# Patient Record
Sex: Female | Born: 1955 | Race: White | Hispanic: No | Smoking: Former smoker
Health system: Southern US, Community
[De-identification: ages and names within clinical notes are randomized; demographics above are authoritative.]

## PROBLEM LIST (undated history)

## (undated) DIAGNOSIS — K589 Irritable bowel syndrome without diarrhea: Secondary | ICD-10-CM

## (undated) DIAGNOSIS — Z8719 Personal history of other diseases of the digestive system: Secondary | ICD-10-CM

## (undated) DIAGNOSIS — F341 Dysthymic disorder: Secondary | ICD-10-CM

## (undated) DIAGNOSIS — K219 Gastro-esophageal reflux disease without esophagitis: Secondary | ICD-10-CM

## (undated) DIAGNOSIS — Z86711 Personal history of pulmonary embolism: Secondary | ICD-10-CM

## (undated) DIAGNOSIS — M1990 Unspecified osteoarthritis, unspecified site: Secondary | ICD-10-CM

## (undated) DIAGNOSIS — G8929 Other chronic pain: Secondary | ICD-10-CM

## (undated) DIAGNOSIS — F17200 Nicotine dependence, unspecified, uncomplicated: Secondary | ICD-10-CM

## (undated) DIAGNOSIS — J449 Chronic obstructive pulmonary disease, unspecified: Secondary | ICD-10-CM

## (undated) HISTORY — DX: Dysthymic disorder: F34.1

## (undated) HISTORY — DX: Other chronic pain: G89.29

## (undated) HISTORY — DX: Irritable bowel syndrome, unspecified: K58.9

## (undated) HISTORY — DX: Unspecified osteoarthritis, unspecified site: M19.90

## (undated) HISTORY — DX: Personal history of pulmonary embolism: Z86.711

## (undated) HISTORY — DX: Personal history of other diseases of the digestive system: Z87.19

## (undated) HISTORY — PX: CERVICAL SPINE SURGERY: SHX589

## (undated) HISTORY — DX: Chronic obstructive pulmonary disease, unspecified: J44.9

## (undated) HISTORY — DX: Gastro-esophageal reflux disease without esophagitis: K21.9

---

## 2000-01-07 ENCOUNTER — Encounter: Payer: Self-pay | Admitting: Family Medicine

## 2000-01-26 ENCOUNTER — Encounter: Admission: RE | Admit: 2000-01-26 | Discharge: 2000-01-26 | Payer: Self-pay | Admitting: Family Medicine

## 2000-01-26 ENCOUNTER — Encounter: Payer: Self-pay | Admitting: Family Medicine

## 2000-02-14 ENCOUNTER — Emergency Department (HOSPITAL_COMMUNITY): Admission: EM | Admit: 2000-02-14 | Discharge: 2000-02-14 | Payer: Self-pay | Admitting: Emergency Medicine

## 2000-02-15 ENCOUNTER — Encounter: Payer: Self-pay | Admitting: Emergency Medicine

## 2000-02-25 ENCOUNTER — Encounter: Payer: Self-pay | Admitting: Urology

## 2000-02-25 ENCOUNTER — Encounter: Admission: RE | Admit: 2000-02-25 | Discharge: 2000-02-25 | Payer: Self-pay | Admitting: Urology

## 2000-02-26 ENCOUNTER — Encounter: Payer: Self-pay | Admitting: Urology

## 2000-02-26 ENCOUNTER — Ambulatory Visit (HOSPITAL_COMMUNITY): Admission: RE | Admit: 2000-02-26 | Discharge: 2000-02-26 | Payer: Self-pay | Admitting: Urology

## 2000-10-01 ENCOUNTER — Encounter (INDEPENDENT_AMBULATORY_CARE_PROVIDER_SITE_OTHER): Payer: Self-pay | Admitting: Specialist

## 2000-10-01 ENCOUNTER — Other Ambulatory Visit: Admission: RE | Admit: 2000-10-01 | Discharge: 2000-10-01 | Payer: Self-pay | Admitting: *Deleted

## 2000-10-21 ENCOUNTER — Other Ambulatory Visit: Admission: RE | Admit: 2000-10-21 | Discharge: 2000-10-21 | Payer: Self-pay | Admitting: *Deleted

## 2001-01-17 ENCOUNTER — Emergency Department (HOSPITAL_COMMUNITY): Admission: EM | Admit: 2001-01-17 | Discharge: 2001-01-17 | Payer: Self-pay | Admitting: Emergency Medicine

## 2001-01-19 ENCOUNTER — Encounter (INDEPENDENT_AMBULATORY_CARE_PROVIDER_SITE_OTHER): Payer: Self-pay | Admitting: Specialist

## 2001-01-19 ENCOUNTER — Ambulatory Visit (HOSPITAL_COMMUNITY): Admission: RE | Admit: 2001-01-19 | Discharge: 2001-01-19 | Payer: Self-pay | Admitting: Gastroenterology

## 2002-01-16 ENCOUNTER — Encounter: Admission: RE | Admit: 2002-01-16 | Discharge: 2002-01-16 | Payer: Self-pay | Admitting: Family Medicine

## 2002-01-16 ENCOUNTER — Encounter: Payer: Self-pay | Admitting: Family Medicine

## 2002-01-22 ENCOUNTER — Encounter: Payer: Self-pay | Admitting: Emergency Medicine

## 2002-01-22 ENCOUNTER — Emergency Department (HOSPITAL_COMMUNITY): Admission: EM | Admit: 2002-01-22 | Discharge: 2002-01-22 | Payer: Self-pay | Admitting: Emergency Medicine

## 2002-01-24 ENCOUNTER — Encounter: Admission: RE | Admit: 2002-01-24 | Discharge: 2002-01-24 | Payer: Self-pay | Admitting: Family Medicine

## 2002-01-24 ENCOUNTER — Encounter: Payer: Self-pay | Admitting: Family Medicine

## 2002-06-29 ENCOUNTER — Ambulatory Visit (HOSPITAL_COMMUNITY): Admission: RE | Admit: 2002-06-29 | Discharge: 2002-06-30 | Payer: Self-pay | Admitting: Neurological Surgery

## 2002-06-29 ENCOUNTER — Encounter: Payer: Self-pay | Admitting: Neurological Surgery

## 2002-08-23 ENCOUNTER — Encounter: Admission: RE | Admit: 2002-08-23 | Discharge: 2002-09-13 | Payer: Self-pay | Admitting: Neurological Surgery

## 2003-02-02 ENCOUNTER — Encounter: Payer: Self-pay | Admitting: Family Medicine

## 2003-02-02 ENCOUNTER — Encounter: Admission: RE | Admit: 2003-02-02 | Discharge: 2003-02-02 | Payer: Self-pay | Admitting: Family Medicine

## 2003-03-07 ENCOUNTER — Ambulatory Visit (HOSPITAL_COMMUNITY): Admission: RE | Admit: 2003-03-07 | Discharge: 2003-03-07 | Payer: Self-pay | Admitting: Gastroenterology

## 2003-03-07 ENCOUNTER — Encounter: Payer: Self-pay | Admitting: Gastroenterology

## 2003-03-23 ENCOUNTER — Other Ambulatory Visit: Admission: RE | Admit: 2003-03-23 | Discharge: 2003-03-23 | Payer: Self-pay | Admitting: Family Medicine

## 2003-07-19 ENCOUNTER — Encounter: Admission: RE | Admit: 2003-07-19 | Discharge: 2003-07-19 | Payer: Self-pay | Admitting: Neurology

## 2003-07-19 ENCOUNTER — Encounter: Payer: Self-pay | Admitting: Neurology

## 2003-11-16 ENCOUNTER — Emergency Department (HOSPITAL_COMMUNITY): Admission: EM | Admit: 2003-11-16 | Discharge: 2003-11-16 | Payer: Self-pay | Admitting: Emergency Medicine

## 2003-11-19 ENCOUNTER — Encounter: Admission: RE | Admit: 2003-11-19 | Discharge: 2003-11-19 | Payer: Self-pay | Admitting: Family Medicine

## 2003-11-23 ENCOUNTER — Encounter: Admission: RE | Admit: 2003-11-23 | Discharge: 2004-02-21 | Payer: Self-pay | Admitting: Family Medicine

## 2004-04-29 ENCOUNTER — Encounter
Admission: RE | Admit: 2004-04-29 | Discharge: 2004-07-04 | Payer: Self-pay | Admitting: Physical Medicine and Rehabilitation

## 2004-07-04 ENCOUNTER — Encounter
Admission: RE | Admit: 2004-07-04 | Discharge: 2004-09-01 | Payer: Self-pay | Admitting: Physical Medicine and Rehabilitation

## 2004-07-09 ENCOUNTER — Ambulatory Visit: Payer: Self-pay | Admitting: Physical Medicine and Rehabilitation

## 2004-07-30 ENCOUNTER — Encounter: Admission: RE | Admit: 2004-07-30 | Discharge: 2004-07-30 | Payer: Self-pay | Admitting: Obstetrics and Gynecology

## 2004-09-01 ENCOUNTER — Encounter
Admission: RE | Admit: 2004-09-01 | Discharge: 2004-11-10 | Payer: Self-pay | Admitting: Physical Medicine and Rehabilitation

## 2004-09-12 IMAGING — US US SOFT TISSUE HEAD/NECK
1 series · 14 of 25 positions shown · non-contrast
Comparison: none

CLINICAL DATA: The patient has an enlarged thyroid.
 ULTRASOUND OF THE THYROID
 The right lobe measures 5.7 x 2.2 x 1.9 cm and the left lobe measures 5.6 x 1.8 x 1.8 cm.  There are small hypoechoic areas in both the right and left lobe.  The largest on the right measures 4 mm and the largest on the left measures up to 8-9 mm.  These are mostly cystic but with some solid component particularly on the left.
 IMPRESSION
 Multiple small nodules throughout both lobes of the thyroid compatible with a multinodular goiter.  Follow-up ultrasound would be suggested in six months.  If any of these nodules increases in size to 1 to 1.5 cm, biopsy would be suggested at that time.
 Correlation with the TSH level would also be suggested.

[Series 1: unknown · 0.08mm/px · 14 of 57 slices shown]
[im 1/57]
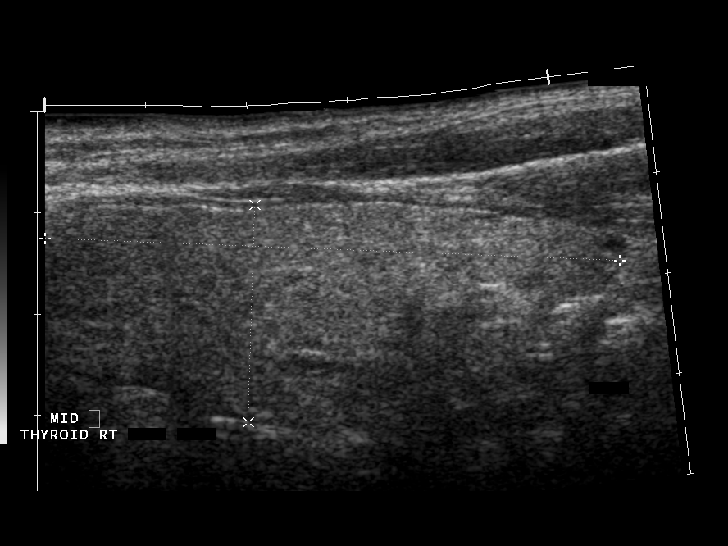
[im 5/57]
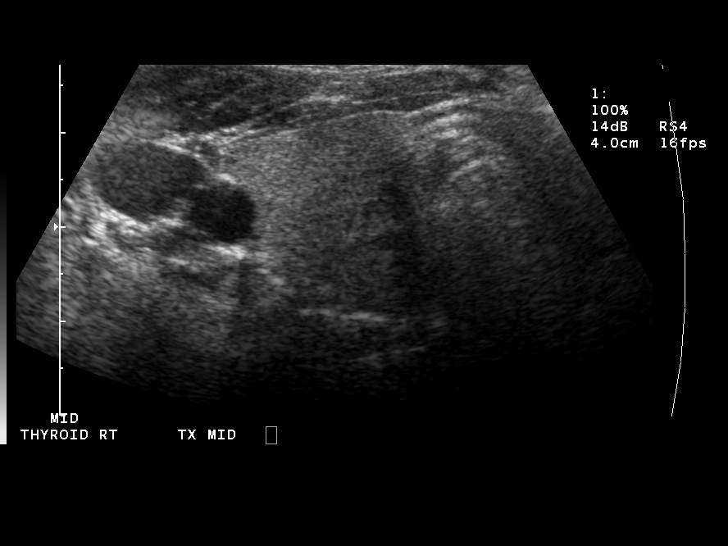
[im 10/57]
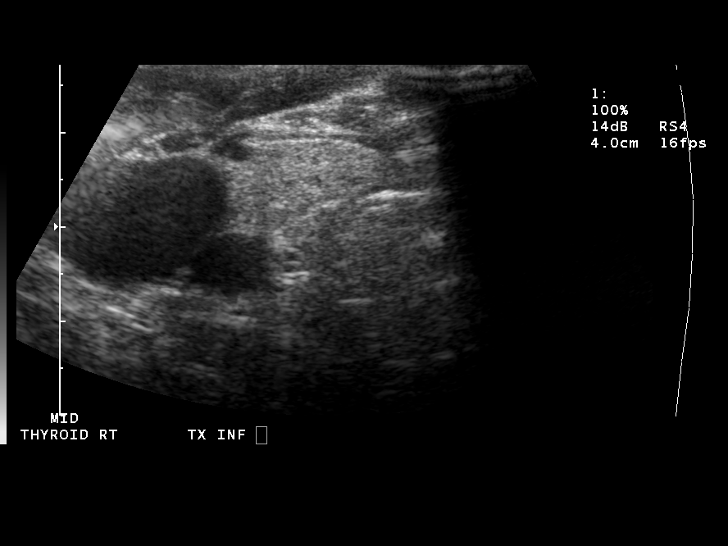
[im 15/57]
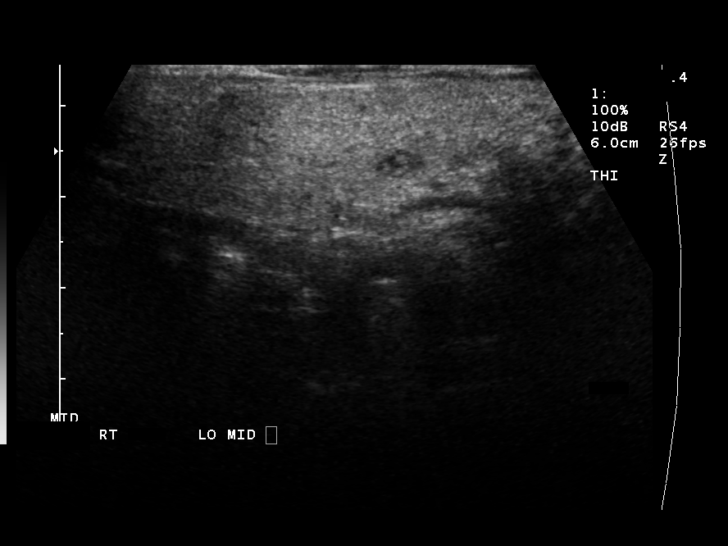
[im 19/57]
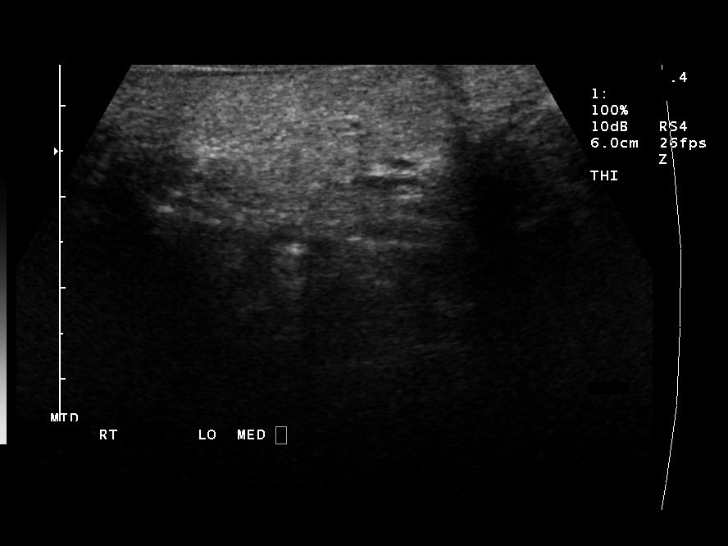
[im 22/57]
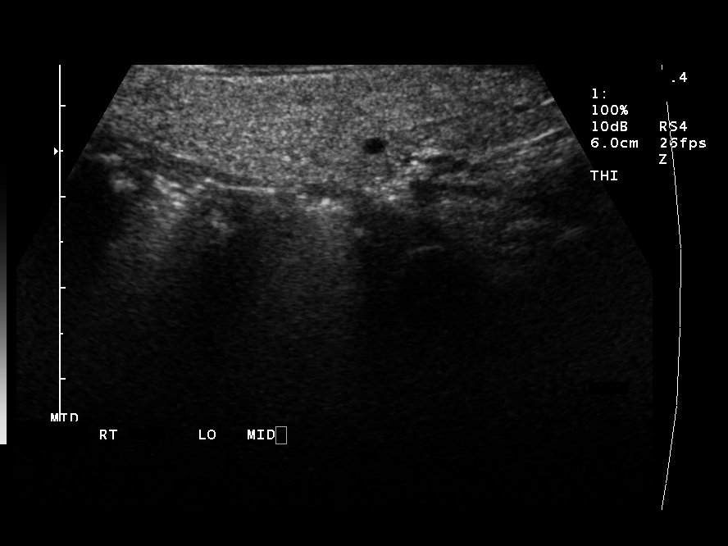
[im 26/57]
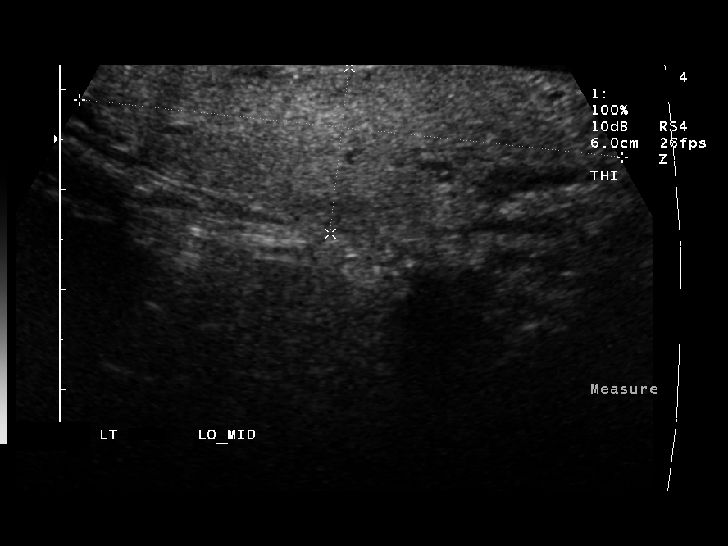
[im 31/57]
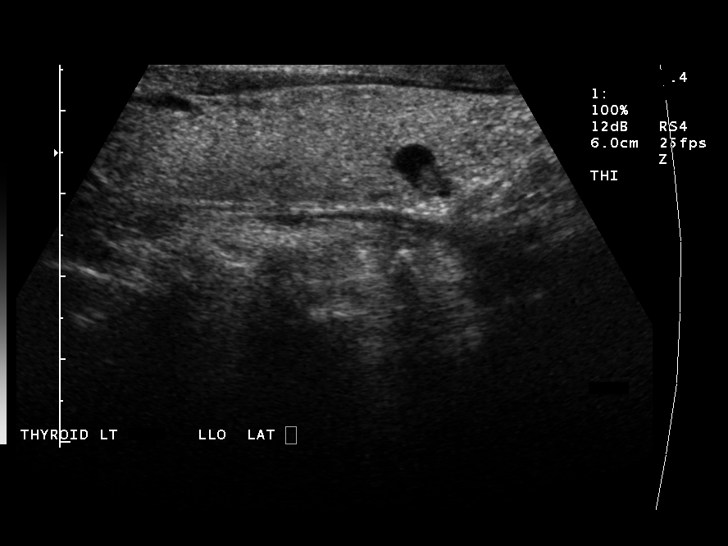
[im 36/57]
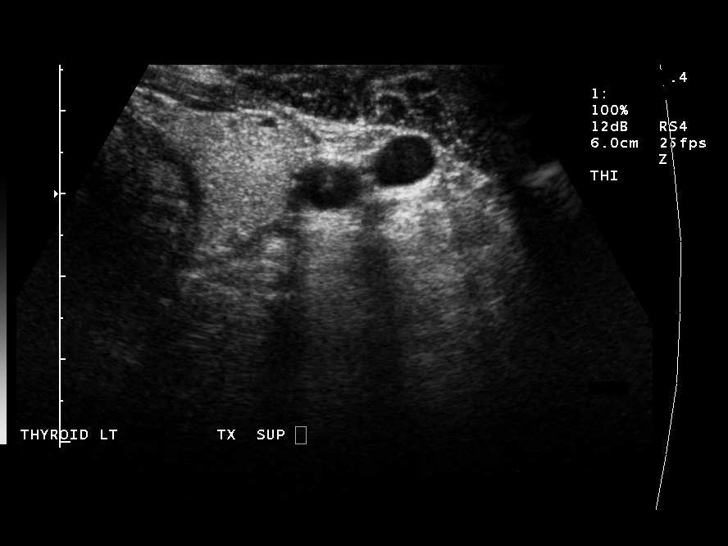
[im 38/57]
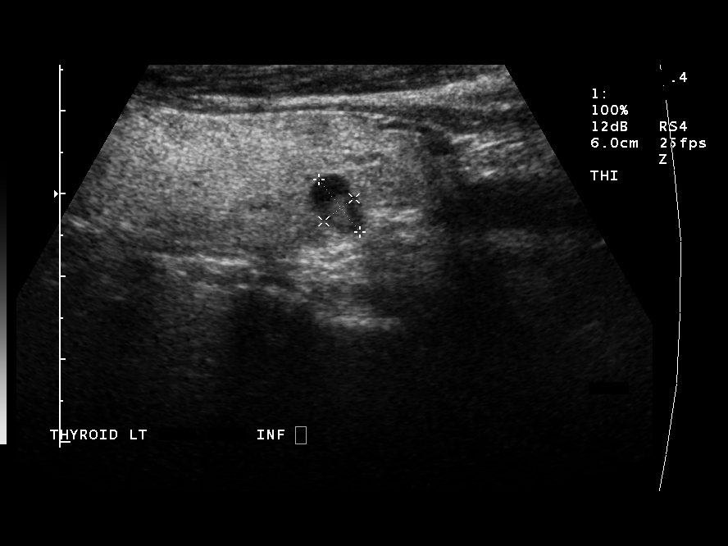
[im 43/57]
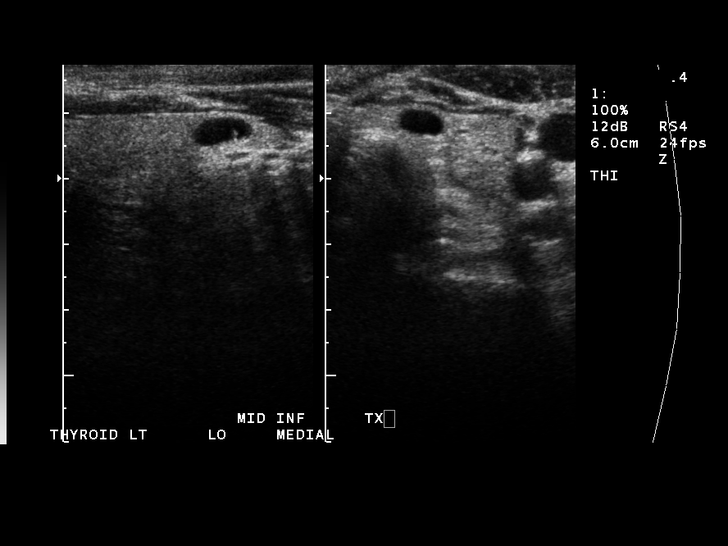
[im 47/57]
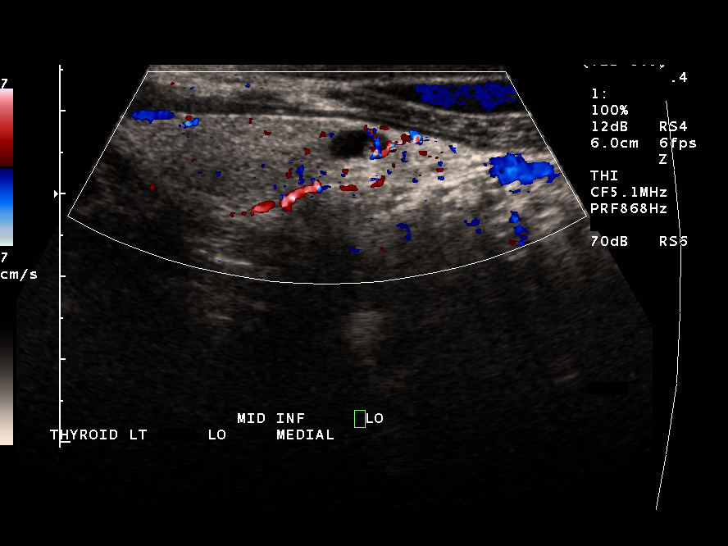
[im 52/57]
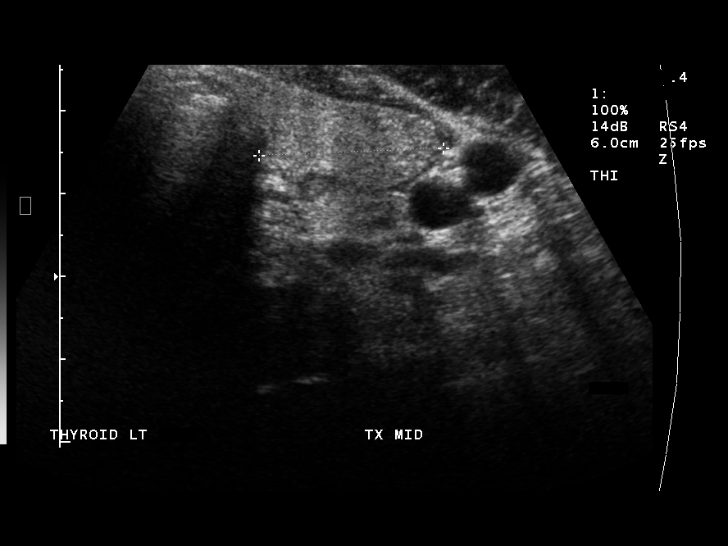
[im 57/57]
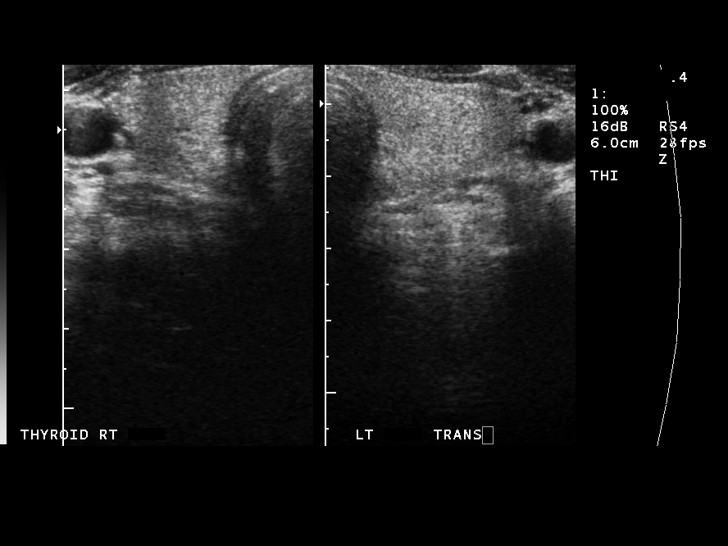

[14 of 25 positions shown; findings below may reference images not displayed]

## 2004-10-03 ENCOUNTER — Ambulatory Visit: Payer: Self-pay | Admitting: Physical Medicine and Rehabilitation

## 2004-10-31 ENCOUNTER — Ambulatory Visit (HOSPITAL_COMMUNITY): Admission: RE | Admit: 2004-10-31 | Discharge: 2004-10-31 | Payer: Self-pay | Admitting: *Deleted

## 2004-11-10 ENCOUNTER — Encounter
Admission: RE | Admit: 2004-11-10 | Discharge: 2005-02-08 | Payer: Self-pay | Admitting: Physical Medicine and Rehabilitation

## 2004-11-19 ENCOUNTER — Ambulatory Visit: Payer: Self-pay | Admitting: Physical Medicine and Rehabilitation

## 2004-12-31 ENCOUNTER — Ambulatory Visit: Payer: Self-pay | Admitting: Physical Medicine and Rehabilitation

## 2005-02-07 ENCOUNTER — Emergency Department (HOSPITAL_COMMUNITY): Admission: EM | Admit: 2005-02-07 | Discharge: 2005-02-07 | Payer: Self-pay | Admitting: Family Medicine

## 2005-02-23 ENCOUNTER — Inpatient Hospital Stay (HOSPITAL_COMMUNITY): Admission: EM | Admit: 2005-02-23 | Discharge: 2005-02-25 | Payer: Self-pay | Admitting: Emergency Medicine

## 2005-02-23 ENCOUNTER — Encounter
Admission: RE | Admit: 2005-02-23 | Discharge: 2005-03-10 | Payer: Self-pay | Admitting: Physical Medicine and Rehabilitation

## 2005-02-24 ENCOUNTER — Encounter (INDEPENDENT_AMBULATORY_CARE_PROVIDER_SITE_OTHER): Payer: Self-pay | Admitting: Cardiology

## 2005-02-27 ENCOUNTER — Inpatient Hospital Stay (HOSPITAL_COMMUNITY): Admission: EM | Admit: 2005-02-27 | Discharge: 2005-03-02 | Payer: Self-pay | Admitting: Psychiatry

## 2005-02-27 ENCOUNTER — Ambulatory Visit: Payer: Self-pay | Admitting: Psychiatry

## 2005-02-27 ENCOUNTER — Ambulatory Visit: Payer: Self-pay | Admitting: Physical Medicine and Rehabilitation

## 2005-04-09 ENCOUNTER — Ambulatory Visit: Payer: Self-pay | Admitting: Physical Medicine and Rehabilitation

## 2005-04-09 ENCOUNTER — Encounter
Admission: RE | Admit: 2005-04-09 | Discharge: 2005-07-08 | Payer: Self-pay | Admitting: Physical Medicine and Rehabilitation

## 2005-05-12 ENCOUNTER — Ambulatory Visit: Payer: Self-pay | Admitting: Physical Medicine and Rehabilitation

## 2005-07-09 ENCOUNTER — Ambulatory Visit: Payer: Self-pay | Admitting: Physical Medicine and Rehabilitation

## 2005-07-09 ENCOUNTER — Encounter
Admission: RE | Admit: 2005-07-09 | Discharge: 2005-10-07 | Payer: Self-pay | Admitting: Physical Medicine and Rehabilitation

## 2005-08-19 ENCOUNTER — Ambulatory Visit: Payer: Self-pay | Admitting: Physical Medicine and Rehabilitation

## 2005-08-21 ENCOUNTER — Encounter: Admission: RE | Admit: 2005-08-21 | Discharge: 2005-08-21 | Payer: Self-pay | Admitting: Internal Medicine

## 2005-09-03 ENCOUNTER — Ambulatory Visit (HOSPITAL_COMMUNITY)
Admission: RE | Admit: 2005-09-03 | Discharge: 2005-09-03 | Payer: Self-pay | Admitting: Physical Medicine and Rehabilitation

## 2005-10-01 ENCOUNTER — Ambulatory Visit: Payer: Self-pay | Admitting: Physical Medicine and Rehabilitation

## 2005-11-05 ENCOUNTER — Encounter
Admission: RE | Admit: 2005-11-05 | Discharge: 2006-02-03 | Payer: Self-pay | Admitting: Physical Medicine and Rehabilitation

## 2005-11-05 ENCOUNTER — Ambulatory Visit: Payer: Self-pay | Admitting: Physical Medicine and Rehabilitation

## 2005-11-17 ENCOUNTER — Encounter (INDEPENDENT_AMBULATORY_CARE_PROVIDER_SITE_OTHER): Payer: Self-pay | Admitting: Specialist

## 2005-11-17 ENCOUNTER — Ambulatory Visit (HOSPITAL_BASED_OUTPATIENT_CLINIC_OR_DEPARTMENT_OTHER): Admission: RE | Admit: 2005-11-17 | Discharge: 2005-11-17 | Payer: Self-pay | Admitting: Obstetrics and Gynecology

## 2005-12-29 ENCOUNTER — Ambulatory Visit: Payer: Self-pay | Admitting: Physical Medicine and Rehabilitation

## 2006-02-19 ENCOUNTER — Ambulatory Visit: Payer: Self-pay | Admitting: Physical Medicine and Rehabilitation

## 2006-02-19 ENCOUNTER — Encounter
Admission: RE | Admit: 2006-02-19 | Discharge: 2006-05-20 | Payer: Self-pay | Admitting: Physical Medicine and Rehabilitation

## 2006-03-25 ENCOUNTER — Ambulatory Visit: Payer: Self-pay | Admitting: Physical Medicine and Rehabilitation

## 2006-04-22 ENCOUNTER — Ambulatory Visit: Payer: Self-pay | Admitting: Physical Medicine and Rehabilitation

## 2006-05-18 ENCOUNTER — Encounter
Admission: RE | Admit: 2006-05-18 | Discharge: 2006-08-16 | Payer: Self-pay | Admitting: Physical Medicine and Rehabilitation

## 2006-06-15 ENCOUNTER — Ambulatory Visit: Payer: Self-pay | Admitting: Physical Medicine and Rehabilitation

## 2006-08-12 ENCOUNTER — Encounter
Admission: RE | Admit: 2006-08-12 | Discharge: 2006-11-10 | Payer: Self-pay | Admitting: Physical Medicine and Rehabilitation

## 2006-08-12 ENCOUNTER — Ambulatory Visit: Payer: Self-pay | Admitting: Physical Medicine and Rehabilitation

## 2006-08-23 ENCOUNTER — Encounter: Admission: RE | Admit: 2006-08-23 | Discharge: 2006-08-23 | Payer: Self-pay | Admitting: Obstetrics and Gynecology

## 2006-10-05 ENCOUNTER — Ambulatory Visit: Payer: Self-pay | Admitting: Physical Medicine and Rehabilitation

## 2006-10-14 ENCOUNTER — Ambulatory Visit: Payer: Self-pay | Admitting: Internal Medicine

## 2006-11-30 ENCOUNTER — Ambulatory Visit: Payer: Self-pay | Admitting: Physical Medicine and Rehabilitation

## 2006-11-30 ENCOUNTER — Encounter
Admission: RE | Admit: 2006-11-30 | Discharge: 2007-02-28 | Payer: Self-pay | Admitting: Physical Medicine and Rehabilitation

## 2006-12-31 ENCOUNTER — Ambulatory Visit (HOSPITAL_COMMUNITY)
Admission: RE | Admit: 2006-12-31 | Discharge: 2006-12-31 | Payer: Self-pay | Admitting: Physical Medicine and Rehabilitation

## 2007-01-25 ENCOUNTER — Ambulatory Visit: Payer: Self-pay | Admitting: Physical Medicine and Rehabilitation

## 2007-03-29 ENCOUNTER — Encounter
Admission: RE | Admit: 2007-03-29 | Discharge: 2007-06-27 | Payer: Self-pay | Admitting: Physical Medicine and Rehabilitation

## 2007-03-29 ENCOUNTER — Ambulatory Visit: Payer: Self-pay | Admitting: Physical Medicine and Rehabilitation

## 2007-04-01 ENCOUNTER — Ambulatory Visit (HOSPITAL_COMMUNITY)
Admission: RE | Admit: 2007-04-01 | Discharge: 2007-04-01 | Payer: Self-pay | Admitting: Physical Medicine and Rehabilitation

## 2007-04-10 ENCOUNTER — Emergency Department (HOSPITAL_COMMUNITY): Admission: EM | Admit: 2007-04-10 | Discharge: 2007-04-10 | Payer: Self-pay | Admitting: Emergency Medicine

## 2007-05-24 ENCOUNTER — Ambulatory Visit: Payer: Self-pay | Admitting: Physical Medicine and Rehabilitation

## 2007-07-19 ENCOUNTER — Ambulatory Visit: Payer: Self-pay | Admitting: Physical Medicine and Rehabilitation

## 2007-07-19 ENCOUNTER — Encounter
Admission: RE | Admit: 2007-07-19 | Discharge: 2007-10-17 | Payer: Self-pay | Admitting: Physical Medicine and Rehabilitation

## 2007-08-25 ENCOUNTER — Encounter: Admission: RE | Admit: 2007-08-25 | Discharge: 2007-08-25 | Payer: Self-pay | Admitting: Family Medicine

## 2007-09-07 ENCOUNTER — Ambulatory Visit: Payer: Self-pay | Admitting: Physical Medicine and Rehabilitation

## 2007-10-04 ENCOUNTER — Encounter
Admission: RE | Admit: 2007-10-04 | Discharge: 2008-01-02 | Payer: Self-pay | Admitting: Physical Medicine and Rehabilitation

## 2007-10-25 ENCOUNTER — Ambulatory Visit: Payer: Self-pay | Admitting: Physical Medicine and Rehabilitation

## 2007-11-03 HISTORY — PX: FOOT SURGERY: SHX648

## 2007-12-02 ENCOUNTER — Ambulatory Visit: Payer: Self-pay | Admitting: Physical Medicine and Rehabilitation

## 2007-12-02 ENCOUNTER — Encounter
Admission: RE | Admit: 2007-12-02 | Discharge: 2008-01-04 | Payer: Self-pay | Admitting: Physical Medicine and Rehabilitation

## 2007-12-14 ENCOUNTER — Encounter
Admission: RE | Admit: 2007-12-14 | Discharge: 2007-12-14 | Payer: Self-pay | Admitting: Physical Medicine and Rehabilitation

## 2008-01-03 ENCOUNTER — Ambulatory Visit: Payer: Self-pay | Admitting: Physical Medicine and Rehabilitation

## 2008-01-31 ENCOUNTER — Encounter
Admission: RE | Admit: 2008-01-31 | Discharge: 2008-02-02 | Payer: Self-pay | Admitting: Physical Medicine and Rehabilitation

## 2008-02-02 ENCOUNTER — Ambulatory Visit: Payer: Self-pay | Admitting: Physical Medicine & Rehabilitation

## 2008-03-01 ENCOUNTER — Encounter
Admission: RE | Admit: 2008-03-01 | Discharge: 2008-05-30 | Payer: Self-pay | Admitting: Physical Medicine & Rehabilitation

## 2008-03-01 ENCOUNTER — Ambulatory Visit: Payer: Self-pay | Admitting: Physical Medicine & Rehabilitation

## 2008-03-29 ENCOUNTER — Ambulatory Visit: Payer: Self-pay | Admitting: Physical Medicine & Rehabilitation

## 2008-04-05 ENCOUNTER — Ambulatory Visit: Payer: Self-pay | Admitting: Physical Medicine & Rehabilitation

## 2008-04-25 ENCOUNTER — Encounter
Admission: RE | Admit: 2008-04-25 | Discharge: 2008-07-24 | Payer: Self-pay | Admitting: Physical Medicine and Rehabilitation

## 2008-04-27 ENCOUNTER — Ambulatory Visit: Payer: Self-pay | Admitting: Physical Medicine and Rehabilitation

## 2008-04-30 ENCOUNTER — Ambulatory Visit (HOSPITAL_COMMUNITY)
Admission: RE | Admit: 2008-04-30 | Discharge: 2008-04-30 | Payer: Self-pay | Admitting: Physical Medicine and Rehabilitation

## 2008-05-30 ENCOUNTER — Ambulatory Visit: Payer: Self-pay | Admitting: Physical Medicine and Rehabilitation

## 2008-06-27 ENCOUNTER — Ambulatory Visit: Payer: Self-pay | Admitting: Physical Medicine and Rehabilitation

## 2008-07-25 ENCOUNTER — Encounter
Admission: RE | Admit: 2008-07-25 | Discharge: 2008-10-17 | Payer: Self-pay | Admitting: Physical Medicine and Rehabilitation

## 2008-07-25 ENCOUNTER — Ambulatory Visit: Payer: Self-pay | Admitting: Physical Medicine and Rehabilitation

## 2008-08-24 ENCOUNTER — Ambulatory Visit: Payer: Self-pay | Admitting: Physical Medicine and Rehabilitation

## 2008-08-31 ENCOUNTER — Encounter: Admission: RE | Admit: 2008-08-31 | Discharge: 2008-08-31 | Payer: Self-pay | Admitting: Obstetrics and Gynecology

## 2008-09-04 ENCOUNTER — Encounter
Admission: RE | Admit: 2008-09-04 | Discharge: 2008-11-01 | Payer: Self-pay | Admitting: Physical Medicine and Rehabilitation

## 2008-09-26 ENCOUNTER — Ambulatory Visit: Payer: Self-pay | Admitting: Physical Medicine and Rehabilitation

## 2008-10-17 ENCOUNTER — Encounter
Admission: RE | Admit: 2008-10-17 | Discharge: 2009-01-15 | Payer: Self-pay | Admitting: Physical Medicine & Rehabilitation

## 2008-10-23 ENCOUNTER — Emergency Department (HOSPITAL_COMMUNITY): Admission: EM | Admit: 2008-10-23 | Discharge: 2008-10-23 | Payer: Self-pay | Admitting: Emergency Medicine

## 2008-10-24 ENCOUNTER — Ambulatory Visit: Payer: Self-pay | Admitting: Physical Medicine and Rehabilitation

## 2008-11-05 LAB — BASIC METABOLIC PANEL
BUN: 5 mg/dL (ref 4–21)
Potassium: 3 mmol/L — AB (ref 3.4–5.3)

## 2008-11-21 ENCOUNTER — Ambulatory Visit: Payer: Self-pay | Admitting: Physical Medicine and Rehabilitation

## 2008-11-22 ENCOUNTER — Ambulatory Visit (HOSPITAL_COMMUNITY)
Admission: RE | Admit: 2008-11-22 | Discharge: 2008-11-22 | Payer: Self-pay | Admitting: Physical Medicine and Rehabilitation

## 2008-11-28 ENCOUNTER — Ambulatory Visit (HOSPITAL_COMMUNITY)
Admission: RE | Admit: 2008-11-28 | Discharge: 2008-11-28 | Payer: Self-pay | Admitting: Physical Medicine and Rehabilitation

## 2008-12-19 ENCOUNTER — Ambulatory Visit: Payer: Self-pay | Admitting: Physical Medicine and Rehabilitation

## 2009-01-15 ENCOUNTER — Encounter
Admission: RE | Admit: 2009-01-15 | Discharge: 2009-04-15 | Payer: Self-pay | Admitting: Physical Medicine and Rehabilitation

## 2009-01-16 ENCOUNTER — Ambulatory Visit: Payer: Self-pay | Admitting: Physical Medicine and Rehabilitation

## 2009-02-20 ENCOUNTER — Ambulatory Visit: Payer: Self-pay | Admitting: Physical Medicine and Rehabilitation

## 2009-03-08 ENCOUNTER — Encounter: Admission: RE | Admit: 2009-03-08 | Discharge: 2009-03-08 | Payer: Self-pay | Admitting: Endocrinology

## 2009-03-22 ENCOUNTER — Ambulatory Visit: Payer: Self-pay | Admitting: Physical Medicine and Rehabilitation

## 2009-04-16 ENCOUNTER — Encounter
Admission: RE | Admit: 2009-04-16 | Discharge: 2009-07-15 | Payer: Self-pay | Admitting: Physical Medicine and Rehabilitation

## 2009-04-23 ENCOUNTER — Ambulatory Visit: Payer: Self-pay | Admitting: Physical Medicine and Rehabilitation

## 2009-05-21 ENCOUNTER — Ambulatory Visit: Payer: Self-pay | Admitting: Physical Medicine and Rehabilitation

## 2009-06-21 ENCOUNTER — Ambulatory Visit: Payer: Self-pay | Admitting: Physical Medicine and Rehabilitation

## 2009-07-15 ENCOUNTER — Encounter
Admission: RE | Admit: 2009-07-15 | Discharge: 2009-10-13 | Payer: Self-pay | Admitting: Physical Medicine and Rehabilitation

## 2009-07-17 ENCOUNTER — Ambulatory Visit: Payer: Self-pay | Admitting: Physical Medicine and Rehabilitation

## 2009-07-30 ENCOUNTER — Ambulatory Visit (HOSPITAL_COMMUNITY)
Admission: RE | Admit: 2009-07-30 | Discharge: 2009-07-30 | Payer: Self-pay | Admitting: Physical Medicine and Rehabilitation

## 2009-08-14 ENCOUNTER — Ambulatory Visit: Payer: Self-pay | Admitting: Physical Medicine and Rehabilitation

## 2009-09-16 ENCOUNTER — Ambulatory Visit: Payer: Self-pay | Admitting: Physical Medicine and Rehabilitation

## 2009-09-17 ENCOUNTER — Encounter: Admission: RE | Admit: 2009-09-17 | Discharge: 2009-09-17 | Payer: Self-pay | Admitting: Family Medicine

## 2009-10-10 ENCOUNTER — Encounter
Admission: RE | Admit: 2009-10-10 | Discharge: 2009-10-24 | Payer: Self-pay | Admitting: Physical Medicine and Rehabilitation

## 2009-10-14 ENCOUNTER — Ambulatory Visit: Payer: Self-pay | Admitting: Physical Medicine and Rehabilitation

## 2009-11-02 LAB — HM MAMMOGRAPHY: HM Mammogram: NORMAL

## 2009-11-06 ENCOUNTER — Encounter: Admission: RE | Admit: 2009-11-06 | Discharge: 2009-11-06 | Payer: Self-pay | Admitting: Endocrinology

## 2009-11-13 ENCOUNTER — Encounter
Admission: RE | Admit: 2009-11-13 | Discharge: 2010-02-11 | Payer: Self-pay | Admitting: Physical Medicine and Rehabilitation

## 2009-11-15 ENCOUNTER — Ambulatory Visit: Payer: Self-pay | Admitting: Physical Medicine and Rehabilitation

## 2009-12-11 ENCOUNTER — Ambulatory Visit: Payer: Self-pay | Admitting: Physical Medicine and Rehabilitation

## 2010-01-08 ENCOUNTER — Ambulatory Visit: Payer: Self-pay | Admitting: Physical Medicine and Rehabilitation

## 2010-02-05 ENCOUNTER — Ambulatory Visit: Payer: Self-pay | Admitting: Physical Medicine and Rehabilitation

## 2010-02-08 ENCOUNTER — Emergency Department (HOSPITAL_COMMUNITY): Admission: EM | Admit: 2010-02-08 | Discharge: 2010-02-08 | Payer: Self-pay | Admitting: Family Medicine

## 2010-02-09 ENCOUNTER — Emergency Department (HOSPITAL_COMMUNITY): Admission: EM | Admit: 2010-02-09 | Discharge: 2010-02-09 | Payer: Self-pay | Admitting: Family Medicine

## 2010-03-10 ENCOUNTER — Encounter
Admission: RE | Admit: 2010-03-10 | Discharge: 2010-06-08 | Payer: Self-pay | Admitting: Physical Medicine and Rehabilitation

## 2010-03-12 ENCOUNTER — Ambulatory Visit: Payer: Self-pay | Admitting: Physical Medicine and Rehabilitation

## 2010-04-16 ENCOUNTER — Ambulatory Visit: Payer: Self-pay | Admitting: Physical Medicine and Rehabilitation

## 2010-05-21 ENCOUNTER — Ambulatory Visit: Payer: Self-pay | Admitting: Physical Medicine and Rehabilitation

## 2010-06-02 LAB — LIPID PANEL: HDL: 41 mg/dL (ref 35–70)

## 2010-06-11 ENCOUNTER — Encounter
Admission: RE | Admit: 2010-06-11 | Discharge: 2010-08-01 | Payer: Self-pay | Admitting: Physical Medicine and Rehabilitation

## 2010-06-18 ENCOUNTER — Ambulatory Visit: Payer: Self-pay | Admitting: Physical Medicine and Rehabilitation

## 2010-07-03 LAB — HM COLONOSCOPY

## 2010-08-01 ENCOUNTER — Ambulatory Visit: Payer: Self-pay | Admitting: Physical Medicine and Rehabilitation

## 2010-08-26 ENCOUNTER — Encounter
Admission: RE | Admit: 2010-08-26 | Discharge: 2010-11-24 | Payer: Self-pay | Source: Home / Self Care | Attending: Physical Medicine and Rehabilitation | Admitting: Physical Medicine and Rehabilitation

## 2010-09-01 ENCOUNTER — Ambulatory Visit: Payer: Self-pay | Admitting: Physical Medicine and Rehabilitation

## 2010-09-18 ENCOUNTER — Encounter: Admission: RE | Admit: 2010-09-18 | Discharge: 2010-09-18 | Payer: Self-pay | Admitting: Family Medicine

## 2010-10-01 ENCOUNTER — Ambulatory Visit: Payer: Self-pay | Admitting: Physical Medicine and Rehabilitation

## 2010-10-28 ENCOUNTER — Ambulatory Visit: Payer: Self-pay | Admitting: Physical Medicine and Rehabilitation

## 2010-12-05 ENCOUNTER — Ambulatory Visit: Admit: 2010-12-05 | Payer: Self-pay | Admitting: Physical Medicine and Rehabilitation

## 2010-12-05 ENCOUNTER — Ambulatory Visit (HOSPITAL_BASED_OUTPATIENT_CLINIC_OR_DEPARTMENT_OTHER): Payer: Medicare Other | Admitting: Physical Medicine and Rehabilitation

## 2010-12-05 ENCOUNTER — Ambulatory Visit: Payer: Medicare Other | Attending: Physical Medicine and Rehabilitation

## 2010-12-05 DIAGNOSIS — M545 Low back pain, unspecified: Secondary | ICD-10-CM

## 2010-12-05 DIAGNOSIS — F411 Generalized anxiety disorder: Secondary | ICD-10-CM | POA: Insufficient documentation

## 2010-12-05 DIAGNOSIS — F329 Major depressive disorder, single episode, unspecified: Secondary | ICD-10-CM | POA: Insufficient documentation

## 2010-12-05 DIAGNOSIS — M79609 Pain in unspecified limb: Secondary | ICD-10-CM

## 2010-12-05 DIAGNOSIS — R5381 Other malaise: Secondary | ICD-10-CM | POA: Insufficient documentation

## 2010-12-05 DIAGNOSIS — M5137 Other intervertebral disc degeneration, lumbosacral region: Secondary | ICD-10-CM | POA: Insufficient documentation

## 2010-12-05 DIAGNOSIS — R209 Unspecified disturbances of skin sensation: Secondary | ICD-10-CM | POA: Insufficient documentation

## 2010-12-05 DIAGNOSIS — F3289 Other specified depressive episodes: Secondary | ICD-10-CM | POA: Insufficient documentation

## 2010-12-05 DIAGNOSIS — M51379 Other intervertebral disc degeneration, lumbosacral region without mention of lumbar back pain or lower extremity pain: Secondary | ICD-10-CM | POA: Insufficient documentation

## 2010-12-05 DIAGNOSIS — M47817 Spondylosis without myelopathy or radiculopathy, lumbosacral region: Secondary | ICD-10-CM | POA: Insufficient documentation

## 2010-12-05 DIAGNOSIS — Z981 Arthrodesis status: Secondary | ICD-10-CM | POA: Insufficient documentation

## 2010-12-05 DIAGNOSIS — Z79899 Other long term (current) drug therapy: Secondary | ICD-10-CM | POA: Insufficient documentation

## 2010-12-05 DIAGNOSIS — M5030 Other cervical disc degeneration, unspecified cervical region: Secondary | ICD-10-CM | POA: Insufficient documentation

## 2010-12-05 DIAGNOSIS — M542 Cervicalgia: Secondary | ICD-10-CM

## 2010-12-05 DIAGNOSIS — G894 Chronic pain syndrome: Secondary | ICD-10-CM

## 2010-12-05 DIAGNOSIS — R5383 Other fatigue: Secondary | ICD-10-CM | POA: Insufficient documentation

## 2010-12-05 DIAGNOSIS — R262 Difficulty in walking, not elsewhere classified: Secondary | ICD-10-CM | POA: Insufficient documentation

## 2010-12-30 ENCOUNTER — Ambulatory Visit (HOSPITAL_BASED_OUTPATIENT_CLINIC_OR_DEPARTMENT_OTHER): Payer: Medicare Other

## 2010-12-30 ENCOUNTER — Encounter: Payer: Medicare Other | Attending: Physical Medicine and Rehabilitation

## 2010-12-30 DIAGNOSIS — M47817 Spondylosis without myelopathy or radiculopathy, lumbosacral region: Secondary | ICD-10-CM | POA: Insufficient documentation

## 2010-12-30 DIAGNOSIS — M545 Low back pain, unspecified: Secondary | ICD-10-CM | POA: Insufficient documentation

## 2010-12-30 DIAGNOSIS — M51379 Other intervertebral disc degeneration, lumbosacral region without mention of lumbar back pain or lower extremity pain: Secondary | ICD-10-CM | POA: Insufficient documentation

## 2010-12-30 DIAGNOSIS — M79609 Pain in unspecified limb: Secondary | ICD-10-CM | POA: Insufficient documentation

## 2010-12-30 DIAGNOSIS — M47812 Spondylosis without myelopathy or radiculopathy, cervical region: Secondary | ICD-10-CM

## 2010-12-30 DIAGNOSIS — G894 Chronic pain syndrome: Secondary | ICD-10-CM

## 2010-12-30 DIAGNOSIS — Z981 Arthrodesis status: Secondary | ICD-10-CM | POA: Insufficient documentation

## 2010-12-30 DIAGNOSIS — M5137 Other intervertebral disc degeneration, lumbosacral region: Secondary | ICD-10-CM | POA: Insufficient documentation

## 2010-12-30 DIAGNOSIS — M5030 Other cervical disc degeneration, unspecified cervical region: Secondary | ICD-10-CM | POA: Insufficient documentation

## 2010-12-30 DIAGNOSIS — M129 Arthropathy, unspecified: Secondary | ICD-10-CM | POA: Insufficient documentation

## 2010-12-30 DIAGNOSIS — M25529 Pain in unspecified elbow: Secondary | ICD-10-CM

## 2011-01-21 LAB — WOUND CULTURE

## 2011-01-30 ENCOUNTER — Encounter (HOSPITAL_BASED_OUTPATIENT_CLINIC_OR_DEPARTMENT_OTHER): Payer: Medicare Other | Admitting: Physical Medicine and Rehabilitation

## 2011-01-30 ENCOUNTER — Encounter: Payer: Medicare Other | Attending: Physical Medicine and Rehabilitation

## 2011-01-30 DIAGNOSIS — M542 Cervicalgia: Secondary | ICD-10-CM

## 2011-01-30 DIAGNOSIS — M545 Low back pain, unspecified: Secondary | ICD-10-CM | POA: Insufficient documentation

## 2011-01-30 DIAGNOSIS — M79609 Pain in unspecified limb: Secondary | ICD-10-CM

## 2011-01-30 DIAGNOSIS — R209 Unspecified disturbances of skin sensation: Secondary | ICD-10-CM | POA: Insufficient documentation

## 2011-01-30 DIAGNOSIS — M51379 Other intervertebral disc degeneration, lumbosacral region without mention of lumbar back pain or lower extremity pain: Secondary | ICD-10-CM | POA: Insufficient documentation

## 2011-01-30 DIAGNOSIS — Z79899 Other long term (current) drug therapy: Secondary | ICD-10-CM | POA: Insufficient documentation

## 2011-01-30 DIAGNOSIS — M5137 Other intervertebral disc degeneration, lumbosacral region: Secondary | ICD-10-CM | POA: Insufficient documentation

## 2011-01-30 DIAGNOSIS — M6281 Muscle weakness (generalized): Secondary | ICD-10-CM | POA: Insufficient documentation

## 2011-01-30 DIAGNOSIS — F17200 Nicotine dependence, unspecified, uncomplicated: Secondary | ICD-10-CM | POA: Insufficient documentation

## 2011-01-30 DIAGNOSIS — Z981 Arthrodesis status: Secondary | ICD-10-CM | POA: Insufficient documentation

## 2011-01-30 DIAGNOSIS — M129 Arthropathy, unspecified: Secondary | ICD-10-CM | POA: Insufficient documentation

## 2011-01-30 DIAGNOSIS — G894 Chronic pain syndrome: Secondary | ICD-10-CM

## 2011-02-04 ENCOUNTER — Other Ambulatory Visit: Payer: Self-pay | Admitting: Physical Medicine and Rehabilitation

## 2011-02-04 DIAGNOSIS — M542 Cervicalgia: Secondary | ICD-10-CM

## 2011-02-12 ENCOUNTER — Ambulatory Visit (HOSPITAL_COMMUNITY)
Admission: RE | Admit: 2011-02-12 | Discharge: 2011-02-12 | Disposition: A | Discharge status: Home / Self Care | Payer: Medicare Other | Source: Ambulatory Visit | Attending: Physical Medicine and Rehabilitation | Admitting: Physical Medicine and Rehabilitation

## 2011-02-12 DIAGNOSIS — R209 Unspecified disturbances of skin sensation: Secondary | ICD-10-CM | POA: Insufficient documentation

## 2011-02-12 DIAGNOSIS — Z981 Arthrodesis status: Secondary | ICD-10-CM | POA: Insufficient documentation

## 2011-02-12 DIAGNOSIS — M5020 Other cervical disc displacement, unspecified cervical region: Secondary | ICD-10-CM | POA: Insufficient documentation

## 2011-02-12 DIAGNOSIS — M542 Cervicalgia: Secondary | ICD-10-CM | POA: Insufficient documentation

## 2011-02-12 DIAGNOSIS — R29898 Other symptoms and signs involving the musculoskeletal system: Secondary | ICD-10-CM | POA: Insufficient documentation

## 2011-03-04 ENCOUNTER — Encounter: Payer: Medicare Other | Admitting: Neurosurgery

## 2011-03-04 ENCOUNTER — Ambulatory Visit: Payer: Self-pay | Admitting: Physical Medicine and Rehabilitation

## 2011-03-17 NOTE — Assessment & Plan Note (Signed)
Allison Lee is a 55 year old, married woman, who has been followed in our  [facility] for multiple chronic pain complaints  including cervicalgia and lumbago with concomitant left arm and left leg  pain.   She recently saw Dr. Channing Mutters.  She states Dr. Channing Mutters felt she would have  surgical options through his office; however, Allison Lee would prefer to  continue medical management at this time.   Her average pain is about 8 on a scale of 10.  Pain is described as  sharp, burning, stabbing, tingling, aching in nature interfering  significantly with general activity.  Her sleep is poor.  Pain is worse  with walking, bending, sitting, anything that causes her to use her  arms.  They make her neck worse.   She states her pain improves with rest on medications.  She gets little  relief from current meds prescribed to the clinic.   MEDICATIONS:  Through this clinic throughout Pain and Rehabilitative  Clinic include:  1. MS Contin 30 mg 1 p.o. b.i.d.  2. Ultracet 1 p.o. t.i.d. p.r.n.   Functional status is __________.   She is able to walk about 2 to 3 minutes at a time.  She is able to  climb stairs.  She is able to drive.  She is independent with feeding  and toileting, occasionally needs assistance with dressing and bathing,  meal prep, household duties, and shopping.   REVIEW OF SYSTEMS:  Otherwise unchanged.  She does have depression and  anxiety.  Denies suicidal ideation.   Current physicians, which are involved in her care include Dr. Audria Nine,  Dr. Wynonia Lawman and Dr. Saul Fordyce, and Dr. Channing Mutters.   No changes in past medical, social, and family history since last visit.   SOCIAL HISTORY:  Remarkable for recent death of her mother and  apparently husband's father was recently diagnosed with pancreatic  cancer, which is causing some stress on the family again.  She does  smoke pack of cigarettes a day.   No other changes in family history since last visit.   PHYSICAL  EXAMINATION:  VITAL SIGNS:  Blood pressure is 101/56, pulse  108, respiration 18, and 96% saturate on room air.  GENERAL:  She is well-developed, well-nourished woman, who does not  appear in any distress.  She does appear a bit depressed.  She is alert,  cooperative, pleasant, and follows commands easily.  She is oriented x3.  NEUROLOGIC:  Cranial nerves are grossly intact.  Coordination is intact.  Reflexes are 2+ patellar and Achilles tendons.  No abnormal tone is  noted.  No clonus is noted.  No tremors or fasciculations are  appreciated.   She does have sensory changes on the left upper and left lower extremity  with light touch, patchy.   Limitations are noted in cervical range of motion as well as lumbar  range of motion in all planes.  Tenderness throughout the cervical  periscapular region and lumbar paraspinal region.   Gait is stable, but low.  Tandem gait and Romberg test are performed  adequately.  Balance is not impaired.   IMPRESSION:  1. Lumbar degenerative disc change and facet arthropathy with left leg      pain.  2. Suggestion of left S1 root irritation for MRI appears to be overall      improved after recent lumbar injection.  3. Status post C5-C6 fusion with chronic cervicalgia.  4. Left upper extremity pain secondary to above.   Ms.  Lee does have a history of depression anxiety and she is followed  by her psychiatrist and has a Veterinary surgeon, who she follows up with.   PLAN:  We will refill MS Contin 30 mg one p.o. b.i.d.  No refills as  well.  She does not need any refill on her Ultracet today.   Allison Lee does not exhibit any evidence of aberrant behavior as she  takes the medications as prescribed.  She currently does not have any  problems over sedation and constipation.  I will see her back in month.           ______________________________  Brantley Stage, M.D.     DMK/MedQ  D:  08/24/2008 13:31:27  T:  08/25/2008 02:33:16  Job #:   [id number]   cc:   Dr. Audria Nine

## 2011-03-17 NOTE — Assessment & Plan Note (Signed)
Allison Lee is a pleasant married 55 year old woman who is followed in  our Pain [facility] Rehabilitative Clinic for multiple chronic pain complaints.  She is a patient of Nadyne Coombes and also followed by Dr. Wynonia Lawman.   Allison Lee was last seen on December 19, 2008.  She is being followed in  our Pain and Rehabilitative Clinic for chronic neck pain.  She has known  cervical spondylosis and a fusion at C5-6.  She also has degenerative  disk disease, fact arthropathy, and neuropathic left lower extremity  pain.   She is back in today for refill of her morphine and her Flexeril.   Her average pain is about 10 on a scale of 10, described as constant,  tingling, aching, stabbing, sharp, and burning, localized to the  posterior cervical region, radiating through the scapula, somewhat down  the left upper extremity.  Low back pain is also noted radiating down  the left lower extremity, predominantly.   Pain is intact.  Sleep negatively.  She reports worsened pain with  walking, bending, sitting, standing, improves with rest and medication.  She gets a little relief with current meds.   He has multiple drug intolerances and allergies, which include a non-  steroidal anti-inflammatory intolerance.  She gets ischemic colitis.  METHADONE causes excessive swelling, HYDROCODONE causes nausea, LYRICA  makes her dizzy, HYDROCHLOROTHIAZIDE gives her a rash, NEURONTIN gives  her edema, and TOPAMAX has a negative impact on her bone density.   FUNCTIONAL STATUS:  She walks between 5 and 15 minutes at a time.  She  is able to climb stairs.  She drives.  She is independent with self-care  with the exception of dressing and bathing.  She has been able to engage  in some functional activities outside the home including feeding her  animals, she feeds her 3 horses, 3 dogs, and multiple rabbits and ducks  each morning.   No new changes with respect to review of systems.  She admits to  depression and anxiety,  but denies suicidal ideation.   No changes in past medical, social, or family history since last visit.   Medications, which are provided through this clinic include:  1. Flexeril 5 mg 1 p.o. b.i.d. p.r.n.  2. Ultracet up to 3 times a day p.r.n., however, she has decreased the      use of her Ultracet, now she is taking 1 per day since she started      on citalopram.  3. She is on morphine 30 mg 1 p.o. b.i.d.   PHYSICAL EXAMINATION:  VITAL SIGNS:  Today blood pressure is 110/70,  pulse 98, respiration 18, and 98% saturated on room air.  GENERAL:  She is well-developed, well-nourished woman who appears her  stated age.  Does not appear in any distress.  NEUROLOGIC:  She is oriented x3.  Speech is clear.  Affect is depressed,  but oriented, cooperative, and pleasant.  Follows commands without  difficulty, answers questions appropriately.   Cranial nerves are intact except coordination is intact.   EXTREMITIES:  Reflexes are 1+ in upper and lower extremities.  No  abnormal tone is noted.  No clonus is noted.  She has normal muscle bulk  in both upper and lower extremities.  Motor strength is in the 5/5 range  without focal deficits.  She is able to transition from sitting-to-  standing without difficulty.  Tandem gait.  Romberg tests are all  performed adequately.   She has significant limitations with  respect to cervical range of  motion, 30 degrees to the right, about 10 degrees with rotation to the  left, limited in forward flexion and extension as well.  She has  limitations in both shoulders with respect to range of motion including  abduction as well as internal and external rotation.  Lumbar motion is  also limited in all planes.   IMPRESSION:  1. Cervicalgia, status post C5-6 fusion with degenerative disk disease      at multiple levels.  2. Lumbar spondylosis with lumbago, known degenerative disk disease,      facet arthropathy, and neuropathic left leg pain.   PLAN:   Recommend continue to use a soft collar on a p.r.n. basis.  We  will refill her MS Contin 30 mg 1 p.o. b.i.d. and Flexeril 5 mg 1 p.o.  b.i.d.  She is planning to follow up with Dr. Channing Mutters this month, her  neurosurgeon.   Allison Lee does take her narcotic pain medications as prescribed.  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  We will see her back in a month to 5 weeks.           ______________________________  Brantley Stage, M.D.     DMK/MedQ  D:  01/16/2009 10:13:08  T:  01/16/2009 21:35:24  Job #:  [id number]   cc:   Clydie Braun L. Hal Hope, M.D.  Fax: [phone]   Lynden Ang, M.D.  Fax: [phone]

## 2011-03-17 NOTE — Assessment & Plan Note (Signed)
Allison Lee is a 55 year old married female who is being seen in our [facility] for multiple chronic pain complaints which include,  cervicalgia, bilateral upper extremity pain, low back pain, and  bilateral lower extremity intermittent numbness and tingling.   She is back in today and states that when she sits for a long time in  her recliner she does develop some numbness and tingling in the feet  which improves after she stands up for a little bit. She has also had  some increased neck pain over the last couple of days and she has been  having to help transfer her sick mother.   She states her average pain is about a 9 on a scale of 10. No problems  controlling bowel or bladder. She states that she did see Dr. Kinnie Scales for  a colonoscopy on August 5. One polyp was removed and she is currently  taking some Doculax up to 3 tablets in the evening for her chronic  constipation. Amitiza which was given to her at last visit did not  really help that much.   Main pain complaints are cervicalgia and lumbago today. Pain is  described as intermittent, sharp, burning, stabbing, aching in nature.  Pain is worse throughout most of the day intermittent especially with  activity. Sleep has been poor partly due to having to monitor her mother  at night.   Rest and medication, and cervical collar does seem to help the neck  pain, and she does wear a lumbar support too. She gets a little relief  with the meds that she has been prescribed.   She is able to walk about 5 minutes at a time. She is able to climb  stairs and she is able to drive. She needs some assistance with  dressing, bathing, occasionally toileting, but is independent for the  most part and is actually assisting her ailing mother for most of her  ADLs as well.   Had some intermittent limb swelling, constipation which is improved with  the Doculax.   Past medical social and family history remarkable for smoking half a  pack of cigarettes a day.   Medications prescribed by this clinic include:  1. MS Contin 30 mg 1 p.o. b.i.d.  2. Flexeril 5 mg 1 p.o. t.i.d. p.r.n.  3. Ultracet 1 tablet p.o. t.i.d.   PHYSICAL EXAMINATION:  Blood pressure is 117/77, pulse 74, respirations  18, 100% saturated on room air. She is a well-developed, well-nourished  female who appears somewhat tired but not in any distress. She is  oriented x3. Her speech is clear. Her affect is alert. She is  cooperative and pleasant. She follows commands without difficultly. She  states she is quite stressed about her mother's illness.   Transitioning from sit-to-stand without difficultly. Tandem gait,  Romberg's test are performed adequately. Limitations in cervical and  lumbar range are noted, as well as bilateral shoulders. Reflexes are 2+  at the patellar tendon bilaterally, 1+ at the Achilles tendon  bilaterally. Motor strength is good in the lower extremities. No focal  deficits noted in the upper extremities.   No abnormal tone is noted.   IMPRESSION:  1. Status post C5-6 fusion 2003 with chronic cervicalia.  2. Intermittent neuropathic left upper extremity pain, currently not a      significant problem at this time.  3. History of depression and anxiety.  4. History of lumbago.  5. Chronic constipation overall improved with 2 to 3  Doculax tablets a      night.   PLAN:  She does not need refills on her medications today. She will come  in in 25 days for a refill of her MS Contin as well as her Ultracet. She  has been stable on those medications. She takes them as prescribed, does  not display any aberrant behavior. She does state that she is returning  back to her old primary care doctor Dr. Hal Hope at __________ [facility] here in [place]. We will see her back at nursing visit in 25  days. I will see her back in 2 months.           ______________________________  Brantley Stage, M.D.     DMK/MedQ  D:   07/20/2007 10:24:49  T:  07/20/2007 13:30:15  Job #:  [id number]

## 2011-03-17 NOTE — Assessment & Plan Note (Signed)
Ms.  Allison Lee is a 55 year old married woman who is currently helping take  care of her sick mother and father.  She is back in today for refill of  her ED.  She is being seen in our pain and rehabilitative clinic for  chronic neck pain and low back pain.  She does have some intermittent  left upper extremity pain and intermittent left lower extremity pain as  well.   Her average pain is about an 8 on a scale of 10.   She recently had been helping her mother and had been using a lumbar  support; however, it is a rather flexible corset.  She brings it in.  There are only some stays in the lumbar area.  She is asking for  something that might be a little more supportive today for when she is  engaged in activities where she needs to help transport her mother or  her sister while she is walking.   Her average pain is about an 8-10 on a scale of 10.  It is located  mainly to the low back and posterior cervical region, radiating down  both lower extremities intermittently and both upper extremities  intermittent.  She describes her pain as intermittent, sharp, burning,  stabbing, tingling in nature depending on the day.   She gets a little relief using the current medications prescribed by  this clinic, which include MS Contin 30 mg twice daily, trazodone 50 mg  twice daily, Flexeril 5 mg t.i.d. and Ultracet 1 p.o. t.i.d.   She is able to walk about five minutes at a time.  She is independent  with her self care, for the most part.  She occasionally will need  assistance with dressing and bathing.  She is independent with feeding  and toileting.  She is able to drive.  She admits to depression and  anxiety.  Denies suicidal ideations.  She does have some complaints of  constipation.  She does use MiraLax and Dulcolax tablets; however, they  are not helping.  She tries to stay well hydrated and eat fiber as well.  None of these seem to be helping.  She does have contact in the past  with  Dr. Kinnie Scales, who apparently has followed her for constipation and  irritable bowel syndrome in the past.  She is also known, apparently, to  have some colon polyps.   No other changes in past medical, social, or family history since our  last visit.  She continues to smoke a half pack of cigarettes a day.   PHYSICAL EXAMINATION:  VITAL SIGNS:  Blood pressure is 110/62, pulse  115, respirations 16, 100% saturated on room air.  She is a well-developed female who appears her stated age.  She is  oriented x3.  Her affect is bright and alert.  She is cooperative and  pleasant.  Her speech is clear.  She follows commands without  difficulty.  Transitions from sitting to standing with ease in the room.  Her gait is not antalgic.  She displays good balance.  Tandem gait and  Romberg's test are performed adequately.  Reflexes are diminished in the  lower extremities, but no abnormal tone is noted.  Motor strength is the  5/5 range.  Upper extremities reflexes are 1+ at biceps, triceps,  brachial radialis.  Motor strength is in the 5/5 range without focal  deficits.  She has limitations in cervical as well as lumbar motion in  all planes.  IMPRESSION:  1. Status post C5-6 fusion in 2003 with chronic cervicalgia.  2. Intermittent neuropathic left upper extremity, left lower extremity      pain.  3. History of depression and anxiety.  4. History of lumbago.  5. Chronic constipation.   PLAN:  We will trial her on Amitiza 8 mcg 1 p.o. b.i.d. p.r.n.  constipation #20.  I do ask her to follow up and make an appointment  with Dr. Kinnie Scales for further evaluation.  Should she have any trouble  with this particular medication, she should discontinue it immediately.  We will refill her Ultracet 1 p.o. t.i.d. p.r.n. back or neck pain, #90.  No refills.  We will refill her MS Contin 30 mg 1 p.o. b.i.d. #60, no  refills.  We will write a prescription for a lumbar support.  We will  see her back in two  months.  Nursing visit next month for refill of her  medications.  She has not displayed any aberrant behavior.  She takes  her medications as prescribed.  Pill counts have been appropriate.  She  is able to maintain a fairly functional lifestyle, which does include  assisting her ill mother and father at this time.           ______________________________  Brantley Stage, M.D.     DMK/MedQ  D:  05/25/2007 13:22:41  T:  05/26/2007 02:31:40  Job #:  [id number]

## 2011-03-17 NOTE — Assessment & Plan Note (Signed)
HISTORY OF PRESENT ILLNESS:  Allison Lee is a 55 year old white married  female who is being seen in our pain and rehabilitative clinic for  chronic cervicalgia, low back pain, and intermittent left upper  extremity pain and left lower extremity pain.   She is back in today and reports some overall improvement in her neck  pain which she had initially described last month.  There is no history  of trauma.   She has been using a soft cervical collar which seems to have helped.  She does occasionally use a back brace as well.   Her average pain is about an 8 on a scale of 10.  The pain is localized  mainly to the left scapula region in the left posterior cervical region.  She does have some intermittent numbness and tingling down the left  upper extremity.  She describes some difficulty lifting the upper  extremity past 90 degrees.   Her sleep has been poor.  She gets a little relief with her current  medications.   MEDICATIONS:  Medications from this clinic include:  1. Flexeril up to three times a day, 5 mg.  2. MS-Contin 30 mg twice a day.  3. Ultracet, one by mouth three times a day p.r.n.   REVIEW OF SYSTEMS:  She is able to walk about five minutes at a time.  She is able to climb stairs.  She is driving.  She needs assistance with  dressing, bathing, and higher level hospital duties; independent with  feeding and toileting.   She admits to some depression and anxiety.  She denies suicidal  ideation.  She denies problems controlling bowel or bladder.   She admits to occasional constipation.   No changes in past medical, social, or family history since last visit.   PHYSICAL EXAMINATION:  VITAL SIGNS:  Blood pressure is 107/65, pulse  116, respirations 16, and 100% saturated on room air.  GENERAL:  She is a well-developed, well-nourished female who appears her  stated age.  She does not appear in any distress, although she does seem  a little bit tired today.  NEUROLOGIC:   She is oriented x 3.  Her affect is alert, cooperative, and  pleasant.  Her speech is clear.  She follows commands without  difficulty.  She transitions from sit to stand easily.  Her gait in the  room is non-antalgic.  She displays good balance.  Tandem, gait, and  Romberg test are performed adequately.  She is noted to have decreased  range of motion in her cervical spine with rotation to the left.  She  lacks about 20 degrees compared to the right.  The left shoulder is  limited to about 85 degrees of abduction.  Reflexes in the upper  extremities, however, are symmetric, 2+ at the biceps, triceps, and  brachial radialis, 2+ at the patellar tendon symmetrically, 0 at the  right ankle, and 1+ at the left ankle.  Pinprick testing throughout the  left upper extremity reveals diminished sensation, especially over the  left lateral deltoid region and also throughout the upper extremity but  more so she describes most deficit appears to be left deltoid region.  Her motor strength, however, in the upper extremities is good  throughout, although she is unable to give full effort with triceps  testing on the left because of increased pain in the left neck and  scapular region when she exerts.  No abnormal tone was noted in the  lower  extremities and no clonus was noted.   IMPRESSION:  1. Left scapular and shoulder pain as well as left-sided cervicalgia      which has lasted approximately five weeks now.  2. History of lumbago.  3. Status post C5-6 fusion, 2003.  4. 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  5. Depression.  6. Anxiety.   PLAN:  1. Will refill her MS-Contin 30 mg, one by mouth twice a day, #60, as      well as Ultracet, one by mouth three times a day, #90 p.r.n.  2. Will obtain cervical flexion/extension films today.  3. She will continue to use her soft collar on a p.r.n. basis.  4. May consider imaging studies in the next month or so if she       continues to have problems.  Will check flexion/extension cervical      radiographs first.  5. Will see her back in four weeks.  She uses her medications      appropriately.  She has not displayed any aberrant behavior and is      getting some relief with the medications to allow her to remain      functional and continue to help take care of her ailing mother.           ______________________________  Allison Lee, M.D.     DMK/MedQ  D:  03/30/2007 09:40:57  T:  03/30/2007 10:42:45  Job #:  [id number]

## 2011-03-17 NOTE — Assessment & Plan Note (Signed)
Allison Lee returns today.  She had transforaminal injections at L3-4 and  L4-5 on the left side.  She had no immediate postinjection  complications.  She states that following the injection, she developed  some nausea and headache.  As of April 02, 2008, no vomiting.  She did  have some vomiting on April 03, 2008, and April 04, 2008.  She had some  increased back pain noted and felt a bit weaker in her left leg.  Her  pain currently is greater than 9/10.  Her sleep is poor.  Her pain area  is the low back, left buttocks, and into the left leg.  The pain is  worse with walking, bending, sitting, and standing.  She can walk about  2 minutes at a time.  She needs some help with meal prep and household  duties.   REVIEW OF SYSTEMS:  Positive for numbness, tingling, weakness, trouble  walking, spasms, dizziness, anxiety, nausea, vomiting, and constipation.   SOCIAL HISTORY:  Married, lives with her husband.  Smokes half pack per  day of cigarettes.  Other physicians include Dr. Hal Hope, Tyler Aas  from Psychiatry, and Dr. Channing Mutters from Neurosurgery.   PHYSICAL EXAMINATION:  VITAL SIGNS:  Blood pressure is 106/71, pulse 68,  respirations 18, O2 sat 99% on room air.  GENERAL:  A well-developed, well-nourished female, in no acute distress.  Mood and affect alert, although appears tired.  Her orientation is x3.  Gait with limp favoring the left lower extremity, but no evidence of toe  drag or knee instability.   Her back has no signs of erythema.  No tenderness to palpation along the  injection sites.  She has no pain along the hip to palpation.  She has  good hip, knee, and ankle range of motion.  Negative straight leg raise  test.  Normal strength, bilateral lower extremities.  Normal deep tendon  reflex, bilateral lower extremities.   IMPRESSION:  1. Postinjection pain.  May have some lumbar soreness just from needle      sites.  No signs of infection.  We will treat with Flector patches      x3  days.  She is about 1 week post.  Reassured her that this should      get better in a few more days.  2. Nausea and vomiting on a couple occasions.  I do not think that      this is related to the injection and advised to follow up with      primary care.  She denies taking any over-the-counter nonsteroidal      antiinflammatory drugs.  Not taking any prescription nonsteroidal      antiinflammatory drugs, pain medicines, MS Contin, or Ultracet, but      she has been on the same dose for a long time.  May have some      relationship to her constipation with treatment with laxative in      that case.   She will follow up with Dr. Pamelia Hoit in a couple of weeks and follow  up with her primary care in regards to her nausea.      Erick Colace, M.D.  Electronically Signed     AEK/MedQ  D:  04/05/2008 10:53:34  T:  04/06/2008 02:47:10  Job #:  [id number]   cc:   Clydie Braun L. Hal Hope, M.D.  Fax: [phone]

## 2011-03-17 NOTE — Procedures (Signed)
NAME:  EVVA, DIN NO.:  [id number]   MEDICAL RECORD NO.:  [id number]           PATIENT TYPE:   LOCATION:                                 FACILITY:   PHYSICIAN:  Erick Colace, M.D.DATE OF BIRTH:  [birth year]   DATE OF PROCEDURE:  DATE OF DISCHARGE:                               OPERATIVE REPORT   PROCEDURE:  Left S1 transforaminal lumbar epidural steroid injection  under fluoroscopic guidance.   INDICATIONS:  Lumbar and left lower extremity pain down to the foot  which is only partially response to medication management including  narcotic analgesics.  Pain interferes with dressing, household duties  and shopping.  Rated as 8 out of 10.   Informed consent was obtained after describing the risks and benefits of  the procedure to the patient.  These include bleeding, bruising,  infection, temporary or permanent paralysis.  She elects to proceed and  has given written consent.  Patient placed prone on fluoroscopy table.  Betadine prep, sterile drape 25-gauge inch and half needle was used to  incise skin and subcu tissue 1% lidocaine x2 mL.  A 22-gauge 3 and 1/2  inch spinal needle was inserted under fluoroscopic guidance first  starting left S1 foramen.  AP, lateral and oblique imaging utilized.  Omnipaque 180 under live fluoro demonstrated no intravascular uptake  with good foraminal and nerve root spread followed by injection of 1 mL  of 40 mg/mL Depo-Medrol and 2 mL of 1% MPF lidocaine.  The patient  tolerated the procedure well.  Pre and post injection vitals stable.  Post injection instructions given to follow-up in 1 month for possible  reinjection.  Pre injection 9 and post injection 7.  Will monitor relief  during corticosteroid phase over the next few days.      Erick Colace, M.D.  Electronically Signed     AEK/MEDQ  D:  02/02/2008 10:45:03  T:  02/02/2008 11:40:54  Job:  [id number]   cc:   Brantley Stage, M.D.  Fax:  [phone]   Marcos Eke. Hal Hope, M.D.  Fax: [phone]   Saul Fordyce, NP   Payton Doughty, M.D.  Fax: [phone]
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
## 2011-03-17 NOTE — Assessment & Plan Note (Signed)
Allison Lee is a 55year-old married female who is being followed in out  [facility] for multiple pain complaints including  cervicalgia, left arm pain, low back pain and left leg pain.   She recently underwent lumbar MRI to evaluate lumbar spine and for left  leg pain.  The MRI ws reviewed with her today.  This was read by Dr.  Karin Golden showing multilevel disk degenerative changes with prominent  facets, narrowing of the subarticular lateral recesses at L5-S1,  possibly affecting either S1 root.   She states her average pain is about an 8 or a 9 on a scale of 10.  Pain  is described as constant, sharp, burning, stabbing, tingling in nature,  interfering significantly with activity levels and enjoyment of life.  Sleep tends to be poor.  Pain is worse with walking, bending, sitting,  standing.  Improves with rest, medication.  Getting very little relief  with the current medications prescribed.   She was trialed last month on Percocet small dose.  However, after  taking the mediation for several days, she continued to have significant  nausea and intolerance of the oxycodone.  She discontinued the  medication and is bringing in what is left of her bottle of pills today,  which is totaling 27 tablets.   FUNCTIONAL STATUS:  She is able to walk about 5 minutes at a time.  She  is able to walk without assistance.  Occasionally she will use a cane.  She is able to climb stairs.  She drives.  She is independent with feeding, bathing, toileting.  Needs some  assistance occasionally with dressing and with meal prep, household  duties and shopping.   REVIEW OF SYSTEMS:  Positive for intermittent numbness and tingling in  her upper and lower extremities.  She admits to depression, anxiety, but  currently denies suicidal ideation.  Review of systems otherwise noncontributory other than that already  stated.   PAST MEDICAL HISTORY:  Unchanged since last visit.   SOCIAL AND  FAMILY HISTORY:  Unchanged.  She continues to smoke a pack of  cigarettes a day.   MEDIATIONS PROVIDED BY OUR CLINIC:  1. MS Contin 30 mg 1 p.o. b.i.d.  2. Percocet 5/325 one-half tablet p.o. t.i.d.  Twenty-seven tablets were discarded today with witness documented in  chart.   EXAMINATION:  Blood pressure is 95/63, pulse 85, respirations 18.  99%  saturated on room air.  She is a well developed, well nourished female  who does not appear ion any distress.  She is oriented x3.  Overall her  affect is much brighter.  Her speech is clear.  She smiles  intermittently.  She follows commands without difficulty.   Transitioning from sitting to standing is done with ease today.  Her  gait is slow.  She has limitations in cervical range and lumbar range of  motion in all planes.  Tandem gait, Romberg's test are all performed  adequately.  Reflexes are 2+ at the patellar tendons and 1+ at the  Achilles tendons.  Negative straight leg raise.  Overall motor strength  is quite good without obvious focal deficit today.   IMPRESSION:  1. Left lower extremity pain status post MRI.  Results are attached to      the chart.  2. Lumbar degenerative disk disease and facet arthropathy.  3. Suggestion of S1 root irritation.  4. Status post C5-6 fusion with chronic cervicalgia.  5. Intermittent left upper extremity pain felt to  be neuropathic in      nature.  6. History of anxiety/depression.  Currently followed by psychiatrist.      Patient has now been taken off of her EMSAM.  She is no longer on      the MAO inhibitor.   PLAN:  We will refill the following medications for her today:  Ultracet 1 p.o. t.i.d. p.r.n. back, neck or leg pain #90 one refill.  MS Contin 30 mg 1 p.o. b.i.d. #60, no refills.  We will see her back in a month.           ______________________________  Brantley Stage, M.D.     DMK/MedQ  D:  01/04/2008 14:15:48  T:  01/04/2008 15:32:36  Job #:  [id number]

## 2011-03-17 NOTE — Procedures (Signed)
NAME:  Allison Lee, Allison Lee NO.:  [id number]   MEDICAL RECORD NO.:  [id number]          PATIENT TYPE:  REC   LOCATION:  [facility]                          FACILITY:  [facility]   PHYSICIAN:  Erick Colace, M.D.DATE OF BIRTH:  [birth year]   DATE OF PROCEDURE:  03/01/2008  DATE OF DISCHARGE:                               OPERATIVE REPORT   This is a left S1 transforaminal lumbar epidural steroid injection under  fluoroscopic guidance.   INDICATIONS:  Lumbar S1 radiculitis only partially relieved by  medication management including narcotic analgesic medications.  Pain  interferes with meal prep, household duties, and shopping, as well as  walking.   Informed consent was obtained after describing risks and benefits of the  procedure with the patient.  These include bleeding, bruising,  infection, loss of bowel and bladder function, as well as temporary or  permanent paralysis.  She elects to proceed and has given written  consent.   The patient placed prone on fluoroscopy table.  Betadine prep, sterile  drape.  A 25 gauge inch and a half needle was used to anesthetize the  skin and subcutaneous tissue; 1% lidocaine x2 mL and 22 gauge 3.5 inch  spinal needle was inserted in the left S1 foramen.  AP lateral and  oblique imaging utilized.  Omnipaque 180 __________  demonstrated no  intravascular uptake with good epidural and nerve root spread followed  by injection of 1 mL of 40 mg/mL Depo-Medrol and 2 mL of 1% MPF  lidocaine.  The patient tolerated procedure well.  Pre and post  injection vitals stable.  Return in 1 month.  She had a good result with  previous injection but had a fall and basically was back to square 1.      Erick Colace, M.D.  Electronically Signed     AEK/MEDQ  D:  03/01/2008 10:06:09  T:  03/01/2008 10:27:33  Job:  [id number]   cc:   Brantley Stage, M.D.  Fax: [phone]   Marcos Eke. Hal Hope, M.D.  Fax: [phone]   Tresa Endo, M.D. Ian Bushman

## 2011-03-17 NOTE — Procedures (Signed)
NAME:  Allison Lee, Allison Lee               ACCOUNT NO.:  [id number]   MEDICAL RECORD NO.:  [id number]          PATIENT TYPE:  REC   LOCATION:  [facility]                          FACILITY:  [facility]   PHYSICIAN:  Erick Colace, M.D.DATE OF BIRTH:  1956-10-17   DATE OF PROCEDURE:  03/29/2008  DATE OF DISCHARGE:                               OPERATIVE REPORT   PROCEDURE:  Left L3-4 transforaminal lumbar epidural steroid injection  and left L4-5 lumbar transforaminal epidural steroid injection under  fluoroscopic guidance.   INDICATIONS:  Lumbar radiculitis, only partially relieved by medication  management including narcotic analgesics and interfering with ADLs.   Informed consent was obtained after describing risks and benefits of the  procedure with the patient.  These include bleeding, bruising,  infection, temporary or permanent paralysis.  She elects to proceed and  has given written consent. The patient placed prone on the fluoroscopy  table.  Betadine prep, sterilely draped.  A 25-gauge 1-1/2-inch needle  was used to anesthetize the skin and subcutaneous tissue, 1% lidocaine  x2 mL at each of 2 sites, and 22-gauge 3-1/2-inch spinal needle was  inserted under fluoroscopic guidance.  First starting at left L3-4  intervertebral foramen, AP, lateral, and oblique image is utilized.  Omnipaque 180 demonstrated good epidural flow followed by injection of 1  mL of 10 mg/mL dexamethasone and 1.5 mL of 1% lidocaine.  This same  procedure was repeated at the left L4-5 intervertebral foramen.  The  patient tolerated the procedure well.      Erick Colace, M.D.  Electronically Signed     AEK/MEDQ  D:  04/05/2008 10:55:52  T:  04/06/2008 03:38:14  Job:  [id number]

## 2011-03-17 NOTE — Assessment & Plan Note (Signed)
[name] is a pleasant 55 year old woman, who has been followed  in our [facility] for multiple chronic pain  complaints which include predominantly cervicalgia, lumbago, left upper  extremity, neuropathic pain, and left lower extremity neuropathic pain.   Her average pain is about 7 on a scale of 10.  Pain does limit her  general activity and enjoyment of life.  She sleeps poorly.  Pain is  fairly constant, worse with activities in general, walking, bending,  sitting, standing, improves with rest.   MEDICATIONS:  She gets a little relief with current medications.   Medications provided from this clinic include MS Contin 30 mg 1 p.o.  b.i.d., Flexeril 5 mg 1 p.o. b.i.d. p.r.n.  She states she has been  taking much of this at this time over the last month, Ultracet 1 p.o.  b.i.d. to t.i.d. #90 per month.  She also has started taking a  nutritional supplement called Greens which contains multiple herbal  extracts.   I have told her that I am not sure of what kinds of interactions these  herbs may have with current medications that she is on.  She may want to  talk to her pharmacist about this.   FUNCTIONAL STATUS:  She does occasionally use a cane.  She is able to  walk 3-5 minutes at a time.  She is able to climb stairs.  She does  drive.  She is independent with self-care.   REVIEW OF SYSTEMS:  Positive for weakness, numbness, tingling, spasms,  depression, and anxiety.  Denies suicidal ideation.  Denies problems  controlling bowel or bladder.  No new problems have been noted in the  last month.   Review of systems including constitutional, gastrointestinal, urinary,  cardiorespiratory are all negative.   Physicians currently involved in her care are Dr. Hal Hope, Dr. Wynonia Lawman,  and Dr. Channing Mutters.   No changes in past medical, social, or family history, continues to live  with her husband and smokes half pack of cigarettes a day, caution  against this.   Exam today, blood pressure is 99/68, pulse 89, respirations 18, 100%  saturated on room air.  She is well-developed, well-nourished female,  who appears her stated age and does not appear in any distress.   She is oriented x3.  Speech is clear.  Affect is bright.  She is alert,  cooperative, and pleasant.  Follows commands without difficulty.  Answers questions appropriately.   Cranial nerves are intact.  Coordination is intact.  Reflexes are 2+ at  the upper extremities and symmetric to biceps, triceps, brachioradialis,  2+ patellar tendons, 1+ Achilles tendons.  No abnormal tone is noted.  No clonus is noted.  No tremors are appreciated.   Sensation, no new sensory deficits are noted on exam.  She has 5/5  muscle strength without focal deficits on manual muscle testing today.   Multiple areas of tenderness throughout the cervical paraspinal muscles  and parascapular muscles which is not new and some tenderness is noted  in the lumbar paraspinal musculature as well.   IMPRESSION:  1. Cervical spine degenerative disk disease and facet arthrosis.  2. Status post C5-6 fusion, Dr. Channing Mutters.  3. History of lumbar degenerative disk disease, facet arthropathy,      some chronic left neuropathic leg pain.   PLAN:  We will refill the following medications for her, MS Contin 30 mg  n.p.o. b.i.d. #60 no refills and Ultracet 1 p.o. b.i.d. to t.i.d. p.r.n.  #  90.  She does not need a refill on her Flexeril today.  She states she  has overall been doing better over the last month.  We will have her  stop back in 34-month for refill of her MS Contin, and I will see her  back in 2 months.   Ms. Medine has been taking medications as prescribed.  She uses her  narcotic pain medication responsibly.  No aberrant behavior has been  observed.   She is able to maintain a relatively functional lifestyle despite  significant orthopedic and pain complaints.           ______________________________  Brantley Stage, M.D.     DMK/MedQ  D:  03/22/2009 10:51:06  T:  03/22/2009 23:58:45  Job #:  [id number]

## 2011-03-17 NOTE — Assessment & Plan Note (Signed)
Allison Lee is a 55 year old married woman, who has followed in our  pain and rehabilitative clinic for multiple chronic pain complaints  which include cervicalgia, lumbago, left upper extremity pain,  neuropathic pain, left lower extremity neuropathic pain.   She was seen last on Mar 22, 2009.  She states today that the day after  I saw her in May, she had a fall and twisted her ankle disrupting tendon  and ligament.  She was initially treated conservatively by Dr. Hal Hope  with an orthopedic boot and was referred to Dr. Chaney Malling for left ankle  surgery.  She underwent surgery on May 14, 2009.  She last saw Dr.  Chaney Malling 3 weeks ago and has another appointment within 3 days.   She has been using crutches and an orthopedic boot for the last several  weeks.  She continues to have left lower extremity pain and is  complaining of some swelling around the ankle after the surgery.  She  will be following up with Dr. Chaney Malling for this.   Her average pain is a 9 on a scale of 10.  Pain is rather constant.  Poor sleep is noted.  Pain is worse when she is up walking and standing,  aggravated by sitting as well, improves with rest and medication, gets a  little relief with current meds.   FUNCTIONAL STATUS:  She can walk about a minute or so with her crutches.  She is able to climb stairs.  She is able to drive.  She is independent  with feeding and toileting, needs assistance with dressing and bathing,  some meal prep, shopping and household duties.   Denies problems with controlling bowel or bladder.  Admits to knee  weakness, numbness, tingling, trouble walking, spasms, depression,  anxiety.  Also notes, she has some intermittent problems with  constipation which is controlled with MiraLax and Dulcolax.  Occasional  limb swelling especially in the left lower extremity right now.   No other changes in past medical, social, family history since last  visit other than that noted  already.   Medications prescribed through our clinic include:  1. MS Contin 30 mg twice a day.  2. Flexeril 5 mg not more than twice a day.   Ultracet 1 up to 3 times a day, although after surgery she has used for  a day on occasion.   PHYSICAL EXAMINATION:  VITAL SIGNS:  Blood pressure is 105/61, pulse 98,  respirations 18, and 98% saturated on room air.  GENERAL:  She is well-developed, well-nourished woman, who appears her  stated age, in no apparent distress.  NEUROLOGIC:  She is oriented x3.  Speech is clear.  Affect is bright.  She is alert, cooperative, and pleasant.  Follows commands without  difficulty.  Answers my questions appropriately.  Cranial nerves and  coordination are intact.  MUSCULOSKELETAL:  Reflexes are 2+ at the patellar and Achilles tendon on  the right, 2+ at the left patellar tendon.  Left Achilles tendon is not  tested.  She is currently in a boot postoperatively, this was not  removed.  She will be following up with her orthopedist Dr. Chaney Malling in  2 more days.  Motor strength is 5/5 at hip flexors bilaterally, knee  extensors are 5/5 on the right as well as on the left.  Knee flexors are  5/5.  Dorsi and plantar flexors on the right are intact and on the left  they are not tested since she is in  the boot.  No new sensory deficits  are appreciated.   She transitions from sitting to standing, uses crutches for ambulation  in the room today.  Flexion and extension of her lumbar spine are  limited secondary to pain in all directions.   Tenderness in the lumbar paraspinal muscles are noted.   IMPRESSION:  1. History of lumbar degenerative disk disease, facet arthropathy,      chronic left neuropathic leg pain.  2. Cervical spine degenerative disk disease, facet arthrosis.  3. Status post C5-6 fusion, Dr. Channing Mutters.  4. New left ankle surgery Dr. Chaney Malling on May 14, 2009, to follow up      with Dr. Chaney Malling on June 24, 2009.   PLAN:  We will refill her  medications as follows:  1. MS Contin 30 mg 1 p.o. b.i.d., #60.  2. Flexeril 5 mg not more than twice a day, #60.  3. Ultracet 1 p.o. increasing slightly from 3 times a day to 4 times a      day, #120.   We will obtain lumbar MRI to further evaluate lumbar spine for  radiculopathy.  We will see her back in a month.           ______________________________  Brantley Stage, M.D.     DMK/MedQ  D:  06/21/2009 12:43:48  T:  06/22/2009 03:28:36  Job #:  [id number]

## 2011-03-17 NOTE — Assessment & Plan Note (Signed)
Allison Lee is a 55 year old married female, who is being seen in our  Pain and Rehab Clinic for chronic cervicalgia, low back pain,  intermittent left upper extremity pain, and left lower extremity pain.   She is back in today for a refill of her medications.   She reports her pain is about a 9 on a scale of 10, overall intermittent  in its nature, worse with activity, improved with rest, described as  sharp, burning, stabbing, tingling down her bilateral upper extremities  and left lower extremity.   Sleep tends to be poor; however, she is up part of the night with her  mother, who has been quite sick recently.   Her pain improves with rest and medication.  She gets a little bit of  relief with the current meds that she is on.   She can walk about 5 minutes at a time, she is able to climb stairs, she  is able to drive.  Admits to depression and anxiety, denies suicidal  ideation.   Admits to some constipation intermittently.   Medications provided by this clinic include:  1. Flexeril up to 3 times a day and she has two refills left.  2. MS-Contin 30 mg twice a day.  3. Ultracet one p.o. t.i.d. on a p.r.n. basis.   There have been no new changes in her past medical, social, or family  history since our last visit.  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   PHYSICAL EXAMINATION:  VITAL SIGNS:  Her blood pressure is 134/65, pulse  85, respirations 18, 99% saturated on room air.  GENERAL:  She is a well-developed, well-nourished female, who appears  her stated age.  She does not appear in any distress, although she does  appear somewhat tired.  She is oriented x3.  Speech is clear.  Affect is  alert and pleasant.   She follows commands without difficulty, transitions from sitting to  standing independently, gait in the room is normal, tandem gait is  performed adequately, Romberg's test is performed adequately.  She has  limitation to cervical range  of motion in all planes, she has limitation  in the shoulder range of motion to 90 degrees.   Reflexes are evaluated and 2+ at the biceps, triceps, and  brachioradialis bilaterally, 2+ at the patellotendons, and 1+ at the  Achilles tendons.  She has decreased sensation intermittently down the  left arm and is intact to light touch today.  Motor strength in the  upper extremities is in the 5/5 range without focal deficits  appreciated.  Lower extremity manual muscle testing does not reveal any  focal deficits as above.   IMPRESSION:  1. Status post C5-6 fusion, 2003, with chronic cervicalgia.  2. An intermittent neuropathic left upper extremity pain and left      lower extremity pain.  3. History of depression and anxiety.  4. History of lumbago.   PLAN:  We refilled the following medications for her today:  1. Ultracet one p.o. t.i.d. p.r.n. neck pain, #90.  2. MS-Contin 30 mg one p.o. b.i.d., #60.  3. She does not need a refill on her Flexeril.  4. We discussed possibly ordering a cervical MRI.  She would like to      hold off on this.  5. She will continue to use her soft collar on a p.r.n. basis.  6. We will see her back in one month.  ______________________________  Brantley Stage, M.D.     DMK/MedQ  D:  04/27/2007 14:44:23  T:  04/27/2007 22:53:23  Job #:  [id number]

## 2011-03-17 NOTE — Assessment & Plan Note (Signed)
[name] is a 55 year old married female who is being followed  in our pain and rehabilitative clinic for multiple chronic pains.  Complaints which include cervicalgia, bilateral upper extremity pain,  low back pain, and bilateral lower extremity intermittent numbness and  tingling.   She is back in today for refill of her medications.  She has seen her  family doctor, Dr. Hal Hope, this month.  She was started on some Chantix  as well as Cymbalta 60 mg twice a day for anxiety and depression.   Allison Lee has experienced some increased stress recently.  Her father  had been diagnosed with lung cancer and is possibly undergoing some  surgery.  Allison Lee continues to care for her sick mother as well, who  also has cancer.  Allison Lee feels quite stressed out and saddened by  these events.   She states her average pain is about an 8-9 on a scale of 10, localized  to the parascapular region, mostly in the left scapular area and low  back.  She has some numbness and tingling down the left arm as well as  the left leg, which is not new.  She reports some difficulty sleeping,  partly because she has concerns about her mother waking in the night and  being unsafe.  She reports the pain is typically worse with activity,  improves with rest and medication.  She gets a little relief with her  medications currently.   Medications prescribed by our clinic include MS-Contin 30 mg twice a  day, Flexeril 5 mg three times a day, Ultrastat 1 p.o., t.i.d., p.r.n.   She is able to walk about five minutes at a time.  She is able to climb  stairs.  She can drive.  She occasionally needs assistance with dressing  and bathing.  She needs assistance with high level hospital duties and  shopping.  She is independent with feeding and toileting.  She has  obtained an aide, who does help with her mother seven hours a day now.   There are no new changes regarding her review of systems.  She continues  to have some intermittent problems with numbness, tingling and weakness  in the left upper extremity, left lower extremity.  Admits to some  depression and anxiety.  No suicidal ideation at this time.  She has had  no progression of any kind of weakness or numbness or tingling.   She continues to be followed by Dr. Hal Hope as well as Tresa Endo __________  .   Past medical, social, family history otherwise noncontributory.  On exam her blood pressure is 130/71, pulse 87, respirations 18.  Saturation 99% on room air.  She is a well developed, well nourished  female who appears her stated age.  She is somewhat emotional today.  She does cry when she discusses her father's new diagnosis of cancer.  She seems somewhat distraught over this as well as tired.  She admits to  depression but denies suicidal ideation.   Her affect is alert.  She is cooperative, pleasant and she follows  commands without any difficulty.   Transitioning from sit to stand is made with ease.  Her gait in the room  is stable and symmetric.  No antalgia is appreciated.  She has  limitations in cervical as well as lumbar range of motion.  Her reflexes  are 2+ in the patellar tendons, 1+ at Achilles tendons bilaterally.  Upper cervical reflexes are 1+ and intact throughout.  Motor strength is  good in the lower extremities.  No focal deficits appreciated.  No  abnormal tone is noted.   IMPRESSION:  1. Status post C5-C6 fusion with chronic cervicalgia.  2. Intermittent neuropathic left upper extremity pain.  3. History of depression and anxiety.  4. Lumbago.  5. Chronic constipation, currently treated with 2-3 Dulcolax tablets      on p.r.n. basis.   We will refill the following medications for her today:  1. Ultracet 1 p.o., t.i.d., p.r.n., neck or back pain, #90 with no      refills.  2. MS-Contin 30 mg 1 p.o., b.i.d., #60 with no refills.   She does not need any refills on Flexeril at this point.  I encouraged   her to maintain contact with her primary care doctor, as well as her  psychologist.  Will see her back in a month.  Allison Lee has not  displayed any aberrant behavior.  She takes her medications as directed  and her pill counts are appropriate.           ______________________________  Brantley Stage, M.D.     DMK/MedQ  D:  09/07/2007 14:17:51  T:  09/08/2007 07:31:23  Job #:  [id number]

## 2011-03-17 NOTE — Assessment & Plan Note (Signed)
Allison Lee is a 55 year old married woman who is being followed  in our pain and rehabilitative clinic for multiple pain complaints  including cervicalgia, lumbago.  She has a 6 to 72-month history of left  leg pain and low back pain, however, in the last 6-8 weeks she has been  having falls.  Initially the pain in the leg is what made the leg give  way and she is still noting that she does not always have pain yet she  feels as though the leg is giving way on her.  She has been using a cane  now for about 4 weeks.  It does not feel like things are getting any  better for her.   Her average pain is about an 8 on a scale of 10.  Sleep tends to be  poor.  The pain is worse with sitting.  Pain in the low back, through  the buttock, down the posterior left thigh and some tingling into the  foot occurs predominantly when she sits for any period of time.  She is  getting some relief with the current meds provided by our clinic.   She states she has had some medication changes.  Her psychiatrist  recently started her on Emsam which is an MAOI which is contra-indicated  with Ultracet.  She states she has been on the combination of the two  over the last month.  Apparently, the psychiatrist is not aware she was  on Ultracet.   FUNCTIONAL STATUS:  She is able to walk about 5 minutes at a time.  She  is able to climb stairs and drive.  She is independent with feeding,  dressing, toileting, and bathing.  She needs some assistance washing her  hair and if she has to reach around to her back she needs a little bit  of assistance with dressing.  She is variably independent with meal  prep, household duties, and shopping.  She denies problems controlling  bowel or bladder.  She admits to depression anxiety but denies suicidal  ideation.  She does have some intermittent problems with constipation.   PAST MEDICAL HISTORY:  Remarkable for a recent change in some  psychiatric meds.  She is now not  only taking Emsam, she has also been  placed on Abilify as well as Klonopin.   She continues to smoke a pack of cigarettes a day, lives with her  husband.   PHYSICAL EXAMINATION:  VITAL SIGNS:  Blood pressure is 123/86, pulse 69,  respirations 18, 100% saturated on room air.  GENERAL:  She is a well-developed, well-nourished female who appears  slightly older than her stated age.  NEUROLOGIC:  She is oriented x3.  Her speech is clear.  Her affect is  bright.  She is alert, cooperative, and pleasant.  She follows commands  easily.  MUSCULOSKELETAL:  Transitioning from sit to stand is done slowly.  Her  gait is stable.  Tandem gait, Romberg test are performed adequately.  Heel-toe walking is performed.  She has significant limitations in  lumbar motion in all planes.  Seated reflexes are 2 plus the Patella  tendons, 1 plus at the Achilles tendons.  Slightly positive straight leg  raise on the left.  Decreased EHL strength noted bilaterally.  Decreased  sensation in L5-S1 dermatomes bilaterally are noted.   IMPRESSION:  1. A 6 to 78-month history of left lower extremity pain with increasing      falls over the last 4-6  weeks, using a cane in the last 4 weeks.      We will obtain an MRI of the lumbar spine.  2. Status post C5-C6 fusion with chronic cervicalgia.  3. Intermittent left upper extremity pain, felt to be neuropathic in      nature.  4. History of anxiety depression currently followed by psychiatrist,      currently undergoing some medication adjustments and now on MAOI.   We will refill her medications today:  1. MS Contin 30 mg one p.o. b.i.d.  2. We will discontinue Ultracet.  3. We will start her on Percocet 2.5/325 up to three times a day on a      p.r.n. basis.  4. We will discontinue Flexeril as well.  5. I will see her back after her scan is completed.   Incidentally, she mentioned she was attacked by her dog two days ago and  has a bandage over her left wrist.   She has some swelling in the dorsum  of her left hand.  I have recommended she follow up with primary care or  urgent care for evaluation of this swollen hand after a dog bite.  She  states she will comply with this recommendation.   I will see her back in a month.           ______________________________  Brantley Stage, M.D.     DMK/MedQ  D:  12/05/2007 11:43:53  T:  12/05/2007 15:39:48  Job #:  [id number]
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
## 2011-03-17 NOTE — Assessment & Plan Note (Signed)
HISTORY:  Allison Lee is a 55 year old married female who has been  followed in our [facility] for multiple pain  complaints including cervicalgia with intermittent left upper extremity  pain and lumbago with left lower extremity pain.   She was last seen by me on January 04, 2008.  In the interim, she has  undergone 3 epidural steroid injections with Dr. Wynn Lee.  The last  epidural steroid injection was on Mar 29, 2008.   She reports continued low back and leg pain despite these 3 epidural  steroid injections.  Her pain is about a 9 on a scale of 10 in the back,  as well as the leg.  The pain is described as sharp, burning, stabbing,  tingling, dull, and rather constant, significantly interfering with  general activities.  Sleep tends to be poor.   Pain is worse with walking, bending, sitting, standing, and anything  that causes her to use her left arm.   Pain improves with rest and medications.  She gets a little relief with  current meds.   Medications prescribed by out clinic include:  1. MS Contin 30 mg 1 p.o. b.i.d., #60.  2. Ultracet 1 p.o. t.i.d. p.r.n. back pain, #90.   FUNCTIONAL STATUS:  She is able to walk about 2 minutes at a time.  Her  legs tend to go numb.  She has complaints of pain in the posterior hip  and groin when she is walking as well.  She is able to climb stairs and  is able to drive.  She is independent with self care, needs occasional  assistance with lower extremity dressing and reaching around her back.   She denies problems controlling bowel or bladder.  Denies suicidal  ideations.  Does admit to some depression and anxiety.  Does admit to  intermittent numbness and tingling and trouble walking.   Occasional nausea and vomiting are noted.  She has had some problems  with constipation intermittently, states that the left leg occasionally  gives way, and she has been using the cane for several weeks now.   Current physicians  involved in her care include Dr. Wynn Lee, Dr.  Channing Lee, and Dr.  Wynonia Lee.  She sees Dr. Maryelizabeth Lee who is her Counsellor  over there.   No changes in past medical, social, or family history other than  treatment for bronchitis.  A couple of months ago, she was on some  antibiotics and she has recovered.   PHYSICAL EXAMINATION:  VITAL SIGNS:  Blood pressure is 104/57, pulse 84,  respirations 18, and 98% saturated on room air.  GENERAL:  A well-developed well-nourished female who appears her stated  age.  She is oriented x3.  Speech is clear.  Her affect is alert.  She  seems tired.  She is cooperative and pleasant and follows commands  without difficulty.  NEUROLOGIC:  Cranial nerves are grossly intact.  Her motor strength is  5/5 in upper and lower extremities without focal deficit.  Reflexes are 2+ at the patellar tendons and 1+ at the Achilles tendons  bilaterally.  She has no abnormal tone.  No clonus was noted.  Sensory exam revealed diminished sensation partially throughout the left  leg.  Gait is evaluated.  She has a stable gait.  She has a shorter stride  length on the right compared to the left.  She did not exhibit any gait  instability during exam today.  Tandem gait and Romberg tests are  performed adequately.   Forward flexion in the lumbar spine is about 20 degrees and with  extension is about 5 degrees.  She has normal range of motion at both  hips, however, with internal and external rotation on the left, she  complains of groin pain and posterior hip pain.   IMPRESSION:  1. Lumbar degenerative disk disease and facet arthropathy with left      leg pain.  2. Suggestion of S1 root irritation per MRI.  3. Status post T5-T6 fusion with chronic cervicalgia.  4. Intermittent left upper extremity pain.  5. New left groin pain, worse with ambulation, pain with internal and      external rotation in the groin with the range of motion of the left      hip, approximately  3 months duration now.  6. History of anxiety and depression, currently followed by a      psychiatrist.   PLAN:  We will refill following medications for her:  Ultracet 1 p.o.  t.i.d. p.r.n. back and neck pain, #90, one refill.  We will refill her  MS Contin 30 mg 1 p.o. b.i.d., #60.   We will obtain left hip radiograph.  She would like to follow up with  Dr. Channing Lee, as she has undergone 3 epidural steroid injections without  significant improvement in  her leg pain.  Pain does radiate down the  entire left lower extremity.  She has had a slight progression of  degenerative disease on MRI which was done in February 2009, compared to  the 2006 MRI.   We will see her back in a month.  We will review left hip x-ray with her  at that time.   Allison Lee has been stable on the above medications.  She takes the meds  prescribed.  She does not exhibit any aberrant behavior, and she is able  to maintain a relatively functional lifestyle despite multiple pain  problems and some impairments with respect to her spine.           ______________________________  Allison Lee, M.D.     DMK/MedQ  D:  04/27/2008 12:36:20  T:  04/28/2008 05:36:17  Job #:  [id number]

## 2011-03-17 NOTE — Assessment & Plan Note (Signed)
Allison Lee is a pleasant 55 year old married female who is followed in  our [facility] for multiple chronic pain complaints  including cervicalgia with intermittent left upper extremity pain and  lumbago with left lower extremity pain.   She was last seen by me on May 30, 2008.  In the interim, she has had  nursing visit.   She is back in today and reports that her mother died last month, and  she is still grieving over the loss of her.  She is getting some  counseling and maintaining contact with Dr. Wynonia Lawman, who also helps her  regarding her depression.   She has had a recent followup with Dr. Channing Mutters, who referred her to Dr.  Nickola Major.  Apparently, she underwent lumbar injection and reports  some minimal improvement in her left leg pain.   She states Dr. Channing Mutters does not feel surgery is a good option for her at  this time, and he is planning to follow her back up on June 08, 2008.   She is back in today requesting refill of her MS Contin.  Her average  pain is about a 7 on a scale of 10 in the low back and neck and legs.  It is interfering significantly with activity levels.  Sleeps tends to  be poor.  Pain is worse with activities; improves with rest,  medications, and injections.   Pain is described as sharp, burning, stabbing, tingling, and aching in  nature.  She gets a little relief with current medications.   MEDICATIONS:  Prescribed through this clinic include;  1. MS Contin 30 mg 1 p.o. b.i.d.  2. Ultracet 1 p.o. t.i.d. p.r.n., #90 per month.   FUNCTIONAL STATUS:  She is able to walk about 5 minutes at a time.  She  is able to climb stairs.  She is able to drive.  She is independent with  feeding and toileting.  She uses some assistance with taking care of her  hair and dressing.  Denies bladder control problems.  Admits to some  weakness, numbness, tingling, trouble walking, spasms, confusion,  depression, and anxiety.   Admits to some occasional  abdominal pain and poor appetite.   No other medical problems are noted by her since she was last seen at  the end of July.  She has been relatively healthy other than feeling  more depressed due to the loss of her mother.  She is currently not  suicidal, however, and states she would not do harm to herself.   No changes in social or family history since last visit other than  previously noted in history of present illness.   PHYSICAL EXAMINATION:  VITAL SIGNS:  Today, blood pressure is 136/82,  pulse 77, respirations 18, and 100% saturated on room air.  GENERAL:  She is a well-developed, well-nourished woman, who appears  alert and oriented.  She does appear somewhat depressed today.  She  follows commands without problems.  She is otherwise alert and  appropriate.   Cranial nerves are grossly intact.  Coordination is intact.  Reflexes  are 2+ at the patellar tendon, 0 at the Achilles tendon.  She has  significant limitations in cervical range of motion, shoulder range of  motion, and lumbar motion today.  Her motor strength is 5/5 at the hip  flexors, knee extensors, dorsiflexors, plantar flexors.  EHL on the  right is slightly half a grade weaker than on the left, 4/5 versus 4+ to  5-/5 on the left.   Straight leg raise is negative.  Gait is nonantalgic, but slow.  Tandem  gait and Romberg's test are all performed adequately.   IMPRESSION:  1. Lumbar degenerative disk disease and facet arthropathy with left      leg pain.  2. Suggestion of S1 nerve root irritation per MRI.  Appears to be      overall somewhat improved after recent lumbar injection.  3. Status post C5-C6 fusion with chronic cervicalgia.  4. Intermittent left upper extremity pain.   The patient does have a history of depression and anxiety.  She is  currently followed by a psychiatrist and has a Veterinary surgeon.   Plan will refill her MS Contin 1 p.o. b.i.d. 30 mg, #60, no refills  given.  I will see her back in a  month.  She is in stable on the above  medications.  No problems with oversedation or constipation.  She has  been taking her medications as prescribed.  No evidence of aberrant  behavior.  I will see her back in a month.           ______________________________  Brantley Stage, M.D.     DMK/MedQ  D:  07/25/2008 11:27:08  T:  07/26/2008 00:15:26  Job #:  [id number]

## 2011-03-17 NOTE — Assessment & Plan Note (Signed)
Allison Lee is a 55 year old married female who is followed in our [facility] for multiple chronic pain complaints including  cervicalgia with intermittent left upper extremity pain and lumbago with  left lower extremity pain.   She was last seen by me on April 27, 2008.  She is back in today  requesting a refill of her pain medications.  She states her biggest  problem today is her low back pain.  She describes it as about 8 on a  scale of 10.  Her cervicalgia is about 7 on a scale of 10.  She does  have some left upper extremity pain, which is about 7.  Her left leg is  now bothering her quite as much, it is about 5 on a scale of 10.  She  states she feels kind of numb occasionally, but this is not a  significant problem for her at this time.   Sleep tends to be poor.  Pain in general is described as constant,  sharp, burning, stabbing, and tingling in nature.  She reports getting a  little relief with current meds.  Pain is typically worse with  activities including walking, bending, even prolonged sitting, or  standing is bothersome for her.  Pain improves with rest and  medications.   She is able to walk about 2 minutes currently because of low back is  bothering her more than usual.  She is able to climb stairs.  She is  able to drive. She is independent with her self-care with the exception  of occasionally needing some help with dressing and bathing.  She does  not do any heavier household tasks anymore.   She has full control of bowel and bladder, but does have some occasional  constipation, which she uses Dulcolax for.   She admits to some depression and anxiety, but denies suicidal ideation.   REVIEW OF SYSTEMS:  Otherwise, noncontributory other than some mild limb  swelling she had with a new medication that Dr. Wynonia Lawman placed her on.  She was placed on doxepin and apparently, she had some limb swelling.  She has since been discontinued, the problem  and the swelling is  improved.   Past medical, social, and family histories are otherwise unchanged.  She  smokes about a half a pack of cigarettes a day.  She is quite stressed  because her mother does have a dementia and is requiring a good amount  of care at this time, and this has been stressful for Allison Lee.   MEDICATIONS:  Prescribed by this clinic include MS Contin 30 mg 1 p.o.  b.i.d. as well as Ultracet 1 tablet up to 3 times a day on a p.r.n.  basis.   PHYSICAL EXAMINATION:  Reveals blood pressure of 93/57, pulse 76,  respirations 20, and 99% saturated on room air.   She is a well-developed, well-nourished female who does not appear in  any distress.  She does seem a bit tired today, however.   She is oriented x3.  Speech is clear.  Her affect is somewhat depressed,  overall pleasant and she follows commands without difficulty.   She transitions from sitting to standing easily.  Her gait in the room  is normal.   She displays good coordination.  Reflexes are 2+ in the lower  extremities at the patellar and Achilles tendons.  Straight leg raise is  negative.  Motor strength is 5/5 without focal deficits.  She has  significant  limitations in motion in all planes with respect to her  lumbar spine today.  Extension particularly bothers her, and she has not  much more than 5 degrees of extension.   IMPRESSION:  1. Lumbar degenerative disk disease and facet arthropathy with left      leg pain.  2. Suggestion of S1 root irritation per MRI.  3. Status post C5-C6 fusion with chronic cervicalgia.  4. Intermittent left upper extremity pain.  5. Radiographs to evaluate the left hip last month were negative for      any arthropathy or any acute process, of note, with some bony      demineralization, which I discussed with her today.  6. History of depression and anxiety and is currently followed by      psychiatrist.   PLAN:  She does not need a refill on her Ultracet today.   She has one  refill left, and I will go ahead and refill her MS Contin 30 mg 1 p.o.  b.i.d., #60.   I discussed possibility of medial branch blocks with her moving toward  mid lumbar radiofrequency.  She would like to discuss her options with  her neurosurgeon, Dr. Channing Mutters, as well in the upcoming week or two.  I will  see her back next month.  I did review her left hip x-rays with her at  this time at this visit.   Allison Lee is stable on the above medication.  She takes her medications  as prescribed.  She does not exhibit any aberrant behavior with these  medications.  She is able to maintain a relatively functional lifestyle  despite multiple pain problems and impairments with respect to her  spine.   We will see her back in a month.           ______________________________  Brantley Stage, M.D.     DMK/MedQ  D:  05/30/2008 11:20:39  T:  05/31/2008 04:32:17  Job #:  [id number]

## 2011-03-17 NOTE — Assessment & Plan Note (Signed)
Allison Lee is a 55 year old married woman who is being followed in our  pain and rehabilitative clinic for multiple chronic pain complaints.   She has a history of a C5-6 fusion, intermittent neuropathic pain in the  left upper extremity, history of depression and anxiety, lumbago.   She is back in today.  She has been quite stressed.  She is caring for  an ailing mother and recently her father was also hospitalized and  underwent surgery at [facility] from which he is currently  recovering.   She was recently started on Chantix as well as Cymbalta within the last  2 months.   She states that, in that period of time, she has developed some swelling  in her lower extremities and has concerns regarding these new  medications.   Today, she is just requesting a refill of her medications.  She states  that, intermittently, once a month over the last couple of months, her  left leg has given way on her.  She has had some left lower extremity  pain intermittently as well.   Her predominant complaint, however, is low back pain, which limits her  activity.   Average pain is about an 8/10, predominantly located in the low back,  worse with bending, sitting, standing.  Improved with rest and  medications.   Pain is fairly constant  Sleep tends to be poor.  Pain is described as  variable in nature, sometimes more intermittent, sometimes more constant  for periods of time.  Sharp, burning, stabbing, tingling, aching in  nature.   She is able to walk about 5 minutes at a time.  She is able to climb  stairs.  She is able to drive.   She is independent with self care with the exception of dressing and  bathing.  Needs some assistance with that.  She also needs assistance  with meal prep, household duties, and shopping.   Denies problems controlling bowel or bladder, and has intermittent  numbness, especially in the left upper and lower extremities.  Occasionally, sometimes, she  does have some trouble walking.  Spasms are  noted in the low back.  Admits to depression and anxiety, but currently  denies any problems with suicidal ideation.   No other changes in past social, medical, and family history other than  2 new medications added by Dr. Hal Hope in the last couple of months,  which are Cymbalta, as well as Chantix.   MEDICATIONS PRESCRIBED BY THIS CLINIC:  1. MS-Contin 30 mg 1 p.o. b.i.d.  2. Ultracet 1 p.o. t.i.d.  3. She also uses Flexeril 5 mg once or twice a day on a p.r.n. basis.   EXAM:  Her blood pressure is 119/84, pulse 76, respirations 18, 99%  saturation on room air.  She is well-developed, well-nourished female who appears somewhat tired.  She is oriented x3.  Her speech is clear.  Her affect is overall bright.  She is cooperative and pleasant.  She follows commands without any  difficulties.   Transitioning from sitting to standing is done with ease.  Her gait in  the room is not antalgic, and stable.  Tandem gait, Romberg test are  performed adequately.  She has significant limitations in range of  motion in her cervical, as well as her lumbar spine in all planes.   Her reflexes are 2+ in the upper and lower extremities without any side  to side differences.  No abnormal tone is noted.  No clonus is noted.  Her motor strength is good in both lower extremities.  Hip flexors, knee  extensors, dorsiflexors, plantar flexors all in the 5/5 range.  She is  able to walk on heels and toes as well.   Palpation down the lumbar spine without point tenderness.   Also on exam it was noted she had some trace edema up to about the mid  tibial region, which I have asked her to follow back up with Dr. Hal Hope  to evaluate this.  She has been on MS-Contin as well as Ultracet and  Flexeril for many months now.  I doubt that these medications are  contributing to her edema.  She has been spending a good amount of time  at the hospital.  It is possible  that this is purely due to her lower  extremities being dependent for long periods of time.   IMPRESSION:  1. Status post C5-6 fusion with chronic cervicalgia.  2. Intermittent left upper extremity pain, felt to be neuropathic in      nature.  3. Lumbago.  4. History of depression and anxiety.  5. Intermittent constipation, which is treated with Dulcolax tablets      on a p.r.n. basis.   She has had some difficulty on uneven terrain.  She felt her left knee  give way in the last month when she was crossing railroad ties.  For  this reason, I am going to recommend for uneven terrain and long  distances, she have an assistive device, such as a cane.  This was  written for today as well.  Nursing staff to see her back in a month to  refill her MS-Contin 30 mg 1 p.o. b.i.d. #60, and her Ultracet 1 p.o.  t.i.d. #90 p.r.n. back and neck pain.  She has been stable on these  medications.  She does not display any aberrant behavior, and she takes  her medications as prescribed.           ______________________________  Allison Lee, M.D.     DMK/MedQ  D:  10/05/2007 14:22:50  T:  10/05/2007 17:04:30  Job #:  [id number]

## 2011-03-17 NOTE — Assessment & Plan Note (Signed)
[name] is a pleasant 55 year old married woman who is  followed in our [facility].  She has multiple  chronic pain complaints including cervicalgia and lumbago and as well as  radiating left upper extremity pain and radiating left lower extremity  pain.   She states pain is especially back for her in the left upper  extremities, particularly in the left scapular regions worse with  activities, standing, walking, bending, prolonged sitting.  Pain is  described as tingling, aching, stabbing, burning, sharp in nature,  fairly constant averaging between an 8 and 9 on a scale of 10.  Sleep  tends to be poor.   She recently followed up on January 23, 2009, with Dr. Channing Mutters who suggested a  cervical spine injection.  This was set up for her, however, she stated  that she had to cancel because she had to take care of her father.   She continues to manage her pain using MS Contin 30 mg twice a day,  p.r.n. Flexeril, and Ultracet 2-3 times per day.   She reports a little relief with the use of these medications.   FUNCTIONAL STATUS:  She can walk about 35 minutes at a time.  She is  able to climb stairs.  She does drive.  She is independent with self-  care, needs some help with zippers in the back of her clothing.  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   She recently saw Dr. Patsi Sears for overactive bladder few months ago,  was placed on some medication for that.  She continues to maintain  contact with Dr. Hal Hope.   REVIEW OF SYSTEMS:  Positive for occasional constipation.  Admits to  some depression and anxiety, but denies suicidal ideation.   No changes in past medical, social, or family history since last visit.  She was last seen on January 16, 2009.   MEDICATIONS PROVIDED THROUGH THIS CLINIC:  1. MS Contin 30 mg 1 p.o. b.i.d.  2. Flexeril 5 mg 1 p.o. b.i.d.  3. Ultracet 1 p.o. b.i.d. to t.i.d.   PHYSICAL EXAMINATION:  Blood pressure  is 116/69, pulse 97, respirations  16, 99% saturated on room air.  She is a well-developed, well-nourished  female who does not appear in any distress.  She is oriented x3.  Speech  is clear.  Affect is bright.  She is alert, cooperative, and pleasant.  Follows commands without difficulty and answers questions appropriately.   Cranial nerves and coordination are intact.  Reflexes are 2+ in the  upper extremities at biceps, triceps, brachioradialis, 2+ at the  patellar tendon, 0 at the ankles bilaterally.   Motor strength is generally good in both upper extremities with the  exception of left triceps is 4/5 and intrinsics are 4/5.  Lower  extremity strength is in the 5/5 range with the exception of left  dorsiflexors which is 4/5.   Straight leg raise is negative.   Limitations are noted in cervical range of motion in all planes.  She  has full shoulder range of motion, but complains of pain especially in  the left shoulder, as she approaches about 100 degrees abduction.  Her  pain is localized to the left scapular region.  She does not have any  anterior shoulder pain with his activity.   Sensory exam reveals decreased sensation especially in the left upper  extremity multiple dermatomes in the left lower extremity lateral aspect  of the left  leg.   She is able to transition from sitting to standing without difficulty.  Gait is stable.  Tandem gait and Romberg test are performed adequately.  Her balance is good.   IMPRESSION:  1. Cervical spine degenerative disk disease and facet arthrosis.  2. Status post C5-C6 fusion, Dr. Channing Mutters.  3. History of lumbar degenerative disk disease, facet arthropathy,      chronic left neuropathic leg pain.   PLAN:  Pill counts were done today and are appropriate.  She continues  to use her MS Contin as directed.  Also adjuncts include some Flexeril  and Ultracet.  She is tolerating these medications and is reporting some  relief with their use.   She has not exhibited any aberrant behavior with  the use of her narcotic medications.  We will see her back in a month.  We will refill her MS Contin 30 mg 1 p.o. b.i.d. #60.  She does not need  refills on Ultracet or Flexeril today.   Discussion today regarding a neck injection, discussed the medial branch  block as well as epidural steroid injections.  She states that she is  not interested in spine injections at this time.  Should she get worse,  she may consider it.  We will see her back in a month.           ______________________________  Brantley Stage, M.D.     DMK/MedQ  D:  02/20/2009 11:08:58  T:  02/20/2009 22:39:43  Job #:  [id number]   cc:   Payton Doughty, M.D.  Fax: [phone]   Marcos Eke. Hal Hope, M.D.  Fax: [phone]   Sigmund I. Patsi Sears, M.D.  Fax: [phone]

## 2011-03-20 NOTE — Assessment & Plan Note (Signed)
HISTORY OF PRESENT ILLNESS:  This is a 55 year old married female who is  helping take care of an ailing mother at home.  She is back in today and  is requesting a refill on her pain medications.  She also states that  she fell asleep with her neck in an extended position yesterday and woke  up with rather intense right neck and shoulder pain.  She slept for  about an hour before she realized that she was somewhat hyper-extended.   She has not reported any trauma.  She does have some new right neck and  shoulder pain however which is bothering her somewhat.  This has lasted  about one day now.   Her average pain is about 8 or 9 on a scale of 10.  The pain is  described as intermittent, tingling, stabbing, sharp and burning in  nature.  Sleep tends to be poor.  Pain is worse if she uses her arms.  She gets a little relief with the current medications that she is on.  She is able to walk five minutes at a time.  She is able to climb  stairs.  She is able to drive.  She is independent with feeding and  toileting, needs some assistance with dressing and bathing and she is  caring for her mother and helps baths her mother as well.   She denies problems controlling bowel or bladder.  Admits to depression  and anxiety.  Denies suicidal ideations.  Admits to some constipation as  well.   PAST MEDICAL HISTORY:  Remarkable for recent removal of 14 of her teeth  and placement of dentures.  After the removal of her teeth she was on  Demerol for approximately a week.  During that time she did not take her  Morphine Sulfate.   No other changes in past medical, social or family history.  Continues  to smoke about a pack of cigarettes once a week.   MEDICATIONS:  Medications provided by this clinic include MS Contin 30  mg one p.o. b.i.d.; Trazodone 50 mg nightly; Flexeril 5 mg on a t.i.d.  basis and Ultracet two times to three times a day.   PHYSICAL EXAMINATION:  Blood pressure is 116/68,  pulse 91, respirations  16, 100% saturated on room air.  She is a well-developed, well-nourished  female who appears her stated age.   She does not appear in any distress but she does appear somewhat  depressed.  She is alert, cooperative and pleasant.  Her speech is  clear.  No pain behaviors are displayed.  She is oriented x3.  She  follows commands without difficulty.   She transitions from sitting to standing easily.  Gait in the room is  stable.  Romberg test and tandem gait are performed adequately.   Cervical range of motion is significantly diminished.  She has about 20  degrees of range of motion to the right and left.  This is not new for  her.  She has limitations in flexion and extension as well.  She  performs these maneuvers quite slowly.   Right shoulder is limited to about 90 degrees of active abduction.  Left  shoulder is above 90 degrees.  She complaints of pain in the posterior  scapular region with this movement.   Reflexes in the upper extremities and lower extremities are 2+ at the  biceps, triceps, brachioradialis and present.  Lower extremity reflexes  are present in patellar tendons, diminished in the  Achilles tendon.  No  abnormal tone is noted.  No clonus is noted.   Decreased sensation to pin prick over the left thumb, otherwise intact  in the upper extremities.   Internal and external rotation of the right shoulder does aggravate her  somewhat and is limited on the right versus left.   IMPRESSION:  1. Right scapular and shoulder pain as well as right sided cervicalgia      which has lasted about one day now.  2. History of lumbago.  3. Cervicalgia status post C5-6 fusion.  4. 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  5. Depression.  6. Anxiety.   PLAN:  Will refill the following medications for her today.  Ultracet 1  p.o. b.i.d. to t.i.d. #60, no refills.  MS Contin 30 mg one p.o. b.i.d.  #60, no refills.  Flexeril 5 mg  one p.o. t.i.d. p.r.n. neck spasm #90,  three refills.  I will see her back in five weeks.  She was also given a  prescription for a soft cervical collar which she can use over the next  couple of days to try to improve her cervicalgia.           ______________________________  Brantley Stage, M.D.     DMK/MedQ  D:  02/23/2007 10:25:42  T:  02/23/2007 11:01:17  Job #:  [id number]

## 2011-03-20 NOTE — Assessment & Plan Note (Signed)
MEDICAL RECORD NUMBER:  [id number].   Allison Lee is a 55 year old   Dictation ended at this point.      DMK/MedQ  D:  03/11/2005 19:16:07  T:  03/12/2005 08:30:55  Job #:  [id number]

## 2011-03-20 NOTE — Assessment & Plan Note (Signed)
MEDICAL RECORD NUMBER:  [id number].   Allison Lee is back in to our pain and rehabilitative clinic for a brief  recheck and change in her medications.   She has been followed in her pain clinic for chronic neck and low back pain.  She is status post C5-C6 fusion. Also has lumbar spondylosis, degenerative  disk changes, and some channel narrowing at 4-5 and L5-S1.   She has been recently hospitalized for edema and ascites, etiology not clear  apparently.   She continues to have pain in the areas as described above. Average pain is  about an 8 to 9 on a scale of 10. Sleep is poor. Gets little relief with  methadone at this time. She is currently taking methadone 5 mg 2 in the  morning, 1 at noon, and 1 in the afternoon as well as 1 in the evening for a  total 25 mg each day.   Function and mobility:  The patient is able to walk about 15 minutes at a  time. She is able to climb stairs. She currently is able to drive. She has  been disabled since October 2004. She will need assistance occasionally with  bathing, meal prep, hospital duties, and shopping.   Admits to some weakness, numbness, tingling, depression, anxiety. Denies  suicidal ideation at this time. Last week, she was hospitalized for suicidal  ideation, and she is currently under the care of Allison Lee.   [name] states she would not take any medications to overdose at this  time, and she is comfortable with her psychiatric care at this time. She  feels she would call if she needed to. She had a followup appointment with  Dr. Katrinka Blazing next week.   PAST MEDICAL HISTORY:  No changes other than as noted above.   Social and family history are unchanged since last visit.   PHYSICAL EXAMINATION:  VITAL SIGNS:  Blood pressure 103/60, pulse 95,  respirations 16, 100% saturated on room air.  GENERAL:  She is a well-developed, mildly obese, white female in no apparent  distress. She is oriented x3. Her affect today is bright and  alert. She is  cooperative. She makes eye contact today, and she is appropriate.   She is able stand without difficulty. She has limitations in cervical and  lumbar range of motion. Her reflexes are in the 1 to 2+ range in the upper  and lower extremities. There is no obvious side to side differences. Normal  tone is noted in the upper and lower extremities. She has good strength  throughout.   IMPRESSION:  1.  Cervicalgia.  2.  Status post C5-6 fusion.  3.  C4-5 end-plate spurring with slight effacement of the cecal sac.  4.  Upper extremity radiculopathy predominantly on the left side.  5.  Facet arthrosis.  6.  Mild canal narrowing at L4-5 and L5-S1.   PLAN:  We will switch her from methadone today to MS Contin 15 mg 1 p.o.  q.12h. #30. I will see her back in two days to assess how she is doing with  the change over from methadone to morphine. Will encourage her to maintain  contact with her psychiatric care givers. At this time, she is not all  suicidal. She is appropriate, and her spirits seem much improved since last  week. She is currently also being treated with Wellbutrin, Lexapro, and  Seroquel. These are being written for by my psychiatric colleagues.   We will see her  back then in two days.      DMK/MedQ  D:  03/11/2005 19:25:11  T:  03/12/2005 08:23:04  Job #:  [id number]
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
## 2011-03-20 NOTE — Assessment & Plan Note (Signed)
HISTORY OF PRESENT ILLNESS:  Mrs. Ottaway is back in today for recheck and  refill of her medications. She is being seen for chronic neck pain. She is  status post a C5-6 fusion and has cervical spondylosis as well as mild  lumbar stenosis. She has chronic neck and low back pain and left radicular  pain. She has been plagued with multiple financial issues over the last  several months. Has been switched over to methadone. She has also recently  had some problems with edema in her lower extremities recently. Was  hospitalized about 5 days ago. Was in the hospital for about 3 days at [facility]. Apparently multiple tests were done. The patient reports that  she is unclear why she has intermittent edema. Average pain is about a 9 on  a scale of 10. Sleep has been poor. Little relief with the medications she  is taking, a total of 25 mg of methadone a day. She states that she is quite  depressed. Specifically asking her if she is suicidal. She says yes and  she would consider using her medications as a way of suicide. Her husband is  present with her. She expresses concern about her current psychologic state.   PHYSICAL EXAMINATION:  VITAL SIGNS:  Blood pressure 117/68, pulse 91,  respiratory rate 18, 99% saturated on room air.   IMPRESSION:  1.  Current suicidal ideation.  2.  Cervicalgia.  3.  Lumbago.   PLAN:  Due to the patient's current state of mind, Dr. Loralie Champagne office was  called. They recommend that we notify [facility]  and have them admit her. I spoke with staff over at [facility], Elijah Birk, who is waiting for her to come to their facility. He  has my cell phone number to let me know if she does not show up. Mrs. Ericsson  is currently with her husband, who will be taking her over there himself. At  this point, we will hold off on prescribing any methadone for her due to her  psychologic state of mind. Will see her back  in 2 weeks. Will need to  discuss case further with Dr. Nolen Mu and possibly the staff over at  [facility] regarding pain management issues for her.      DMK/MedQ  D:  02/27/2005 11:28:55  T:  02/28/2005 16:09:11  Job #:  [id number]

## 2011-03-20 NOTE — Assessment & Plan Note (Signed)
MEDICAL RECORD NUMBER:  [id number]   Allison Lee is a 55 year old married white female who is being seen in our  pain and rehab clinic for chronic low back pain and neck pain.  She has  recently been following up with Dr. Channing Mutters, who felt she had nonoperative  pathology.  He recommended cervical traction and exercise program.  She is  back in today and has been fairly stressed from her current family situation  and is also requesting refill of medications.  She states the current MS  Contin is no longer providing her with significant relief, nor was the  Ultracet.  Her pain is typically located in the cervical region.  She has  bilateral upper extremity symptoms, right lower extremity pain, and low back  pain.  Her pain is described as a 10 on a scale of 10, sharp, burning,  stabbing, tingling, aching in nature.  Poor sleep.  Interfering  significantly with most activities.  She is walking about 5 minutes at a  time at this point.  She is able to climb stairs.  She is able to drive.  She is independent with feeding and toileting, requires some assistance with  other activities of daily living.   Admits to anxiety.  Denies depression at this time.  Denies suicidal  ideation at this time.   Health and history form is reviewed today.  Past medical/social/family  history otherwise unchanged.   EXAMINATION:  Blood pressure 115/71, pulse 92, respirations 16, 98%  saturated on room air.  She is a well-developed, well-nourished female who  appears her stated age.  She is oriented x3.  Her affect is somewhat  depressed.   She transitions from sit to stand without difficulty.  Her cervical range is  limited in all planes, lumbar range is limited.  Her balance is good.  Tandem gait and Romberg's test are negative.  Seated reflexes are symmetric  and intact.  No abnormal tone is noted.  Motor strength is adequate  throughout, no focal weakness appreciated.   IMPRESSION:  1. Cervicalgia status post  C5-6 fusion.  2. Lumbago.  3. Lumbar spondylosis.  4. Depression/anxiety.   PLAN:  The patient would like to discontinue Ultracet at this point.  She is  not sure it is doing anything.  Will increase her MS Contin to 30 mg one  p.o. b.i.d.  Will also add Flexeril 5 mg one p.o. b.i.d., #60.  Next month  will re-trial her on the Ultracet and decrease her MS Contin again.   She has been trialed on multiple medications over the last few years.  Neurontin did not really help.  Lyrica she was unable to tolerate.  Topamax  side effect is osteopenia; she already is being treated for osteoporosis.  She is unable to tolerate any nonsteroidal anti-inflammatory medications,  was told not to take them by GI doctors.  She has a history of gastric  ischemic colitis.  She finds the TENS unit aggravating.  Lidoderm does not  work for her at all.   She had been recommended cervical traction by Dr. Channing Mutters.  She states that she  has used traction before, found it to increase her pain.  I have encouraged  her to continue to walk at least once or twice a day.  She states she will  try to comply with this recommendation.  Will see her back in a month.          ______________________________  Brantley Stage, M.D.  DMK/MedQ  D:  06/16/2006 15:11:34  T:  06/16/2006 15:58:22  Job #:  [id number]

## 2011-03-20 NOTE — Assessment & Plan Note (Signed)
HISTORY:  Allison Lee is a 55 year old married white female, who is a patient  of Dr. Tresa Endo Virgil's.  She is being seen in our [facility] for chronic neck and low back pain.  She was last seen by me on  October 02, 2005, and had a nursing visit on November 09, 2005.  She comes in  with complaints of left arm numbness and tingling, left leg pain, chronic  neck and low back pain; however, today she states that she has some new left-  sided chest pain which radiates to her left shoulder, which has gone on for  a couple of weeks apparently.  She states she does not have the chest pain  currently, but it has come and gone.  Sometimes it appears to be associated  with some palpitations as well.  She believes she had an e   SOCIAL HISTORY:  The patient had an electrocardiogram about one week ago, on  November 17, 2005, prior to a D&C, and she believes it was normal.  I do not  have any notes regarding this particular procedure or any notes describing  this electrocardiogram.   Her main complaint of pain is in the cervical region and the low back,  described as about a 9/10, varying in its quality between sharp, burning,  stabbing, tingling and aching.  Sleep is poor.  She gets a little relief  with the current medications she is on.  The pain is typically exacerbated  by any use of her upper extremities.  The pain improves with rest and  medication.  She is able to walk about five minutes at a time.  She is  limited by her back pain.  She can climb stairs and she can drive.  She  requires assistance with bathing, meal preparation, household duties and  shopping.  She is independent with feeding, dressing, toileting and has been  disabled since July 2006.   Denies suicidal ideations.  Does admit to depression and anxiety.  Denies  bowel or bladder control problems.  Does admit to occasional constipation,  limb swelling and weight gain.   PAST MEDICAL HISTORY:  Changes since  the last visit include a D&C on November 17, 2005.   SOCIAL HISTORY:  The patient continues to smoke 1/2 pack of cigarettes a  day.  She lives with her husband.  No other changes in her social history.   FAMILY HISTORY:  No other changes.  She does have a history of her mother  who has had a stroke.  Her father has had a myocardial infarction in 2002.   PHYSICAL EXAMINATION:  VITAL SIGNS:  Blood pressure 97/59, pulse 98,  respirations 16, 97% saturation on room air.  GENERAL:  She is a mildly obese white female, who appears her stated age.  She does not appear in any distress; however, she does  appear somewhat  tired and depressed.  She is oriented x3.  Her affect is otherwise  appropriate.  She is able to stand without much difficulty after being  seated.  Her gait is essentially non-antalgic, but it does appear slow and  deliberate.  NEUROLOGIC:  She has limitations in cervical and lumbar range of motion in  all planes.  She has some limitations in shoulder range of motion, more so  on the right than on the left today.  She is able to abduct her shoulders to  approximately 120 degrees bilaterally.  She has just mild  limitations with  internal and external rotation of both shoulders.  She does not seem to  exacerbate her pain.  Her reflexes are symmetric and intact in the upper and  lower extremities.  No abnormal tone is noted.  No clonus is noted.  Motor  strength is good throughout.  No focal weakness is noted.  No sensory  deficits are noted with light touch today.   IMPRESSION:  1.  Lumbago.  2.  Cervicalgia, status post C5-6 cervical fusion, Dr. Stefani Dama in      2003.  3.  Multi-level degenerative lumbar changes.  4.  Depression, currently being followed by Dr. Ian Bushman.   PLAN:  1.  Will refill her morphine today 15 mg, one p.o. q.8h., #90.  She has been      tolerating this well without any problems with over-sedation or      significant constipation.  She has been  controlled with Dulcolax and      MOM.  She has not displayed any aberrant behavior or inappropriate use      of the narcotic.  2.  Will refill her ibuprofen, to take one p.o. q.a.m.  3.  Today will add Lyrica 50 mg, one p.o. q.h.s., #60 and see how she      tolerates that and if she can get a little relief with it and help her      with sleep at night and help her with neuropathic pain.  4.  She will remain in contact with Dr. Ian Bushman for her depression.  5.  She will also be following up with Dr. Payton Doughty for her cervical and      lumbar pain.  6.  May consider a medial branch block for cervical pain for her.  7.  Would also like her to follow up with her primary care physician, Dr.      Ian Bushman, today for intermittent chest pain that she has been experiencing      over the last few weeks.  Again, she is not having any pain in my office      currently; however, she has had some palpitations and left-sided chest      pain recently.           ______________________________  Brantley Stage, M.D.     DMK/MedQ  D:  12/04/2005 12:40:03  T:  12/04/2005 13:31:01  Job #:  [id number]   cc:   Saul Fordyce, M.D.

## 2011-03-20 NOTE — Assessment & Plan Note (Signed)
Wednesday, October 06, 2006.   Allison Lee is a 55 year old married white female who has been seen in  our pain and rehabilitative clinic for chronic cervicalgia and lumbago,  radiating pain to the left upper and left lower extremity.   She is back in today for a refill of her medications.   She states that her mother has been sick in the hospital, is apparently  not doing well.  Ms. Oka has spent several nights now at the hospital  sleeping in a chair.  She states she has been in more pain because of  not sleeping well and sleeping in the chair.   She states her average pain is about an 8 to a 10 on a scale of pain.  Pain, again, localized to the cervical region and intrascapular region,  especially toward the left side, going down the left arm, and low back  pain associated with some left leg pain.  She gets a little relief using  the current medications.  She states that using her arms typically  exacerbates her pain.  She does have somebody help her with washing her  hair.  She is otherwise independent with her self-care.  She is able to  do some light meal prep and some household duties if she paces herself.   Health and history form and review of systems positive for bladder  control problems, weakness, numbness, tingling, spasms, depression,  anxiety.  She denies suicidal ideation at this time.  She states that  she has started going back to church for support as well.   No other changes noted in the past medical, social or family history.  She has a follow-up appointment with her primary care doctor, Dr. Jonny Ruiz,  December 13.   Medications from this clinic include:  1. Ultracet one to two p.o. t.i.d.  2. MS-Contin 30 mg twice a day.  3. Trazodone 50 mg at h.s.  4. Flexeril 5 mg t.i.d.   PHYSICAL EXAMINATION:  VITAL SIGNS:  Her blood pressure is 129/76, pulse  97, respirations 16, 100% saturated on room air.  GENERAL:  She is a well-developed, well-nourished female who  appears her  stated age.  She is oriented x3.  Speech is clear.  Affect is somewhat  anxious and depressed and tired.  She has clear speech and follows  commands without difficulty.  MUSCULOSKELETAL/NEUROLOGIC:  She transitions from sit to stand fairly  easily.  Her gait in the room is slow.  She has limitations in cervical  as well as lumbar range of motion.  Her reflexes, motor strength and  sensory exam are unchanged.   IMPRESSION:  1. Lumbago.  2. Cervicalgia.  3. Neuropathic pain, left upper extremity.  4. Neuropathic pain, left lower extremity.  5. Depression.  6. Anxiety.  7. Lumbar spondylosis.   PLAN:  Will refill the following medications for her today:   1. Ultracet one p.o. b.i.d. to t.i.d., #60.  2. MS-Contin 30 mg one p.o. b.i.d., #60.  3. Flexeril 5 mg one p.o. t.i.d. p.r.n., #90.   Will have a nursing visit for her in the next 4 weeks.  I will see her  back after the holidays within the next 6-8 weeks.           ______________________________  Brantley Stage, M.D.     DMK/MedQ  D:  10/06/2006 14:35:04  T:  10/07/2006 06:20:52  Job #:  [id number]

## 2011-03-20 NOTE — Assessment & Plan Note (Signed)
Ms. Irby is a 55 year old married white female who has been seen in  our pain and rehabilitated clinic for chronic cervicalgia and lumbago.   Ms. Rockhold states she has been under quite a bit of stress lately.  She  has been caring for her elderly mother at her home.  She has had to help  her mother with quite a bit of self care.   Ms. Fulcher pain in her neck, arms and low back is about a 8 or 9 on a  scale of 10.  She tends to sleep poorly.  She feels she has been  stressed and is anxious.   Pain is typically bad in the morning but continue pretty much throughout  the rest of the day.  She describes her pain as constant, stabbing,  burning and sharp in its nature.  The pain is typically worse with  activities, anything that makes her use her arms.  Improves with rest  and medications.  She gets a little relief with the current MEDS that  she is on.   She is able to walk about 5 minutes at a time.  She is able to climb  stairs.  She is able to drive.  She is independent with her self care  with the exception she needs some help hair and dressing occasionally,  she also needs some help with __________  and shopping.   Admits to depression and anxiety.  Denies suicidal ideation.   No changes in her review of systems.   No changes in past medical history or family history.   Medications provided by this clinic will include:  1. Flexeril 5 mg p.o. t.i.d. p.r.n. #90.  2. MS Contin 30 mg 1 p.o. b.i.d.  #60.  3. Ultracet 1 p.o. b.i.d. to t.i.d. #60.  No refills on the MS Contin or Flexeril.   EXAMINATION:  Her blood pressure is 120/75, pulse 100, respirations 16,  99% saturated on room air.  She is a well-developed, well-nourished female who appears her stated  age.  She seems somewhat tired today and depressed.  Her speech is  clear, however she is orientated x3.  She follows commands without  difficulty.   She transitions from sit to stand independently.  Her gait is non-  antalgic in the room, but slow.  Her tandem gait and __________ tests  are within normal limits.   She has significant range of motions limitations in her cervical spine.  She tends to hold her neck rotated and out flexed slightly to the right.   Shoulder range of motion is elevated to about 90 degrees bilaterally as  well.  She has limitations in lumbar motion in all flanks.   Her reflexes in the upper extremities are symmetric and intact.  Biceps,  triceps, brachiale and symmetric intact with the patella tendon and  Achilles tendon.   She reports a little bit of numbness over the left thumb and index  finger with pin prick testing.  Motor strength, however is good in both  upper and lower extremities.   IMPRESSION:  1. Cervicalgia.  2. Lumbago.  3. Neuropathic left upper extremity pain.  4. Neuropathic left lower extremity pain.  5. Depression.  6. Anxiety.  7. Lumbar spondylosis.   PLAN:  We will refill Ms. Bolt's Ultracet, MS Contin and Flexeril  today.  We will not make any changes in there dosages.  We will also  write her a prescription to see a therapist for a flexible  lumbar  support which she can wear when she is helping her mother.   Ms. Horace states that she has not done well in the past with a TENS  unit.  She does not find Lidoderm to be particularly helpful.  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  She has had trouble with edema using Lyrica and Neurontin and  has not tolerated Topamax in the past either.   She is currently stable on the current MEDS that she is on.  She does  get occasional constipation which she controls with milk of magnesia as  well as Dulcolax.  She does not feel to sedated.  She has not displayed  any __________  behavior and is getting a little to fair relief with the  current MEDS that she is on and is able to maintain her function.  We  will see her back in a month.            ______________________________  Brantley Stage, M.D.     DMK/MedQ  D:  12/01/2006 10:42:35  T:  12/01/2006 11:39:48  Job #:  [id number]

## 2011-03-20 NOTE — Assessment & Plan Note (Signed)
Allison Lee is a 55 year old married white female who is being seen in our  [facility] for chronic cervicalgia and lumbago. She is  status post a C5/6 fusion. She has intermittent upper extremity  radiculopathy on the left side. She has narrowing at the L4/5 and L5-S1  interspace as well.   She is back in today and reports overall improvement in the leg edema she  experienced at the last visit. She has been trying to get her feet up at  least once or twice a day to improve the edema. She reports it has helped a  lot. She has a new issue. She experiences intermittent left great toe  numbness which is intermittent. It comes and goes, last for a few minutes  several times a day.   She reports her constipation is controlled with a couple of Dulcolax at  night and occasional Glycolax. She does not have problems with oversedation  with the morphine she is currently taking. Her average pain is about a 9 on  a scale of 10, localized to the neck and low back area. She reports a little  relief with the current medications. Her sleep lately has been poor. The  pain is described as sharp, burning, stabbing, and aching. She can walk  about 10 minutes at a time. She is able to climb stairs and she drives.  Denies any suicidal ideation. Does have depression and anxiety.   No new changes in past medical history, social or family history since last  visit.   Blood pressure is 109/70, pulse is 84, respirations 16. Saturating 100% on  room air. She is a well-developed, well-nourished woman who appears her  stated age. She is oriented x3. Affect is bright, alert, and cooperative.  Gait is normal. She has limited range of motion in her lumbar spine as well  as cervical spine. Her lower extremity testing reveals 5/5 strength at hip  flexors, knee extensors, dorsiflexor, plantar flexors. EHL is 4/5 on the  right, 5/5 on the left. With pinprick evaluation, she reports numbness in  the  left L5 dermatome. Straight leg raise is negative. Very trace edema is  noted in both lower extremities, much improved from the last visit.   IMPRESSION:  1.  Cervicalgia.  2.  Lumbago.  3.  Status post cervical vertebrae-5/6 fusion.  4.  Improvement overall in lower extremity edema.  5.  New left toe numbness.   PLAN:  She will keep a journal of her pain and numbness for the next visit.  We will go ahead and refill her morphine, MS Contin 15 mg 1 p.o. q.8h. in  approximately 14 days for her. She currently has 47 pills left and does not  need a refill at this time. She will come in for a nursing visit in two  weeks and get her refill at that point. She has currently been stable on the  medication. No aberrant behavior or inappropriateness with the use of her  medications noted.           ______________________________  Brantley Stage, M.D.     DMK/MedQ  D:  08/05/2005 15:06:02  T:  08/06/2005 00:25:47  Job #:  [id number]

## 2011-03-20 NOTE — Assessment & Plan Note (Signed)
MEDICAL RECORD NUMBER:  [id number]   [name] is a 55 year old married white female who is being seen  in our pain and rehabilitative clinic for chronic cervicalgia, lumbago. She  is status post a C5-6 fusion. She has had intermittent upper extremity  radiculopathy on the left side, somewhat on the right side. She has  narrowing at L4-5, L5-S1. Today, she comes in and reports that she had some  left knee pain. Yesterday she was climbing some stairs to her home and when  she got to the top of two stairs she fell onto the left knee onto the  platform and developed some knee pain after that. She apparently scraped it  and bruised it.   Her average pain is about an 8 or a 9 on a scale of 10 in the cervical  region as well as the low back. Pain is typically described as sharp,  burning, stabbing, and tingling. Her pain is worse with walking, bending,  sitting, and standing; improves with rest. She gets a little relief with her  current medications. Pain is typically worse in the morning, in the daytime,  and at night.   Functionally, she has overall been doing better over the last couple of  months. Overall, her affect has been much better. She has been helping take  care of her mother, who apparently has a hip fracture. She has been having  to help out there. She has been up walking about 15 minutes at a time. She  is able to climb stairs and she drives. She has been disabled now since  October 2004. She needs assistance with bathing, meal prep, household  duties, and shopping. She is independent with feeding, dressing, and  toileting.   She reports no suicidal ideation today, does admit to depression and  anxiety, however.   No new changes in past medical, social, or family history since last visit.   EXAMINATION:  Blood pressure 104/74, respirations 16, pulse is 93, and 99%  saturated on room air. She is alert, oriented, cooperative. She is a well-  developed, mildly-obese  white female. Affect is much brighter than in the  past. She is alert, cooperative, and pleasant. She is able to stand. She  appears a bit antalgic as she stands up. Her gait is stable in the room. She  has intact lower extremities reflexes, strength is good. She has got  tenderness bilaterally at both trochanters, some discomfort with palpation  in the lumbar paraspinal musculature as well.   She had complained of some edema in the lower extremities. I do not  appreciate that today. There is no pitting edema noted. However, I am seeing  her in the morning. Apparently, her edema gets worse toward mid afternoon.   IMPRESSION:  1.  Cervicalgia.  2.  Lumbago.  3.  Status post C5-6 fusion.  4.  Upper extremity radiculopathy, intermittent, right and left side.  5.  Bilateral trochanteric bursitis.  6.  Intermittent lower extremity edema.   PLAN:  Will have her get her feet up at least an hour a day in mid afternoon  above her heart, lying down. If this is not helping her, will consider some  elastic stockings. We will refill her MS Contin today 15 mg one p.o. daily  18, #90, no refills. Consider getting her in a physical therapy program to  address trochanteric bursitis, lower extremity flexibility program. Her left  knee was evaluated today. She does have an ecchymosis over  the patella and  some scrapes over the anterior tibial area from her recent fall. There is no  edema round the knee. No fluid is noted. She  has good range of motion but she is somewhat tender with palpation over the  bruised areas. We will see her back in a month.           ______________________________  Brantley Stage, M.D.     DMK/MedQ  D:  07/13/2005 13:40:25  T:  07/13/2005 16:42:42  Job #:  [id number]
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
## 2011-03-20 NOTE — Assessment & Plan Note (Signed)
INTERVAL HISTORY:  Allison Lee is a 55 year old married white female, who is  being seen in our [facility] Re[facility]itative [facility] for chronic low back pain,  cervicalgia, left upper extremity pain and left leg pain.   She was last seen by me on June 16, 2006.  She describes her pain as being  about a 9 on a scale of 10, varying in its quality and intensity.  Tingling,  stabbing, burning sharp pain are adjectives she describes her arm, back and  leg pain with.   She reports poor sleep overall.  She reports worse pain with using her arms;  bending, walking, sitting and standing improved with medication and rest.  She gets a little relief with the current medications she is on.   She is walking about 5 min at a time.  She is able to climb stairs.  She  occassionally will drive.  She is independent with her self care.  Needs  some assistance with higher level household activities, such as meal prep,  household duties, shopping.   REVIEW OF SYSTEMS:  Health and history form positive for numbness, tingling,  spasms, dizziness, anxiety.  Denies suicidal ideation.  Denies problems  controlling bowel or bladder.   PRIMARY CARE PHYSICIAN:  Continues to follow up with __________  for primary  care.   PAST MEDICAL SOCIAL FAMILY HISTORY:  No new changes.   PHYSICAL EXAMINATION:  VITAL SIGNS:  Blood pressure 124/79, pulse 94,  respirations 18, 98% saturation on room air.  GENERAL:  She is a well developed, well nourished female, who appears her  stated age.  She does not appear in any distress today.  She is oriented x3.  Her affect is much brighter today.  She laughs and jokes in the room  somewhat today, but is still upset about the death of her brother when she  discusses him.   PAIN ASSESSMENT/EXAMINATION:  She does not display any pain behaviors.  She  is able to transition from sit to stand without difficulty. She does appear  a bit stiff as she stands up.  Her gait is normal, somewhat  slow; however,  symmetric stride length, no antalgia is appreciated.  She has limitations in  cervical and shoulder range of motion.  Limitations in lumbar range of  motion.  __________ is also appreciated.   Reflexes are 1-2+ upper and lower extremities.  No abnormal tone is noted.  No clonus is noted.  Normal muscle bulk is appreciated.  She has good  strength in the upper and lower extremities.  She has tenderness in the  lumbar paraspinal musculature today.  Robert's test and tandem gait are  within normal limits as well.   IMPRESSION:  1. Cervicalgia, status post C5-6 fusion.  2. Lumbago.  3. 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  4. Depression/anxiety.  5. Lumbar spondylosis.   PLAN:  We will refill her MS Contin at 30 mg one p.o. b.i.d. (#60).  Nursing  staff discussed the use of Tylenol with her as well today.  Will also refill  her Flexaril 5 mg one p.o. b.i.d. p.r.n. (#60 with 2 refills).   Again, she has been trialed on multiple medications over the last 2 years;  Neurontin does not help.  Lyrica, she was unable to tolerate it.  Topramax  with a side effect of osteopenia, she requested not to be treated with  (since she is already being treated for osteoporosis).  She is unable to  tolerate any nonsteroidal anti-inflammatory medications, and has been told  not to take them by GI doctors.  She has a long history of gastric ischemic  colitis.   Please see the note June 16, 2006 for adjuvants that have been trialed  with this woman.   We will see her back in one month.  She has been stable on these  medications.  She does get some relief.  She is able to maintain her  function, and she has displayed no aberrant behavior.           ______________________________  Brantley Stage, M.D.     DMK/MedQ  D:  07/16/2006 10:26:07  T:  07/17/2006 09:16:07  Job #:  [id number]

## 2011-03-20 NOTE — Assessment & Plan Note (Signed)
Allison Lee is a 55 year old married female who is being seen in our pain and  rehabilitative clinic for multiple pain complaints including cervicalgia,  intermittent upper extremity pain, lumbago, and intermittent bilateral lower  extremity pain worse on the left than on the right.   She was last seen by me on July 16, 2006.   She is back in today for a refill of her medications.  She states she took a  fall on Wednesday.  She was out getting the mail and stepped over a railroad  tie and her left foot gave way and she went down to the ground.  She  believes she landed on possibly her knee.  She does not really remember the  fall very well.  She states she has had some increased low back pain which  seems to have improved since the initial incident.  She has no new problems  with weakness, numbness, tingling.   Her average pain is about 8 on a scale of 10.  The pain is described as  sharp, burning, stabbing, tingling, aching.  Sleep has been poor the last  few nights.  She gets a little relief from the current meds that she is on.   FUNCTIONAL STATUS:  She is able to walk about 5 minutes at a time.  She is  climbing stairs.  She is able to drive.  She needs some assistance with  dressing and bathing.  Is independent with toileting and feeding.   Denies suicidal ideation or depression, does admit to some anxiety.  She  continues to maintain contact with her primary care doctor, Tyler Aas.   No changes in past medical, social, or family history since last visit.   PHYSICAL EXAMINATION:  VITAL SIGNS:  Blood pressure 108/62, pulse 112,  respirations 16, 98% saturated on room air.  GENERAL:  Ms. Lampi is a well-developed, well-nourished female who appears  her stated age, who is oriented x3.  Her affect is alert.  She is  cooperative, pleasant, overall seems somewhat depressed.  She does not  appear in any distress, however.  MUSCULOSKELETAL:  She is able to transition from sit  to stand slowly.  Her  gait is slow in the room but there is no limp.  She has a symmetric gait,  weightbearing equally both lower extremities.  Forward flexion increases her  discomfort somewhat.  Extension bothers her as well as side bending both  directions.  She has limitations in lumbar motion in all planes basically.  Palpation over the thoracic and lumbar spinous processes does not increase  her pain.  She has some discomfort in the lumbar paraspinal musculature,  however.  Reflexes are 2+ at Patella tendons; 1+ at the Achilles tendons  bilaterally.  Straight leg raise is negative.  She reports some patchy  numbness over the left lower extremity.  Her motor strength, however, in  both lower extremities is in the 5/5 range.  No focal weakness is  appreciated.   IMPRESSION:  1. Lumbago, slightly worse after recent fall.  2. Cervicalgia, status post C5-C6 fusion.  3. Neuropathic right lower and left lower extremity pain.  4. Depression/anxiety.  5. Lumbar spondylosis.   PLAN:  1. Discussed getting lumbar x-rays today.  She would like to wait and see      if she continues to improve.  She will let us know if she does not and      we will go ahead and get these for her.  She would also like to      consider epidural injection to help with lower extremity pain.  She      would like to talk to her husband, possibly get scheduled next month.      Overall she feels like she is improving.  2. We will refill her MS Contin today one p.o. b.i.d. #60, as well as      Flexeril 5 mg one p.o. t.i.d. p.r.n. back pain      or neck spasm, #80, no refills.  She is currently stable on these      medications, has displayed on aberrant behavior.  She has been taking      them appropriately.  Reports no problems with over sedation or      constipation.           ______________________________  Brantley Stage, M.D.     DMK/MedQ  D:  08/13/2006 09:38:41  T:  08/14/2006 16:10:96  Job #:   [id number]

## 2011-03-20 NOTE — Assessment & Plan Note (Signed)
HISTORY OF PRESENT ILLNESS:  Allison Lee is a 55 year old married female who  is being seen in our [facility] for chronic cervicalgia,  intermittent left upper extremity pain, lumbago, intermittent left lower  extremity pain. She is back in today for a refill of her medications. She  states overall, she is doing fairly well on these medicines. They give her  some relief. However, sleep has been poor lately. She has been quite  stressed. She has been caring for her grandmother, who's health is declining  as well as a brother who had cancer and recently died just a few days ago.  Allison Lee is visibly upset today. She appears to be depressed and tearful.  She states her average pain in about an 8 on a scale of 10. The pain is  described as sharp, burning, stabbing, tingling, and aching. She is able to,  for the most part, take care of her activities of daily living. She will  need some assistance occasionally with dressing, toileting, meal prep,  household duties, and shopping. She can walk about 5 minutes at a time. She  states she has not been sleeping well. However, she has been staying at  health care facilities for her grandmother. She has also slept in her truck  recently and on couches, to be near her ill loved one. She denies thoughts  of suicide ideation at this point. She does admit to some depression and  anxiety. Denies any problems controlling bowel or bladder. For the most  part, her functional status is stable.   PAST MEDICAL HISTORY/SOCIAL H HISTORY/FAMILY HISTORY:  No new changes since  our last visit.   PHYSICAL EXAMINATION:  VITAL SIGNS:  Blood pressure 99/66, pulse 72,  respiratory rate 17, 99% saturated on room air.  GENERAL:  A well developed, well nourished female who appears her stated  age.  NEUROLOGIC:  Oriented x3. Affect is depressed and somewhat tired today and  intermittently tearful. She transitions from sit to stand without much  difficulty. She does, however, appear quite stiff especially with cervical  range of motion. She has limitations. She tends to stand with her neck  tilted slightly to the right. She has limitations in shoulder range to about  90 degrees. Her lumbar range is also fairly limited as well. Her balance is  good. Gait is essentially normal. Her reflexes, tone, strength, and sensory  examination is unchanged and within normal limits.   IMPRESSION:  1.  Lumbago.  2.  Cervicalgia status post C5-6 fusion.  3.  Lumbar spondylosis.  4.  Reactive depression/anxiety.   PLAN:  The patient has been stable on morphine as well as Ultracet. Will  continue these medications for her. She does get some relief with them  without causing adverse or untoward effects. Morphine will be refilled 15 mg  slow release 1 p.o. q.8 hours #12, Ultracet 37.5/325 1 p.o. t.i.d. #180.  Will have nursing see her back in 1 month and I will follow her back in 2  months. At this time, she does not have any suicidal ideations. She has been  stable on these medications.           ______________________________  Brantley Stage, M.D.     DMK/MedQ  D:  03/26/2006 14:27:29  T:  03/27/2006 15:14:15  Job #:  [id number]

## 2011-03-20 NOTE — Assessment & Plan Note (Signed)
MEDICAL RECORD NUMBER:  [id number].   Allison Lee is a 55 year old married woman who is being seen in our pain and  rehabilitative clinic for chronic cervicalgia, left radiating pain, low back  pain, and left lower extremity sciatic type pain.   Her average pain is about a 9 on a scale of 10, completely interferes with  most of her activities. Pain is described as being fairly constant,  tingling, aching, stabbing, burning, and sharp in nature. Pain is worse with  walking, bending, sitting. Any use of her arm pain improves with rest and  medications. She gets little relief with her medications at this time.   She can walk about 10 minutes at a time. She is able to climb stairs, and  she is able to drive.   Denies any suicidal ideation today. Does admit to bowel control problems,  numbness, tingling, spasms, dizziness, trouble walking, depression, anxiety.  Also admits to weight gain, night sweats, constipation, abdominal pain, limb  swelling. Would like her to follow up with her family doctor for these  symptoms.   Past medical, social, family history unchanged since last visit. She  continues to maintain contact with her psychiatric health care workers  including Dr. Allyne Gee. She would like to again followup with [name]  at some point.   PHYSICAL EXAMINATION:  Blood pressure 100/72, pulse 80, respirations 16, 98%  saturated on room air. She is a mildly obese white female in no apparent  distress, oriented x3. Affect:  Bright, alert, and cooperative, pleasant  although somewhat depressed in her affect. She is able to stand. Gait in the  room is stable. She has limitations in her lumbar range in all planes,  limitations in cervical range in all planes. She has actually good shoulder  range of motion bilaterally. Seated reflexes are 1 to 2+ upper and lower  extremities. No significant side to side differences. No abnormal tone is  noted. No clonus is noted. She has good strength  in upper and lower  extremities with the exception of left intrinsics, question effort here.   Decreased sensation noted, left upper extremity as well.   IMPRESSION:  1.  Cervicalgia.  2.  Status post C5-6 fusion.  3.  C4-5 end plate spurring with Lee effacement of the thecal sac.  4.  Upper extremity radiculopathy, predominantly left side, somewhat right      side.  5.  Lumbar facet arthrosis.  6.  Mild canal narrowing at L4-5, L5-S1.   PLAN:  Will refill MS Contin 15 mg 1 p.o. q.8h. #90. She would like to  follow up with Dr. Stevphen Rochester at some point; however, she is unable to afford  this intervention at the current time. She is still in the midst of trying  to get disability. We will see her back then in a month. She is currently  stable. She is not actively suicidal. She is tolerating her medication well.  Constipation is controlled with Dulcolax and the generic MiraLax.       DMK/MedQ  D:  05/13/2005 17:20:43  T:  05/14/2005 08:33:59  Job #:  [id number]

## 2011-03-20 NOTE — Assessment & Plan Note (Signed)
Allison Lee is a 55 year old, married, white female.  She is a patient of Dr.  Hal Hope and Dr. Nolen Mu.   She is back in for a very brief recheck.  She was seen last one week ago.  At that time, she had had some problems with her finances and was unable to  afford her psychiatric medications and had gone off of them and was feeling  quite depressed and she returned to our clinic last week stating she would  be unable to afford her Avinza.   At that time she was switched over from Avinza to methadone and she is back  in for a recheck to determine her response to the methadone today.  She  reports overall she is feeling much better than she did last week.  She  denies any suicidal ideation today.  She does not feel that the methadone is  working as well for her in the morning, but she did not suffer any problems  with withdrawal symptoms.   She is back in today for a refill of her medications.  She was given only a  week's worth at last visit.   She reports her pain is still located in the neck and radiates down the  upper extremities, worse on the left than on the right.  She describes her  pain as a 10 on a scale of 10.  Pain is fairly constant, sharp, burning,  stabbing, tingling in nature.  Overall, sleep has been poor.   Pain has been worse with bending, walking, sitting, standing and improves  with rest.  She reports very little relief at this time with the  medications.  She is able to walk approximately 5 minutes without  assistance.  She is able to climb stairs and she does not drive.  She has  been disabled since October 2004 and needs assistance with bathing, meal  prep, household duties and grocery shopping.  She denies any problems with  bowel or bladder control.  She does admit to depression/anxiety.  However,  denies any suicidal ideation at this time.   She has no new medical problems since last visit.  No changes in her social  or family history since last visit.   On brief exam today, blood pressure is 93/60; pulse 89; 97% saturated on  room air.  She is oriented times 3.  She still appears somewhat depressed.  However, much improved since last visit.  She is able to walk in the room  and her gait is stable.   IMPRESSION:  1.  Cervicalgia.  2.  Status post C5-6 fusion.  3.  A C4-5 end plate spring with slight effacement of the thecal sac.  4.  Upper extremity radiculopathy.  5.  Lumbar spondylosis with disc degeneration.  6.  Facet arthrosis.  7.  Mild canal narrowing at L4-5, L5-S1 per magnetic resonance imaging done      on January 08, 2004.   PLAN:  In light of her elevated liver function tests, she is being weaned  off the Zanaflex.  She should be off within the next 10 days.  We will  recheck her liver function tests next month.  She had a mildly elevated  alkaline phosphatase at 129, normal range between 39 and 117.  We will  increase her methadone slightly.  She is currently taking methadone 5 mg  every 6 hours.  We will increase her morning dose to 10 mg in the morning  followed by 5  mg at noon, at 6:00 p.m. and at night for a total of 25 mg per  day.  We will see her back in 2 weeks to see how she is doing.  I have asked  her to maintain contact with Dr. Nolen Mu as well as Dr. Hal Hope for other  problems and we  will send a copy of my note today over to Dr. Nolen Mu as well as Dr. Nadyne Coombes.  Would also like to add either Neurontin or Lyrica next visit if  her finances would allow that.       DMK/MedQ  D:  11/19/2004 16:52:06  T:  11/19/2004 18:22:50  Job #:  [id number]   cc:   Andee Poles, M.D.  [address]  [place], [place] [address]  Fax: [phone]   Marcos Eke. Hal Hope, M.D.  [address] [place]  [place] [address]  Fax: [phone]

## 2011-03-20 NOTE — Assessment & Plan Note (Signed)
Allison Lee is a 55 year old married female who is being seen in our pain  and rehabilitative clinic for cervicalgia.  She is status post a C5-6 fusion  and has a history of some intermittent bilateral arm pain, as well.   She is back in today for refill of her medication.   She has been quite stressed recently.  Her mother has been quite ill.  Her  brother has cancer and hospice is currently caring for him.  There are some  family stressors regarding the sister-in-law, as well.  Patient tells me she  has not been sleeping well and has quite a bit of anxiety; however, denies  frank depression or suicidal ideation today.   Her average pain is about an 8 on a scale of 10.  She gets a little relief  with the current meds that she is on.  She has been tolerating them well, no  adverse events have been noted with the medications that she takes.  She  uses Ultracet about 3 times a day and MS Contin 3 times a day, as well.   Pain is typically located in the cervical region and then it radiates into  the left arm, intermittently, also had some low back pain with radiation to  the left leg.   She is able to ambulate about 5 minutes at a time.  She is climbing stairs.  She drives.  She has been assisting with the care of her mother and has been  trying to visit her sick brother, as well.  She reports occasionally needing  assistance with dressing and bathing; independent with feeding, toileting.   Health and history forms are reviewed today regarding review of systems.   No changes, otherwise, with past medical, social or family history.   EXAM:  VITAL SIGNS:  Blood pressure 116/64, pulse 85, respirations 17, 97%  saturated on room air.  GENERAL:  She is a well-developed, well-nourished female who appeared her  stated age.  NEUROLOGIC:  She is oriented x3.  Her affect is alert.  She is cooperative  and pleasant.  She does get a bit tearful when she discusses her brother and  his illness,  but she brightens up at other parts of the interview, as well.  She transitions from sit to stand without difficulty.  Her gait is normal.  Her balance is good.  Romberg test is negative.  She has significant  limitations of rotation right and left in her cervical spine.  Also,  limitations to about 110 degrees, bilaterally, shoulder abduction.  Her  motor strength is good throughout upper and lower extremities.  Reflexes are  symmetric and intact in the upper and lower extremities.  No abnormal tone  is noted.  No clonus is noted.   IMPRESSION:  1.  Lumbago.  2.  Cervicalgia, status post C5-6 fusion.  3.  Lumbar spondylosis.  4.  Anxiety.   PLAN:  We will refill the following medications for her today:  MS Contin 15  mg 1 p.o. t.i.d. #90, and Ultracet 37.5/325 1-2 p.o. t.i.d. p.r.n. back or  neck pain #180.  No refills on either medication.  She is doing well on  these medications; has had no adverse problems with them.  She takes them  appropriately.  No aberrant behavior has been  noted.  Today, I recommend that she follow up with her psychiatrist Dr. Serita Grit,  who is in [place].  I would like her to maintain closer contact while she  is going through the illnesses of her mother and her brother.           ______________________________  Brantley Stage, M.D.     DMK/MedQ  D:  02/22/2006 13:46:42  T:  02/23/2006 15:10:14  Job #:  [id number]

## 2011-03-20 NOTE — Assessment & Plan Note (Signed)
HISTORY:  Allison Lee was last seen October 03, 2004.  She is back in for a  recheck and a refill of her medications.  She reports that she has had  difficulty with her insurance and has essentially lost her insurance at this  point and is having trouble financially paying for her medications.   She is requesting to be taken off the Avinza and placed on something less  expensive at this point, apparently she was unable to afford her medications  last week.  She is on Lexapro, Seroquel, Wellbutrin and as she had stopped  these she felt she was somewhat suicidal last week, however, she has been  able to get samples of these medications and has done well again over the  last week once she has been back on her psychiatric medications.  She  continues to have neck pain and left upper extremity radiating pains which  she describes as burning and sharp and stabbing, intermittently tingling.  Her average pain is about a 9 on a scale of 10.  It completely to  significantly interferes with her activity and enjoyment of life.  Her time  of day which is worse for her is morning and daytime.  Her sleep is overall  fair.  Pain is worse with walking, bending, sitting, standing, basically any  type of activity where she needs to keep her head up for a long time.  Improves with rest and medication.  She gets a little bit of relief with her  pain meds.   FUNCTIONAL STATUS:  She is able to walk about 15 minutes at a time.  She is  able to go up and down stairs.  She is able to drive.  She is currently not  employed; she was last employed in March 2004; she was disabled in October  2004.  She requires assistance with bathing, meal prep, household duties,  and shopping.   NEUROPSYCHIATRIC EVALUATION:  Questioned specifically about suicidal  thoughts; she did have these thoughts last week after she was off of her  psychiatric medications, however, does not feel she is suicidal at this  time.   She denies  any changes in her past medical history otherwise or social  history or family history.   EXAMINATION:  Blood pressure 103/67, pulse 89, respirations 20, 98%  saturated on room air.  She is a thin adult female in no apparent distress,  oriented x3, she is alert, affect is flat.  Gait is otherwise normal.  She  has some limitations in cervical range of motion as well as lumbar range of  motion today.  Her reflexes are however, 2+ at biceps, triceps,  brachioradialis, 2+ at knees and ankles, toes are downgoing, no clonus is  noted.  Motor strength is in the 5/5 range.  No focal weakness is noted.  Tenderness in the cervical paraspinal musculature.   IMPRESSION:  1.  Cervicalgia.  2.  Status post C5-6 fusion.  3.  C4-3 end plate spurring with slight effacement of thecal sac.  4.  Left upper extremity radiculopathy.  5.  Lumbar spondylosis, C6 disc degeneration.  6.  Facet arthrosis.  7.  Mild canal narrowing at L4-5 and L5-S1.   PLAN:  Liver function tests at last visit were slightly high, need to taper  the Zanaflex, she is currently not taking this at this point.  She would  like to be off Avinza since its cost is prohibitive for her.  We will switch  her to  methadone 5 mg one by mouth q.6h. (#28) will be given.  We will see her back  in a week and reevaluate her status at that time as well.       DMK/MedQ  D:  11/12/2004 16:55:43  T:  11/12/2004 20:01:33  Job #:  [id number]

## 2011-03-20 NOTE — Assessment & Plan Note (Signed)
HISTORY OF PRESENT ILLNESS:  Allison Lee is pleasant 55 year old  married woman who is followed in our [facility] for  chronic pain syndrome with multiple chronic pain.  Complaints of  cervicalgia, lumbago, left upper extremity pain, left lower extremity  pain both are felt to be partially neuropathic in nature.  She has also  recently undergone a left lateral ankle resection for ligamentous  instability by Dr. Chaney Malling.   Allison Lee is back in today for refill of her pain medication.  She is  requesting MS Contin 30 mg b.i.d. and also Flexeril 5 mg twice a day as  well.   In the interim, she continues to have some left lateral foot pain.  She  has been following up with Dr. Chaney Malling for this.  He recommended she  start wearing her boot again.   Her average pain about 9 on scale of 10.  Pain is worse with walking and  sitting.  Improves with rest medication.  She currently gets a little  relief with current meds.   FUNCTIONAL STATUS:  As follows.  She can walk about 5 minutes at a time.  She is able to climb stairs.  She is able to drive.  She is independent  with self-care.  Needs some assistance occasionally with dressing.   Admits to occasional bladder problems, numbness, tingling, spasms,  trouble walking, depression, and anxiety.  Denies suicidal ideation.  Admits occasional constipation as well.   No other changes in past medical, social or family history since last  visit.   PHYSICAL EXAMINATION:  Blood pressure is 114/74, pulse 83, respiration  18, 100% saturated on room air.  She is a well-developed and well-  nourished woman who does not appear in any distress.  She is oriented  x3.  Speech is clear.  Affect is bright.  She is alert, cooperative and  pleasant.  Follows commands without difficulty.  Answers my questions  appropriately.   Cranial nerves and coordination are intact.  Reflexes are 2+ at patellar  and Achilles tendons  bilaterally.  Diminished sensation is noted along  the dorsum of the right foot and the plantar surface of the left foot.  Her motor strength reveals weakness in bilateral EHL and unable to fully  test motor strength of the left ankle, dorsiflexors or plantar flexors  due to recent surgery.  Straight leg raise appears to be negative.  However, she has good strength with hip flexion bilaterally.  She does  seem to have some left weakness in the external rotators of the left  hip.   Gait is antalgic due to recent left ankle surgery.   IMPRESSION:  1. History of lumbar degenerative disk disease, facet arthropathy, and      chronic left neuropathic leg pain.  2. History of cervical spine degenerative disk disease and facet      arthrosis.  3. Status post C5-C6 fusion, Dr. Channing Mutters.  4. Status post left ankle ligamentous reconstruction Dr. Chaney Malling in      May 14, 2009 with plans to follow back up with Dr. Chaney Malling  in 3      weeks.   PLAN:  We will refill her MS Contin 30 mg one p.o. b.i.d. #60; Flexeril  5 mg not more than twice a day.   We will see her back in a month.  We will obtain MRI to evaluate her  lumbar spine, given that she is having increased left lower extremity  pain.  Pain is rather constant in nature rather unrelenting, although it  may be related to  her ligamentous surgery recently.  I am wondering if this may also be in  part to some neuropathic pain from an L5 root.  We will see her back in  a month.           ______________________________  Brantley Stage, M.D.     DMK/MedQ  D:  07/17/2009 14:24:10  T:  07/18/2009 06:00:32  Job #:  [id number]

## 2011-03-20 NOTE — Assessment & Plan Note (Signed)
Allison Lee is a 55 year old married white female who is being seen in pain  and rehabilitative clinic for chronic low back pain and neck pain.   She is status post C5-6 fusion and is also known to have lumbar spondylosis.   She is back in our clinic today for refill of the medications.  She states  her average pain in her shoulder and arm on the left as well as left lower  extremity and low back is about an 8 to 9 on a scale of 10.  The pain is  described as sharp, burning, stabbing, and tingling. Sleep tends to be poor.  She states that pain is particularly worse with walking, bending, sitting,  standing, anything that uses her arms or anything when she is up on her  legs. She reports pain improves with rest and medication. She gets a little  relief with the current medications that she is on.   She states that she can walk about five minutes at a time. She has  difficulty with stairs at this point, is not really driving at this point.   Health and history form are reviewed today. She requires some assistance  with dressing, bathing, household duties, mail prep, shopping, addresses  some anxiety, still quite upset regarding the death of her brother.   Regarding her medical history, she states that she is being worked up for  some chest pain.  She has had EKG.  Currently GI etiology is being  entertained.  She is currently undergoing workup yet.  I do not have any  notes regarding this, however.   Otherwise, social and family history are unchanged.  Continues to smoke half  pack of cigarettes per day.   Blood pressure 108/63, pulse 92,  respirations 16, 98% saturation on room  air.  She is a well-developed, well-nourished female who appears her stated  age. She is oriented times three. Affect is bright and alert.  She is  cooperative and pleasant.  She is somewhat tearful when she talks about her  brother, however.   She transitions from sit to stand without much difficulty.  Gait  in the room  is antalgic.  Romberg test is negative.  Tandem gait is within normal  limits.  Reflexes are symmetric in the upper and lower extremities. They are  intact. No clonus is noted. No abnormal tone is noted.  Her motor strength  is good in the upper and lower extremities.  No focal weakness is observed.  She has decreased rotation and extension in her cervical spine with active  motion.   IMPRESSION:  1.  Cervicalgia, status post C5-6 fusion.  2.  Lumbago.  3.  Lumbar spondylosis.  4.  Reactive depression/anxiety.   PLAN:  Will refill her MS Contin 15 mg one p.o. q.8h. #90, Ultracet 37.5 one  to two p.o. t.i.d. p.r.n. back or neck pain #180.  Will see her back in a  month.  She is currently stable on these medications.  She is not suicidal  at this time. She is somewhat anxious. She is able to maintain function for  the most part with these medications as well.  We will see her back in two  months, nursing visit in a month.           ______________________________  Brantley Stage, M.D.    DMK/MedQ  D:  05/19/2006 15:17:06  T:  05/19/2006 20:30:04  Job #:  [id number]

## 2011-03-20 NOTE — Op Note (Signed)
NAME:  Allison Lee, Allison Lee               ACCOUNT NO.:  [id number]   MEDICAL RECORD NO.:  [id number]          PATIENT TYPE:  AMB   LOCATION:  [facility]                         FACILITY:  [facility]   PHYSICIAN:  Malachi Pro. Ambrose Mantle, M.D. 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   OPERATIONS:  CO2 laser cone ablation of the cervix, endocervical curettage,  endometrial curettage.   OPERATOR:  Malachi Pro. Ambrose Mantle, M.D.   ANESTHESIA:  General anesthesia.   The patient was brought to the operating room and placed under satisfactory  general anesthesia and placed in lithotomy position. The exam revealed the  vulva and vagina to be clean, the cervix was clean, the uterus was anterior  small, the adnexa were free of masses. The vulvar was prepped with Betadine  solution. The cervix was exposed with an ebonized Graves speculum. The  cervix was painted with 5% acetic acid. Colposcopy did not see showed the  squamocolumnar junction and there were no external abnormalities. A cone was  outlined with a CO2 continuous 10 watts and then the cone was done with a  super pulse at 13 watts and the top of the cone was cut off with the  scissors. During the removal of the cone because it was so high and the  small vagina, the cone actually was divided but I do not know exactly where  it was divided so I was unable to tell at what area it was cut. There was a  perfect cone, the bed was treated with a defocused beam at 10 watts for  hemostasis and endocervical and endometrial curettage was then done  producing almost no tissue. The cervix was dilated so that I could admit a  small curette and the uterus sounded to no more than 7 cm. The cervix was  then reapproximated with four sutures of #0 chromic suture going from  something like 11 o'clock to 1 o'clock, 5 o'clock to 7 o'clock, 2 o'clock to  4 o'clock and 8 o'clock to 10 o'clock. The cervix was sounded at the end of  the procedure and it was a patent canal. The patient was returned to  recovery in satisfactory condition. Blood loss was no more than 10 mL. It  should be noted that prior to doing the cone, I did inject the cervix with  10 mL of the solution created by mixing 10 units of Pitressin and 120 mL of  saline.      Malachi Pro. Ambrose Mantle, M.D.  Electronically Signed     TFH/MEDQ  D:  11/17/2005  T:  11/17/2005  Job:  [id number]

## 2011-03-20 NOTE — Assessment & Plan Note (Signed)
Ms. Pankonin is a 55 year old married white female who is being seen in our  Pain and Rehabilitative Clinic for chronic low back pain and left leg pain  as well as cervicalgia and bilateral arm pain.   She is status post cervical fusion C5-6.   She is back in today for a refill of her medications.  Overall, her average  pain is about a 9 on a scale of 10.  She has, however, been quite active.  She has been helping care for her sick mother.  Sleep has been poor for her  overall.  She gets very little relief with the medications at this point.   Pain improves with rest.  Worsens with activities for the most part,  anything that increases her upper extremity use makes her worse.   She can walk several hours a day and she is doing that.  She is able to  climb stairs.  She is currently driving.  She needs assistance with  buttoning buttons down her back or zippers down her back.  Her husband  assists her with washing her hair, otherwise she is independent with her  self care.   No new changes in her past medical, social or family history since our last  visit.  She continues to smoke about a half pack of cigarettes per day.   PHYSICAL EXAMINATION:  GENERAL APPEARANCE:  She is a well-developed, well-  nourished female who appears her stated age.  She is oriented x3.  Affect is  bright, alert, cooperative and pleasant.  She does occasionally appear  somewhat depressed, however, during our interview but brightens up at  varying intervals.  VITAL SIGNS:  Blood pressure 103/74, pulse 79, respirations 17, 100%  saturated on room air.  NEUROLOGIC:  She is able to stand.  Her gait is stable.  She has a normal  base of support, normal heel-toe mechanics.  Balance is good.  Seated  reflexes are symmetric and intact upper and lower extremities.  No abnormal  tone is noted.  No clonus is noted. She is very limited to motion in her  cervical spine, very limited motion in the shoulders bilaterally.   She can  abduct between 45 and 50 degrees with both arms.  Motor strength, however,  is good throughout upper and lower extremities.   IMPRESSION:  1.  Lumbago.  2.  Cervicalgia, status post cervical fusion C5-C6.  3.  Lumbar spondylosis.  4.  Depression.  5.  Anxiety.   PLAN:  Patient discontinued Lyrica because it made her dizzy.  She does not  tolerate Topamax.  She has been trialed with Neurontin that was not helpful  either.   Will refill her MS Contin 15 mg one p.o. q.8h. #90.  Will trial her with  some Ultracet 37.5/325 one to two p.o. t.i.d. p.r.n. back or neck pain.  Will start her out with one pill a day to see how she tolerates this.  She  has quite a bit of medication sensitivities though.  If she tolerates it,  she can increase it up to two tablets three times a day.  We will  give her 180.  We will see her back in a month.  She has displayed no  aberrant behavior, is using medications appropriately, is not suicidal.           ______________________________  Brantley Stage, M.D.     DMK/MedQ  D:  01/27/2006 12:25:25  T:  01/28/2006 16:44:19  Job #:  [id number] 

## 2011-03-20 NOTE — Assessment & Plan Note (Signed)
Allison Lee is a 55 year old married white female who has been seen in our  pain and rehab clinic for chronic cervicalgia and lumbago with radiating  pain to the left upper extremity and left lower extremity.   She was last seen on August 13, 2006 in our clinic.  She is back in today  for refill of her medications.  She has no new complaints.  She states that  her average pain is about an 8/10.  The pain is described as burning, sharp,  tingling, but intermittent.  Sleep has been poor lately.  She states that  her pain is typically worse in the morning, daytime, and towards the  evening.  The pain is worse with various activities, especially when she is  using her arms.  Improves with rest and medication.  She gets a little  relief with the current meds that she is on.   She can walk about five minutes at a time.  She is able to climb stairs.  She has been able to drive.  She has been assisting taking care of some  elderly parents.  She does need assistance with bathing and dressing and is  independent with feeding and toileting.  She also requires assistance with  higher level household duties.   Health and history form is positive for anxiety, spasms, numbness and  tingling.  She has been on Mirapex recently and has discontinued it.  This  is prescribed by her primary care doctor.  She states that she discontinued  because she had some limb swelling.   No new changes in her past medical, social, or family history since our last  visit.  She smoked a half pack of cigarettes a day.   PHYSICAL EXAMINATION:  VITAL SIGNS:  Blood pressure 96/70, pulse 110,  respirations 16, 100% saturated on room air.  GENERAL:  She is a well-developed and well-nourished female who appears her  stated age.  She does not appear in any distress.  She does appear quite  tired.  She is oriented x3.  Her affect is alert.  She is cooperative and  pleasant.  NEUROMUSCULAR:  Her speech is clear.  She follows  commands without any  problems.   She transitions from sit to stand easily.  Her gait in the room is slow but  normal.  No antalgia is appreciated.  Romberg's test and tandem gait are  within normal limits.  She has significant limitations in shoulder range of  motion as well as cervical range of motion, especially with rotation,  flexion and extension.  Left upper extremity is limited to approximately 100  degrees of abduction, and the right shoulder is limited to between 80-90  degrees of abduction.  She complains of pain during these maneuvers as well.   Seated, her reflexes are symmetric in the upper extremities, 2+ throughout.  Lower extremities, 2+ patellar tendons, 0 at the Achilles tendons.  No  abnormal tone is appreciated.  Motor strength is good throughout without  focal weakness.   IMPRESSION:  1. Lumbago.  2. Cervicalgia, status post C5-6 fusion.  3. 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  4. Depression.  5. Anxiety.  6. Lumbar spondylosis.   PLAN:  We discussed pursuing epidural injections or medial branch blocks  with her today.  She is not interested in any kind of invasive treatment at  this point.  She would like to have her MS Contin refilled 30 mg 1 p.o.  b.i.d. #60 and Flexeril 5 mg 1 p.o. t.i.d. p.r.n. spasm in the neck and  back, #90.  Also, will start her on some trazodone.  She has been on this  before.  She states that it does help with her insomnia.  We will start her  at 50 mg 1 p.o. nightly #30.  Will see her back in a month.  She does not display any aberrant behavior,  uses her medications appropriately, and is able to maintain a fair degree of  good function on it.           ______________________________  Brantley Stage, M.D.     DMK/MedQ  D:  09/08/2006 14:16:09  T:  09/08/2006 22:51:30  Job #:  [id number]

## 2011-03-20 NOTE — Assessment & Plan Note (Signed)
Allison Lee is a 55 year old married white female who is being seen in  our Pain and Rehabilitative Clinic for multiple chronic pain complaints;  including cervical, lumbago, and bilateral extremity and left lower  extremity pain.  She was last seen by me on December 01, 2006.  She is  back in today and states that she has been having to take care of her  sick and invalid mother.  She has had to be active in changing sheets  and cleaning up her mother at times.  She feels that this extra work has  been difficult for her and has increased her pain somewhat.  She  describes as a pain of 10 on a scale of 10 localized to the left back,  cervical region, bilateral upper extremities and left lower extremity.  Sleep tends to be more.  Pain is worse with activity.  Anything that has  her using her arms, pain improves with rest and medication.  She is  currently getting a little relief with the meds that she is on.  Medications from this clinic include Flexeril 5 mg up to 3x a day.  MS  Contin 30 mg twice a day.  Ultracet 1 tablet 2-3 times a day.   She is able to walk about 5 minutes at a time.  She is able to climb  stairs.  She is driving.  She is independent with her self care with the  exception of zipping up zippers in her back and she has some difficulty  washing her hair.  She admits to depression and anxiety, but denies any  suicidal ideation at this time.  She does have some problem with  numbness, tingling, and spasms in the upper and lower extremities.  She  also admits to constipation and some weight gain.  She was recently seen  by Dr. Tresa Endo __________  and was started back on some Cymbalta, again,  and feels that she is doing okay with this new medicine as well.   She does continue to smoke 1 pack of cigarettes about every 3 days.   PHYSICAL EXAMINATION:  VITAL SIGNS:  Her blood pressure is 103/55, pulse  94, respirations 16, and 99% saturated on room air.  GENERAL:  She is a  well-developed, well-nourished female who appears her  stated age.  She does not appear in any distress today.  She does have  somewhat of a depressed affect.  She is alert and cooperative and  overall pleasant.  Her speech is clear and she follows commands without  any problems.  NEUROLOGIC:  She transitions from sit-to-stand independently.  Her gait  is stable and nonantalgic.  She has limitations in cervical range of  motion as well as lumbar range of motion in all planes.  Motor strength  is in the 5/5 range in the upper and lower extremities.  I do not  appreciate any sensory deficits today.  Tandem gait and Romberg's test  are performed adequately.  Reflexes are symmetric and intact in the  upper and lower extremities.   IMPRESSION:  1. Cervicalgia.  2. Lumbago.  3. Neuropathic bilateral upper extremity pain.  4. Neuropathic left lower extremity pain.  5. Depression.  6. C5-6 fusion with an anterior osteophyte off the inferior portion of      C5 covering the C4-5 disk space in the neutral position per a note      by Dr. Channing Mutters.  7. 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 Channing Mutters as well.   The patient is not felt to be an operative candidate at the time of Dr.  Temple Pacini evaluation back in February 2007.  Should she progress with her  upper extremity symptoms or lower extremity symptoms, may consider  reimaging at some point in the future.  At this point, she has been  fairly stable with her pain complaints.  We will continue to follow her  over the months' ahead and reassess her.  Will refill her medicines for  her today which includes: 1) Flexeril 5 mg up to 3x a day p.r.n. #90; 2)  Ultracet 1 p.o. b.i.d. to t.i.d. #60 per month; and 3) MS Contin 30 mg 1  p.o. b.i.d. #60 per month as well.  She has been stable on these  medications and has not displayed any aberrant  behavior.  She is able to maintain her  functional status which is  independent and actually assisting a dependent mother at this time as  well.  We will see her back in a month.           ______________________________  Brantley Stage, M.D.     DMK/MedQ  D:  12/29/2006 14:21:46  T:  12/29/2006 14:57:41  Job #:  [id number]

## 2011-03-20 NOTE — H&P (Signed)
NAME:  Allison, Lee NO.:  [id number]   MEDICAL RECORD NO.:  [id number]          PATIENT TYPE:  IPS   LOCATION:  0304                          FACILITY:  [facility]   PHYSICIAN:  Jeanice Lim, M.D. DATE OF BIRTH:  1956/09/27   DATE OF ADMISSION:  02/27/2005  DATE OF DISCHARGE:                         PSYCHIATRIC ADMISSION ASSESSMENT   IDENTIFYING INFORMATION:  This is a voluntary admission.  This is a 55-year-  old married white female.  Apparently she was at her physician's office  today where she was reporting increased depression.  She identified several  life stressors contributing to the depression, loss of her job, loss of  medical insurance, medical problems, family illnesses.  She reported feeling  overwhelmed and anxious.  It was recommended that she be admitted.   Apparently the patient was admitted to the main hospital on April 24 to  February 25, 2005.  She had complaints at that time of progressive weight  edema, dyspnea and abdominal swelling.  During the course of her admission  her first problem signs and symptoms including dyspnea and weight gain, she  was evaluated from numerous perspectives.  She was continued on her  spironolactone.  Lasix was also added.  A 12-lead EKG showed some mild sinus  tachycardia.  She did complain of left chest pain without radiation; it was  found to be noncardiogenic.  She does have a history for GERD and chronic  pain.  A DVT and pulmonary emboli were ruled out.  A venous Doppler study  was done.  This was unremarkable.  There was no evidence for pulmonary  emboli, dissection, any acute pulmonary disease.  A 2D ECHO was evaluated to  further monitor her heart function, and again there was no evidence of  abnormal systolic ejection.  She also had abdominal CT, and there was no  intra-abdominal or intrapelvic pathology identified.  Her liver function was  normal, and there were no acute findings.  Hypotension, again one  of her  complaints was syncope or presyncope.  The patient was placed on additional  diuretics.  Her pressures were low.  All diuretics were discontinued.  Her  blood pressures have improved.  They have been in the 90-100 range, and  postural blood pressures find no indication of significant postural blood  pressure.  Complaints of chest pain, again this is a presenting issue.  Again there was no cardiac etiology noted for this.  Her proton pump  inhibitor was continued as it was felt to be related to her GERD.  Again  they felt that after all was said and done, the Seroquel being used to treat  her bipolar disorder might be contributing to her weight gain.   PAST PSYCHIATRIC HISTORY:  She has been seeing Dr. Andee Poles for about  a year.  She has no prior inpatient treatment.  She was sent to [facility] last  year to have a pain evaluation.  She is currently followed by Dr. Pamelia Hoit  for her pain.   SOCIAL HISTORY:  She finished the 12th grade.  She had a horse accident in  2003.  She had been employed as a Tax adviser for [employer].  She began  that job in 2000.  She had to leave work in March 2004 and apparently, they  have just notified her that they are terminating her.  She states that her  insurance will be discontinued as of Sunday.  She has been married x 3.  This particular marriage began in 87.  She has 2 children, a son age 63  and a daughter age 27.   FAMILY HISTORY:  None.   ALCOHOL AND DRUG HISTORY:  She denies any substance abuse issues, although  she does acknowledge smoking 1/2 pack of cigarettes per day.   MEDICAL HISTORY AND PRIMARY CARE Kashius Dominic:  She is followed by Clelia Schaumann,  nurse practitioner, at [facility].   MEDICAL PROBLEMS:  She had a cervical fusion in 2003 by Dr. Danielle Dess.  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   CURRENT MEDICATIONS:  Upon discharge 2 days ago her medications  were  1.  Methadone 10 mg at 6:00 a.m., 5 mg at 12:00, 6:00 p.m. and 10:00 p.m.      daily.  2.  Wellbutrin 150 mg daily.  3.  Vitamin C 500 mg daily.  4.  Seroquel 300 mg at h.s.  5.  Lexapro 10 mg daily.  6.  Nexium 40 mg twice a day.  7.  Glycolax powder daily at bedtime.  8.  Vitamin E 400 mg daily.  9.  Calcium with vitamin D 1200 mg daily.  10. Relpax 40 mg p.o. daily as needed for migraine headache.  11. Fosamax 70 mg weekly.  12. The requested not to restart the spironolactone.   DRUG ALLERGIES:  HYDROCODONE and HYDROCHLOROTHIAZIDE.   POSITIVE PHYSICAL FINDINGS:  Physical examination was not repeated.  It is  well documented and noted on the chart.   MENTAL STATUS EXAM:  She is alert and oriented x 3.  She is appropriately  groomed and dressed and appears to be well nourished.  Her gait and motor  are within normal limits.  She had good eye contact.  Her speech had normal  rate, rhythm and tone.  Her mood is depressed and anxious; however, it is  appropriate to her situation.  Her affect is congruent.  Her thought  processes are clear, rational and goal oriented.  She wants to find a way to  continue her medications and to feel better.  Judgment and insight were  intact.  Concentration and memory are intact.  Intelligence is at least average.  She  emphatically denies suicidal or homicidal ideation.  She denies auditory or  visual hallucinations.   ADMISSION DIAGNOSES:   AXIS I:  1.  Depressive disorder, not otherwise specified.  2.  Chronic pain.   AXIS II:  Deferred.   AXIS III:  Unexplained edema.   AXIS IV:  Severe.   AXIS V:  30.   PLAN:  Admit for stabilization and safety to adjust her medications as  indicated.  Hopefully to get her started on emergency Medicaid so her  medications can be continued once discharged.      MD/MEDQ  D:  02/27/2005  T:  02/27/2005  Job:  [id number]

## 2011-03-20 NOTE — Assessment & Plan Note (Signed)
MEDICAL RECORD NUMBER:  [id number]   DATE OF BIRTH:  1956/06/28   Allison Lee is a 55 year old married white female who is being seen in our  pain and rehabilitative clinic for chronic neck pain. She is back in today  and reports her pain is averaging about an 8 on a scale of 10. Sleep has  been poor lately. She gets little relief with the current medications. She  is able to walk about 10 minutes at a time. She is able to climb stairs,  drive. Pain is going down the left leg a bit for her. She complains of some  numbness in the leg, weakness, tingling. Also admits to depression/anxiety.  Denies suicidal ideation. Occasionally notes limb swelling as well and  weight gain. No other changes noted in past medical, social, or family  history.   Reviewed her lumbar MRI today with her done September 03, 2005, at [facility], no contrast. Showed multilevel degenerative changes. She has an  extraforaminal disc herniation at L3 that could affect the left L3 root. She  has disc degeneration at L4-5 with an anular tear, more prominent in the  left foramina. Mildly narrowing at the lateral recesses bilaterally. They  note there is potential in particular for left L4 root irritation. Also  noted shallow, broad-based disc protrusion at L5-S1 contacting thecal sac in  the S1 roots as they bud from the thecal sac, possible either S1 could be  irritated. They note there may be conjoined roots on the left at L5 and S1  which would place the S1 root at additional risk.   This was reviewed with her.   EXAMINATION:  Blood pressure was 96/70, pulse 92, respirations 16, 98%  saturated on room air. She is a mildly-obese white female, does not appear  in any distress. She is oriented x3. Her affect is alert, cooperative, and  pleasant. She is able to stand. She has limitations in lumbar range, all  planes. Reflexes are 2+ at the knees, 1+ at the ankles. Straight leg raise  is negative. Some numbness in  the medial border of the left foot. Some EHL  weakness on the right compared to the left. Motor strength in the upper  extremities is in the good range, however. Cervical range of motion is  limited as well in both cervical and lumbar spine.   IMPRESSION:  1.  Low back pain and left leg pain with MRI as described above consistent      with symptoms of stenosis.  2.  Cervicalgia.  3.  Status post cervical fusion C5-6.  4.  Depression is currently being followed by Dr. Santiago Bumpers. She recently had      some medication changes with respect to that. She is currently on      Cymbalta and Klonopin at this point.   PLAN:  We discussed management, use of adjuvants with her for about 25  minutes today. She will continue her morphine CR 15 mg one p.o. q.8h. #90,  no refills. Will add ibuprofen 800 mg one time per day, most likely in the  morning. She will take that and she will also take Tylenol or acetaminophen  not more than 2000 mg per day. This was discussed with her and she was given  some written material on using Tylenol as well as ibuprofen. Will see her  back in a month. May consider a surgical referral. She will think about this  as well, possibly having an  epidural steroid injection as well.           ______________________________  Brantley Stage, M.D.     DMK/MedQ  D:  10/02/2005 14:21:09  T:  10/02/2005 16:22:13  Job #:  [id number]

## 2011-03-20 NOTE — Assessment & Plan Note (Signed)
REASON FOR EVALUATION:  Allison Lee is a 55 year old white female who was  last seen in our pain and rehabilitative clinic on June 06, 2004.   At that time, she had been complaining of some abdominal pain as well as her  usual neck pain.  She has since followed up with a GI specialist, has  undergone a colonoscopy and apparently she had a polyp which was biopsied,  and she is waiting for the pathology results on that.   She also brought up the fact that she is interested in possibly obtaining  some disability.  She brings in 2 prior MRI scans which were done by Dr.  Marquette Old. Lewit back in March of 2005 of the cervical and lumbar spines; they  are both without contrast.  I reviewed them with her.  Her lumbar spine per  report by Dr. Frederica Kuster shows that she has some disk degeneration,  facet arthrosis and mild canal narrowing at L4-5 and L5-S1 and without overt  stenosis.  Also, a dorsolateral annular tear is suspected at L4-5.   Her cervical MRI which was done on January 06, 2004 shows status post C5-6  ACDF.  #1 - Some artifact is noted at that particular level due to metal.  #2 - There is some mild increased disk degeneration and posterior bulging at  C4-5 without significant canal or foraminal encroachment.  #3 - She has some  increasing disk degenerative changes at C6-7 since the previous exam, as  well as a shallow central protrusion and mild cervical canal narrowing which  are not appreciably changed.   I go over these scans with her.   She reports her average pain is about a 10 on a scale of 10.  At her last  visit, we switched her from morphine 45 mg q.12 h. to a Duragesic patch 25  mcg per hour.  She feels that a 25-mcg patch has not been successful in  treating her pain at this time.  Her pain scores reflect that on the  average, she describes a 10 on a scale of 10.  Pain is typically located in  both arms, predominantly in the left parascapular region, and also some  pain  down the back of her left leg.   Neurologic status is stable, however.  No new problems with bowel or bladder  control.  No new numbness, tingling, weakness or gait changes.   She is independent with her feeding, dressing, bathing, orofacial hygiene  and meal prep.  She is able to walk 10-15 minutes without any assistance  device.  She does some of the dishes at home, avoids any kind of cleaning  and laundry.  She is able to drive independently, is currently not employed.  She reports her sleep has been poor.   The patient denies any suicidal ideation; she has been seeing Dr. Andee Poles, psychiatrist, for 1 year.  Dr. Nolen Mu recently increased her  Effexor to 400 mg per day.   The patient denies any significant change in medical history, other than  recent colonoscopy and waiting for the pathology results.   EXAM:  VITAL SIGNS:  Blood pressure is 103/63, pulse 100, respirations 18,  saturated 99% on room air.  GENERAL:  On exam today, she is alert, oriented and cooperative, seems  somewhat more tired than usual today, reports her pain has been worse.  NEUROMUSCULAR:  Her neck is limited in all planes.  She has difficulty  abducting arms above  the 90-degree range bilaterally.  She is able to get  out of the chair.  Her gait in the room is normal.  Seated, reflexes are 2+  at the biceps, triceps and brachioradialis bilaterally; 2+ at the knees and  ankles bilaterally.  Toes are downgoing.  No clonus is noted.  Sensation is  intact.  Motor strength is 5/5 in upper and lower extremities.   IMPRESSION:  1.  Cervicalgia.  2.  Status post C5-6 fusion.  3.  C4-5 endplate spurring with slight effacement of thecal sac.  4.  Upper extremity radicular pain.  5.  Lumbar spondylosis with disk degeneration, facet arthrosis and mild      canal narrowing at L4-5 and L5-S1 per MRI scan done January 08, 2004.   PLAN:  We will increase her Duragesic from 25 mcg per hour to 50 mcg per   hour; we will give her #10 and see her back in 1 month.  We also discussed  the possibility of doing medial branch blocks with her in the future; we  will discuss this further at our next evaluation.      Brantley Stage, M.D.   DMK/MedQ  D:  07/09/2004 12:49:22  T:  07/10/2004 06:48:43  Job #:  [id number]
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
## 2011-03-20 NOTE — Assessment & Plan Note (Signed)
Allison Lee is a 56 year old married white female who is being seen in our  pain and rehabilitative clinic for a recheck and refill of her medications.  She is accompanied by her daughter today.   She is being seen in our clinic for chronic neck and low back pain.  She  also has left leg pain, both right and left arm pain.   She describes her neck and left shoulder pain as being intermittent, varying  between a 6 and an 8 on a scale of 10.  This is the worst discomfort.  She  also has left arm pain.  It can go up to a 10 and is often around a 7.  It  varies a good deal with activity levels.  Her right arm is not as bad.  She  describes it as between a 0 and a 7 and varies dependent upon activity.  Her  low back is described as about an 8 on a scale of 10 and is fairly constant.  Her left leg varies between a 0 and a 6 and again varies with some activity  levels.  Pain generally described as sharp, burning, tingling, stabbing,  aching.  Her relief with medications is fair when she is not active.  She is  able to walk about 15 minutes at a time.  She is able to climb stairs.  She  does drive if she needs to.  She was last employed back in March 2004.  Has  been disabled since October 2004.   Positive review of systems for weakness, numbness, tingling, trouble  walking, spasms, depression, anxiety.  Denies any suicidal ideation today.  She reports that she is comfortable with her psychiatric medications that  she is currently taking and her doctor as well, her psychiatric doctor.   Other review of systems:  Positive for weight gain, constipation, abdominal  pain, limb swelling, though she reports significant improvement in her limb  edema since she has discontinued the methadone.   Past medical, social and family history unchanged since last visit.   PHYSICAL EXAMINATION:  VITAL SIGNS:  Blood pressure 113/65, pulse 100,  respirations 16, 96% saturated on room air.  GENERAL:  A  well-developed, well-nourished woman.  She appears alert today  although still somewhat depressed and tired.  Oriented x3.  MUSCULOSKELETAL/NEUROLOGIC:  Is able to stand.  Her gait is normal.  She has  good balance.  Romberg test is negative.  She has limitation in cervical  range in all planes, limitations in lumbar range in all planes.  She has  intact reflexes at biceps, triceps, brachioradialis, intact and symmetric in  the lower extremities as well, 2+ at the patellar tendon and Achilles, and  toes are downgoing.  No abnormal tone is noted.  She has some give-away  weakness at the left great toe and dorsiflexors on the left.   IMPRESSION:  1.  Cervicalgia.  2.  Status post C5-6 fusion.  3.  C4-5 end plate spurring with slight effacement of the thecal sac.  4.  She has an upper extremity radiculopathy predominantly on the left side,      somewhat on the right side, not as bad.  5.  Lumbar facet arthrosis.  6.  Mild canal narrowing at L4-5 and L5-S1.   She is being treated using modalities, heat, a TENS unit.  She paces her  activities.  She has been using MS-Contin 15 mg one p.o. q.8h., #90.  She  reports it  gives her fair relief if she is not real active.  She has been  trialled with Topamax.  She had a relative contraindication because of her  osteoporosis, and also she did not believe that it helped her pain that much  as well.  She has also been trialled with Neurontin up to 3600 mg and this  was also not helpful.  She has trialled trigger points, which were also not  helpful.   Her depression appears to be stable at this point.  She is satisfied with  her psychiatric caregivers and current medications.  She is currently not  actively suicidal.  She did see a neurosurgeon a couple of months ago, Dr.  Laural Benes, who told her she was nonoperable.  I would like her to get a copy  of his note for our chart.  Her pain continues to be axial as well as having  a neuropathic component.   We would like her to follow up with Dr. Stevphen Rochester if  this is possible for some cervical medial branch facet blocks.  Will see her  back in a month.  Not actively suicidal at this time.       DMK/MedQ  D:  04/10/2005 11:35:46  T:  04/10/2005 14:22:54  Job #:  [id number]

## 2011-03-20 NOTE — Assessment & Plan Note (Signed)
Allison Lee is a 55 year old white female who is being seen in our clinic for  a refill of her medications.  She has a history of a C5-C6 fusion, history  of C4-C5 end plate spurring, and slight effacement of her thecal sac, upper  extremity radicular pain, cervicalgia, and lumbar spondylosis with disk  degeneration, facette arthrosis, and mild canal narrowing at L4-L5 and L5-  S1.  She is in today for a refill of her medications.  We discussed her  course over the last several months.  She feels that she has had a downward  spiral in her functional ability over the last half a year or so.  About six  months ago, she was able to be up between 45 minutes and an hour doing the  dishes and able to vacuum.  Currently, she is limited to approximately 5-10  minutes at a time doing any kind of functional task such as the dishes.  She  appears depressed and overall disheartened by her current functional status.  She is being seen by Andee Poles, M.D., psychiatrist, who is managing  her psychiatric medications.  Allison Lee has been placed on Avinza.  She is  tolerating it well.  She reports she has had some problems with some  constipation but denies over sedation.  She denies any bowel or bladder  control problems.  Denies any weakness or gait changes.  She does report  when she walks for any period of time her low back does begin to hurt.  She  is able to drive.  She spends a good part of the day doing sedentary  activities such as watching TV.  Sleep has overall been poor.  Denies any  suicidal ideation.  Reports no new changes in her medical history.   PHYSICAL EXAMINATION:  GENERAL:  She is alert, oriented, somewhat depressed  appearing, thin female.  VITAL SIGNS:  Blood pressure is 94/65, pulse 93, respirations 22, and 99%  saturated on room air.  MUSCULOSKELETAL:  She is able to get out of the chair.  Her gait in the room  is normal.  Romberg test is negative.  She has diminished range  of motion in  her cervical spine with rotation, extension, and flexion.  She has full  shoulder range of motion with increased pain in the neck with these  maneuvers.  She reports decreased sensation in the C6 dermatome on the left.  Her reflexes are 2+ at the brachial radialis bilaterally, 2+ at the biceps,  and 2+ at the triceps, 2 to 3+ at the knees, and 2+ at the ankles.  No  clonus is noted.  Motor strength overall is good in the 5/5 range without  obvious focal weakness.   IMPRESSION:  1.  Cervicalgia.  2.  Status post C5-C6 fusion.  3.  C4-C5 end plate spurring with slight effacement of thecal sac.  4.  Left upper extremity radicular symptoms.  5.  Lumbar spondylosis with disk degeneration.  6.  Facette arthrosis.  7.  Mild canal narrowing at L4-L5 and L5-S1, per magnetic resonance imaging      which was done on January 08, 2004.   PLAN:  1.  We will refill her Avinza 90 mg one p.o. every day, #30.  2.  Recommend Dulcolax tablets two p.o. q.h.s. p.r.n.  3.  We will refill Tizanidine 2 mg one p.o. q.h.s., #120.  4.  We will have her follow up with neurosurgery.  She has seen Dr.  Elsner      in the past.  She is requesting for another opinion.  Will need to find      somebody that she can follow up with.  Apparently, she would like to see      somebody who is in that group but since she has seen Dr. Danielle Dess in the      past, they are requesting she follow back up with him again.  5.  We will see her back then in one month.  6.  Consider medial branch blocks.  Discussed this again with her.  She is      considering having either that or epidural injection; however, she would      like to follow up with neurosurgeon prior to any kind of injection.  We      will probably recommend [facility], Dr. Wynetta Emery, Dr. Jordan Likes, or      Dr. Gerlene Fee or [facility], Dr. Elesa Hacker.       DMK/MedQ  D:  09/03/2004 17:51:22  T:  09/03/2004 21:38:46  Job #:  [id number]

## 2011-03-20 NOTE — Discharge Summary (Signed)
NAME:  Allison Lee, Allison Lee NO.:  [id number]   MEDICAL RECORD NO.:  [id number]          PATIENT TYPE:  IPS   LOCATION:  0304                          FACILITY:  [facility]   PHYSICIAN:  Jeanice Lim, M.D. DATE OF BIRTH:  [birth year]   DATE OF ADMISSION:  02/27/2005  DATE OF DISCHARGE:  03/02/2005                                 DISCHARGE SUMMARY   IDENTIFYING DATA:  This is a 55 year old married Caucasian female presenting  with increased depression, several life stressors, loss of job, loss of  medical insurance, medical problems, family issues, overwhelmed, anxious,  progressive weight, edema, dyspnea, abdominal swelling.  She was evaluated  medically containing on spironolactone.  Lasix was added.  EKG showed mild  sinus abnormalities, GERD, chronic pain, DVT, pulmonary emboli were ruled  out.  Two-D echo was done.  Hypotension occurred.  Syncope or presyncope  were some of her complaints.  The patient complained of chest pain multiple  times and they felt that the entire workup had been completed and that the  Seroquel being used to treat her bipolar might be contributing to her weight  gain.   PAST PSYCHIATRIC HISTORY:  Had been seen by Dr. Andee Poles for about a  year.  Had no prior inpatient treatment.  Had been at [facility] last year for  pain evaluation and followed by Dr. Pamelia Hoit for pain.  Does acknowledge  smoking a half a pack of cigarettes per day.  Followed by Saul Fordyce,  nurse practitioner, senior care, over at [facility].   MEDICAL HISTORY:  Cervical fusion in 2003 by Dr. Danielle Dess, status post stent  and kidney, ischemic colitis and polyps history.   CURRENT MEDICATIONS:  Upon discharge two days, medications were methadone  10, 5 and 10, Wellbutrin 150 mg, vitamin C 500, Seroquel 300 mg at  nighttime, Lexapro 10 mg q.a.m., Nexium 40 mg twice a day, GlycoLax powder  at bedtime, vitamin E daily, calcium with vitamin D 1200 daily, Relpax 40 mg  daily  p.r.n. migraine, Fosamax 70 mg weekly and requested not to restart  spironolactone.   ALLERGIES:  HYDROCODONE and HYDROCHLOROTHIAZIDE.   PHYSICAL EXAMINATION:  Physical and neurologic exam essentially same as  documented in the chart.   MENTAL STATUS EXAM:  Alert and oriented.  Appropriately groomed.  Appeared  well-nourished.  Gait within normal limits.  Good eye contact.  Speech  within normal limits.  Mood depressed, anxious, appropriate to situation.  Thought processes were clear.  Wants to find a way to continue her  medications and feel better.  Cognition was grossly intact and judgment and  insight were fair.  Emphatically denied any suicidal or homicidal ideation.   ADMISSION DIAGNOSES:   AXIS I:  1.  Depressive disorder not otherwise specified.  2.  Chronic pain.   AXIS II:  Deferred.   AXIS III:  Unexplained edema.   AXIS IV:  Severe (multiple medical problems, unclear etiology).   AXIS V:  30/60.   HOSPITAL COURSE:  The patient was admitted and ordered routine p.r.n.  medications and underwent further monitoring.  Was  encouraged to participate  in individual, group and milieu therapy.  Routine organic workup to rule out  reversible medical etiology versus psychopathology and medical medications  and psychotropics reconciled were then resumed.  Social worker was to  contact emergency Medicaid and help with medications the patient cannot  afford.  Has no drug coverage after Sunday.  The patient was discharged to  continue medications as prescribed.  She was stable from a psychiatric  standpoint.  Her mood was stable, euthymic.  Affect full.  Fully oriented.  No psychotic symptoms.  No suicidal or homicidal ideation.  Eating and  sleeping well with no acute medical complaints.  Aware of the importance of  close follow-up medically and compliance with all of her medications  including psychotropics.   CONDITION ON DISCHARGE:  The patient was discharged in improved  condition  after medication education was again given.   DISCHARGE MEDICATIONS:  1.  Seroquel 300 mg q.h.s. and 50 mg, 1-2 tabs twice a day as needed for      agitated thoughts.  The patient was provided with samples.  2.  Wellbutrin XL 150 mg daily.  3.  Nexium 40 mg as previously prescribed.  4.  Methadone to resume taking as previously prescribed by family doctor.  5.  Dulcolax 5 mg, take 2 at bedtime.  6.  Resume vitamin C 500 mg daily.  7.  Resume vitamin E 400 mg IU daily.  8.  Resume Fosamax as previously prescribed 70, next due Sunday, May 7,      20 06.  9.  Lexapro 10 mg, 2 q.a.m.   FOLLOW UP:  The patient was to follow up with Roney Marion on Thursday,  Mar 05, 2005 at 8:30 and [facility].  The patient must call  prior to follow-up and to continue follow-up regarding psychotropics.   DISCHARGE DIAGNOSES:   AXIS I:  1.  Depressive disorder not otherwise specified.  2.  Chronic pain.   AXIS II:  Deferred.   AXIS III:  Unexplained edema.   AXIS IV:  Severe (multiple medical problems, unclear etiology).   AXIS V:  Global Assessment of Functioning on discharge 60.       JEM/MEDQ  D:  04/05/2005  T:  04/06/2005  Job:  [id number]

## 2011-03-20 NOTE — Assessment & Plan Note (Signed)
DATE OF VISIT:  December 30, 2005.   HISTORY OF PRESENT ILLNESS:  Allison Lee is a 55 year old white female, who  is being seen in [facility] for chronic neck pain, lumbago,  and left leg pain.  She is back in today, reports her pain is averaging  about a 9 on a scale of 10, she gets a little relief with the current meds.  The pain is typically aggravated with activity, improved with rest and  medication.  The pain is described as intermittent and variable in its  nature, sometimes sharp, burning, stabbing, tingling, or aching.  Sleep is  overall poor.  She is walking about five minutes at a time.  She is able to  climb stairs, and she is still able to drive.  She requires assistance with  dressing, bathing, meal prep, household duties, and shopping.  Admits to  quite a bit of anxiety.  She is helping care for various family members,  which have been ill lately, and is somewhat stressed about this.  Denies  suicidal ideation at this time.   REVIEW OF SYSTEMS:  Noncontributory at this point.  Currently denies  suicidal ideation.   Past medical, social, family history unchanged since our last visit.  Smokes  a half a pack of cigarettes a day, continues to live with her husband.   PHYSICAL EXAMINATION:  VITAL SIGNS:  Blood pressure 112/56, pulse 88,  respirations 16, 99% saturated on room air.  GENERAL:  A well-developed, well-nourished woman, who appears her stated  age.  She is oriented x3.  Her affect is bright, alert.  She is cooperative.  She is pleasant.  Does not seem particularly depressed today but does seem  somewhat anxious regarding her family members, which have been ill.  PAIN AND REHAB EVALUATION:  She is able to stand in the room.  Her gait is  nonantalgic.  She has a normal base of support.  Heel-toe mechanics appear  within normal limits as well.  She has limitations in cervical range of  motion in all planes.  Full shoulder range of motion is noted,  however.  Reflexes are 2+ throughout upper and lower extremities.  No abnormal tone is  noted.  Motor strength is good throughout both upper and lower extremities.   IMPRESSION:  1.  Lumbago.  2.  Cervicalgia, status post cervical fusion, C5 through C6.  3.  Lumbar spondylosis.  4.  Depression.  5.  Anxiety.   PLAN:  The patient is currently on Klonopin for her depression from an  outside clinic.  We will go ahead and refill her MS-Contin today 15 mg one  p.o. q.8h, #90, as well as we will increase her Lyrica from 50 mg q.h.s. to  50 mg t.i.d. with two refills.  We will see her back in a month.           ______________________________  Brantley Stage, M.D.     DMK/MedQ  D:  12/30/2005 14:57:35  T:  12/30/2005 22:24:51  Job #:  [id number]
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
## 2011-03-20 NOTE — Assessment & Plan Note (Signed)
Allison Lee is a 55 year old married female who is being seen in our pain and  rehabilitative clinic for chronic neck pain and more recently she has had  complaints of low back pain and bilateral leg pain and decreased ability to  walk more than five to 10 minutes.   She reports her average pain is about a 9 on a scale of 10.  Her sleep has  been poor.  She describes the pain as sharp, burning, stabbing in the legs.  When she has been up for awhile or walking, she gets a shooting pain.  This  happens throughout the day.  The shooting pain is rather brief, but she does  have a continued aching and burning pain in the legs, especially when up.   She does drive.  She is able to climb stairs.  She is able to take care of  her self-care needs as well as some light housekeeping duties as well.   Denies suicidal ideation.  Denies bowel or bladder control problems.  Does  admit to some lower extremity weakness, especially when up walking.  Occasional spasms and dizziness.  Admits to depression and anxiety and is  followed by a psychiatrist, which she has recently switched, and is now  going to be followed by Dr. Westley Chandler.   REVIEW OF SYSTEMS:  Positive for recently some nausea and vomiting, fevers  and chills.  She feels that she may have had a mild gastrointestinal virus.   She has had no problems with the morphine, however.  No problems with  oversedation.  She continues to take it appropriately.   PAST MEDICAL HISTORY:  Unchanged since last visit.   SOCIAL HISTORY:  Unchanged since last visit.  She continues to smoke half a  pack of cigarettes a day.   No change in family history as well.   PHYSICAL EXAMINATION:  VITAL SIGNS:  Blood pressure 108/71, pulse 88,  respirations 16, 98% saturated on room air.  GENERAL:  She is a well-developed, well-nourished female who appears her  stated age.  She is oriented x3.  Affect is bright, alert.  She is  cooperative and pleasant today.  MUSCULOSKELETAL/NEUROLOGIC:  She is able to stand after being seated.  She  moves somewhat slow.  She has a nonantalgic gait.  She is able to walk on  heels and toes.  Balance is good.  Seated reflexes are 2+ at the knees, 1+  at the ankles.  Straight leg raise is negative.  She has some numbness along  the medial border of the left foot and part way into the plantar surface of  the left foot.  She also has some right EHL weakness compared to the left,  4+/5, compared to about 5/5 on the left.  The rest of her motor strength is  excellent throughout both lower extremities.  No focal weakness is noted.  She does have limited range of motion in both cervical and lumbar spine,  which is unchanged from the previous visit.   IMPRESSION:  1.  Low back pain, bilateral lower extremity pain, worse with walking,      standing, consistent with symptoms of lumbar stenosis.  2.  Cervicalgia.  3.  Status post cervical fusion at C5-6.  4.  Depression, currently being followed by Dr. Westley Chandler.   PLAN:  She brings in her medications today.  She has 56 pills left.  She has  been appropriate with her medications.  No problems with oversedation.  No  problems with persistent constipation.   PLAN:  Her last MRI was in March 2005.  She since then has had a gradual  worsening of her ability to stand and walk for any length of time, which is  consistent with __________-like symptoms.  Would like to check her MRI, and  we will discuss treatment options after MRI is obtained.  We will see her  back then in a month and review this with her.           ______________________________  Brantley Stage, M.D.     DMK/MedQ  D:  09/02/2005 13:38:43  T:  09/03/2005 06:19:12  Job #:  [id number]

## 2011-03-20 NOTE — Assessment & Plan Note (Signed)
MEDICAL RECORD NUMBER:  [id number].   Allison Lee is a 55 year old white female who is accompanied by her husband  today. She is back in for a refill of her medications. Continues to have  cervicalgia; however, her worst pain today is her low back which is  bothering her quite a bit and also complains of some bilateral lower  extremity edema. Her average pain in the low back today is about on a 8 on a  scale of 10. Sleep has been poor. Pain is worse with walking, bending,  sitting, standing. Pain improves with rest and medication. The methadone is  providing a little relief for her. She is able to walk about ______________  minutes at time. She is able to climb stairs. She is able to drive. She does  not use any assistive device to walk. She has been disabled since October of  2004, lasted worked in January 30, 2002. She admits to some suicidal thoughts.  At this point, would not act on it. She does follow closely with her  psychiatrist. She does admit to depression and anxiety as well. She has  reported weight gain due to fluids and limb swelling. I asked her to follow  up with her family doctor/primary care physician for the leg swelling.   There has been no changes in past medical, social or family history since  last visit.   PHYSICAL EXAMINATION:  Blood pressure is 137/97, pulse 103, respirations 20,  100% saturated on room air. She is a well-developed, well-nourished woman.  She does have obvious lower extremity edema today. She is oriented x3. Her  affect is depressed, but she is cooperative and pleasant. Limitations in  cervical and lumbar range of motion. Motor strength is quite good  throughout.   IMPRESSION:  1.  Cervicalgia.  2.  Lumbago.   PLAN:  We will refill methadone 5 mg 2 p.o. q.a.m., 1 p.o. at noon, 1 p.o.  q.p.m., and 1 p.o. q.h.s. At the last visit, she did have some right flank  pain. Has a history of kidney stones. With her edema, I would like her to  followup  again with her family doctor. This reiterated to her and her  husband today. She is on possibly some medications that may cause some fluid  retention. We also discussed maybe switching her from methadone to MS  Contin. We will need to check out the prices for her to see if she can  afford this.      DMK/MedQ  D:  01/28/2005 17:31:57  T:  01/28/2005 20:30:50  Job #:  [id number]

## 2011-04-01 ENCOUNTER — Encounter
Payer: Medicare Other | Attending: Physical Medicine and Rehabilitation | Admitting: Physical Medicine and Rehabilitation

## 2011-04-01 DIAGNOSIS — Z981 Arthrodesis status: Secondary | ICD-10-CM | POA: Insufficient documentation

## 2011-04-01 DIAGNOSIS — M545 Low back pain, unspecified: Secondary | ICD-10-CM | POA: Insufficient documentation

## 2011-04-01 DIAGNOSIS — M542 Cervicalgia: Secondary | ICD-10-CM

## 2011-04-01 DIAGNOSIS — R209 Unspecified disturbances of skin sensation: Secondary | ICD-10-CM

## 2011-04-01 DIAGNOSIS — Z79899 Other long term (current) drug therapy: Secondary | ICD-10-CM | POA: Insufficient documentation

## 2011-04-01 DIAGNOSIS — M5137 Other intervertebral disc degeneration, lumbosacral region: Secondary | ICD-10-CM | POA: Insufficient documentation

## 2011-04-01 DIAGNOSIS — M79609 Pain in unspecified limb: Secondary | ICD-10-CM

## 2011-04-01 DIAGNOSIS — M129 Arthropathy, unspecified: Secondary | ICD-10-CM | POA: Insufficient documentation

## 2011-04-01 DIAGNOSIS — M51379 Other intervertebral disc degeneration, lumbosacral region without mention of lumbar back pain or lower extremity pain: Secondary | ICD-10-CM | POA: Insufficient documentation

## 2011-04-01 NOTE — Assessment & Plan Note (Signed)
[name] is a pleasant 55 year old married woman who was last seen by me on December 05, 2010.  She is back in today for a brief recheck and refill of her pain medications.  She has requested to be weaned down off her morphine.  She is completely off her morphine at this point, she had been on 30 mg twice a day.  Her last dose of morphine was about a month ago.  She does not find the Ultracet at all helpful, and she has stopped taking that for the last several months as well.  She finds the oxycodone to be helpful but since she is off morphine, she is requesting an extra dosage of this.  Her pain continues to be predominately in her neck radiating to both upper extremities as well as in her low back radiating down predominantly the left lower extremity. Her average pain is about 10 on a scale of 10.  She describes the pain as constant, improves somewhat with rest and medications.  FUNCTIONAL STATUS:  She can walk 5 minutes.  She is able to climb stairs and drive.  She is independent with self-care.  She denies any problems controlling bowel or bladder.  She admits to some weakness especially of upper extremity, numbness, and tingling.  Otherwise, review of systems negative.  She did have a fall a couple of weeks ago and is seeing Dr. Otelia Lee for this.  She injured her left foot. She is declining any radiographs at this time.  Past medical, social, family history otherwise unchanged.  Continues to smoke a pack of cigarettes a day.  She had been on medications, at this time include oxycodone, she was taking 5 mg three times a day.  Now that she is completely off her MS Contin, she is taking two 5 mg tablets three times a day and finds it to be inadequate to help manage her pain at this point.  On exam today, her blood pressure is 124/79, pulse 106, respirations 18, 99% saturating on room air.  She is a well-developed, well-nourished woman who does not appear in any distress.   She is oriented x3.  Speech is clear.  Affect is bright.  She is alert, cooperative, and pleasant. Follows commands without difficulty and answers my questions appropriately.  Cranial nerves and coordination are intact.  Reflexes are brisk in the upper extremities with a negative Hoffmann sign.  Her lower extremity reflexes are 1+ at the patellar and Achilles tendon.  No clonus is noted.  Motor strength is generally in the 5/5 range in the upper extremities with the exception of left triceps is a grade weaker than the right triceps at this point.  She reports numbness throughout the left upper extremity as well as left lower extremity.  She transitions from sitting to standing without difficulty.  Her gait is slow.  Tandem gait and Romberg test are performed adequately.  IMPRESSION: 1. Cervicalgia with pain radiating to bilateral upper extremities with     weak left triceps and numbness in the left upper extremity. 2. Lumbago with a long history of lumbar degenerative disk disease,     facet arthropathy.  She has a history of C5-6 arthrodesis, June 29, 2002, Dr. Danielle Lee.  She has been, however, following up with Dr. Channing Lee in the past, neurosurgeon, and he has suggested surgery of her low back in the past; however, she has declined any invasive means of managing her back or neck pain at this point.  PLAN:  We will go ahead and get a cervical MRI to evaluate her neck further.  She is in agreement with this.  I will refill her pain medication, oxycodone 10 mg one p.o. q.i.d.  She is currently off all of her morphine at this time.  She is off Ultracet at this time.  She does take p.r.n. Flexeril 5 mg two to three times a day.  She is comfortable with our plan.  I will see her back after the scan.     Allison Lee, M.D. Electronically Signed    DMK/MedQ D:  01/30/2011 14:03:32  T:  01/31/2011 05:37:00  Job #:  [id number]

## 2011-04-02 NOTE — Assessment & Plan Note (Signed)
[name] is a 55 year old married woman who is [facility].  She has chronic pain complaints of neck and low back pain, left upper extremity pain, and left lower extremity pain.  She has a history of C5-6 arthrodesis, Dr. Danielle Dess, June 29, 2002.  She is requested to follow up with Dr. Channing Mutters for further evaluation of her neck and arm pain.  She recently underwent cervical MRI and the results are attached to the chart and were reviewed with her.  Her average pain is about an 8 on a scale of 10.  Sleep is poor.  Pain is worse with activities, tends to improve with rest and medication. She reports a little relief with current meds.  Medications prescribed through center for pain include oxycodone 10 mg q.4-6 h. not more than 5 tablets per day.  FUNCTIONAL STATUS:  She is independent with self-care.  She denies problems controlling bowel or bladder.  She admits to depression, anxiety, but denies suicidal ideation.  She does have a psychiatrist, Dr. Wynonia Lawman and Saul Fordyce.  REVIEW OF SYSTEMS:  Noncontributory.  PAST MEDICAL/SOCIAL/FAMILY HISTORY:  Unchanged from previous visit.  Medications prescribed through center for pain include Flexeril 5 mg not more than 3 times per day and oxycodone 10 mg not more than 5 tablets per day.  PHYSICAL EXAMINATION:  VITAL SIGNS:  Blood pressure is 112/69, pulse 85, respiration 18, 98% saturated on room air. GENERAL:  She is well-developed thin woman who does not appear in any distress. NEUROLOGIC:  She is oriented x3.  Speech is clear.  Affect is bright. She is alert, cooperative, and pleasant.  Follows commands with difficulty and answers my questions appropriately.  Cranial nerves and coordination are intact.  Reflexes are 2+ in the upper extremities. Negative Hoffmann sign and no abnormal tone or clonus is noted in the lower extremities.  Reflexes in lower extremity are 1+ in the  patellar and Achilles tendon on the right, 0 at the Achilles tendon on the left. Her motor strength is 5/5 in upper extremities.  She has difficulty giving full effort, however, especially with triceps, she may be half a grade weaker in the right and left triceps.  She has negative Hoffman's sign.  She has limitations in cervical range of motion and limitations in lumbar motion.  Her gait is slow and nonantalgic.  She is able to perform tandem gait without much problem and is able to perform Romberg test adequately as well.  IMPRESSION: 1. Cervicalgia with pain radiating to both upper extremities.     Strength seems somewhat improved from previous visit in the upper     extremities, although she does have trouble giving good effort due     to her pain. 2. Lumbago with long history of lumbar degenerative disk disease/facet     arthropathy.  PLAN:  She is requesting a long-acting medication.  She had been on MS Contin in the past.  She has tried oxycodone.  She is currently taking it about 5 times per day at this point.  I will try her on OxyContin 10 mg 1 p.o. b.i.d. and oxycodone 10 mg 1 p.o. t.i.d.  The overall dosage is staying the same as she had last month except for giving her and a long-acting medication in addition to her short-acting.  This will be a trial for a month and we will determine whether she does well with this or possibly go back to her q.4-6  hour dosing schedule.  I have answered all her questions.  She is comfortable with our plan at this point.  I would like to consider electrodiagnostic studies in her upper extremities to rule out carpal tunnel problem as well.  We will have this set up.     Brantley Stage, M.D. Electronically Signed    DMK/MedQ D:  04/01/2011 14:11:39  T:  04/02/2011 04:07:17  Job #:  [id number]

## 2011-04-06 ENCOUNTER — Other Ambulatory Visit: Payer: Self-pay | Admitting: Endocrinology

## 2011-04-06 DIAGNOSIS — E049 Nontoxic goiter, unspecified: Secondary | ICD-10-CM

## 2011-04-10 ENCOUNTER — Ambulatory Visit
Admission: RE | Admit: 2011-04-10 | Discharge: 2011-04-10 | Disposition: A | Discharge status: Home / Self Care | Payer: Medicare Other | Source: Ambulatory Visit | Attending: Endocrinology | Admitting: Endocrinology

## 2011-04-10 DIAGNOSIS — E049 Nontoxic goiter, unspecified: Secondary | ICD-10-CM

## 2011-05-11 ENCOUNTER — Encounter
Payer: Medicare Other | Attending: Physical Medicine and Rehabilitation | Admitting: Physical Medicine and Rehabilitation

## 2011-05-11 DIAGNOSIS — G8929 Other chronic pain: Secondary | ICD-10-CM | POA: Insufficient documentation

## 2011-05-11 DIAGNOSIS — R209 Unspecified disturbances of skin sensation: Secondary | ICD-10-CM

## 2011-05-11 DIAGNOSIS — M545 Low back pain, unspecified: Secondary | ICD-10-CM | POA: Insufficient documentation

## 2011-05-11 DIAGNOSIS — M542 Cervicalgia: Secondary | ICD-10-CM | POA: Insufficient documentation

## 2011-05-11 DIAGNOSIS — R259 Unspecified abnormal involuntary movements: Secondary | ICD-10-CM | POA: Insufficient documentation

## 2011-06-01 ENCOUNTER — Encounter: Payer: Medicare Other | Attending: Neurosurgery | Admitting: Neurosurgery

## 2011-06-01 DIAGNOSIS — M545 Low back pain, unspecified: Secondary | ICD-10-CM | POA: Insufficient documentation

## 2011-06-01 DIAGNOSIS — G8929 Other chronic pain: Secondary | ICD-10-CM | POA: Insufficient documentation

## 2011-06-01 DIAGNOSIS — M542 Cervicalgia: Secondary | ICD-10-CM | POA: Insufficient documentation

## 2011-06-01 DIAGNOSIS — M549 Dorsalgia, unspecified: Secondary | ICD-10-CM | POA: Insufficient documentation

## 2011-06-02 NOTE — Assessment & Plan Note (Signed)
ACCOUNT:  [id number].  HISTORY:  Ms. Volkov is a patient of Dr. Pamelia Hoit, seen for her upper and lower back pain.  Her cervicalgia has gone to the point that she get a second surgical opinion with Dr. Sharolyn Douglas. She had seen Dr. Trey Sailors prior.  Dr. Noel Gerold has evidently told her that he is going to do a C4-5, C5-6, C6-7 anterior cervical diskectomy and fusion as he is going to plan that for September.  Her Oswestry score today is 62.  Her pain level is 8 or 9, it is sharp burning pain, it is constant with paresthesias.  Her activity level is 8.  Pain is same 24 hours a day. Sleep patterns are poor.  All activities aggravate.  Rest and medication tend to help.  She uses a cane for ambulation at times.  She drives and climb steps and can walk about 5 minutes at a time, she transfers alone. She had been on disability since 2005.  REVIEW OF SYSTEMS:  Notable for difficulties described above as well as some limb swelling, depression, confusion, anxiety.  No suicidal thoughts or aberrant behaviors.  She does have spasms and paresthesias in the upper and lower extremities.  PAST MEDICAL HISTORY:  Unchanged.  SOCIAL HISTORY:  Married.  Lives with her husband and family.  FAMILY HISTORY:  Unchanged.  PHYSICAL EXAMINATION:  Blood pressure is 99/70, pulse 82, respirations 18, O2 sat is 100% on room air.  Constitutionally, she is very thin. She is alert and oriented x3.  She does have a cane with her today.  Her sensation is diminished in the left lower extremity.  Strength is about 3/5 with deltoid, biceps, triceps, intrinsics.  On the right, she is a little better, 4/5.  She gives way to pain.  ASSESSMENT: 1. Cervicalgia. 2. Chronic pain with lumbago.  PLAN: 1. Refill oxycodone 10 mg one p.o. t.i.d., #90 with no refill. 2. OxyContin 10 mg one p.o. b.i.d., 60 with no refill.  She asked     about going up on this.  I will encouraged her not to try that     until she has her surgery because  pain will be harder to control. 3. She will follow up with Dr. Pamelia Hoit in 1 month. She already has     an appointment scheduled.     Kharlie Bring L. Blima Dessert Electronically Signed    RLW/MedQ D:  06/01/2011 14:24:18  T:  06/02/2011 00:32:04  Job #:  [id number]

## 2011-06-29 ENCOUNTER — Encounter
Payer: Medicare Other | Attending: Physical Medicine and Rehabilitation | Admitting: Physical Medicine and Rehabilitation

## 2011-06-29 DIAGNOSIS — M545 Low back pain, unspecified: Secondary | ICD-10-CM | POA: Insufficient documentation

## 2011-06-29 DIAGNOSIS — Z981 Arthrodesis status: Secondary | ICD-10-CM | POA: Insufficient documentation

## 2011-06-29 DIAGNOSIS — M79609 Pain in unspecified limb: Secondary | ICD-10-CM

## 2011-06-29 DIAGNOSIS — M542 Cervicalgia: Secondary | ICD-10-CM | POA: Insufficient documentation

## 2011-06-29 DIAGNOSIS — M51379 Other intervertebral disc degeneration, lumbosacral region without mention of lumbar back pain or lower extremity pain: Secondary | ICD-10-CM | POA: Insufficient documentation

## 2011-06-29 DIAGNOSIS — G894 Chronic pain syndrome: Secondary | ICD-10-CM

## 2011-06-29 DIAGNOSIS — M5137 Other intervertebral disc degeneration, lumbosacral region: Secondary | ICD-10-CM | POA: Insufficient documentation

## 2011-06-29 NOTE — Assessment & Plan Note (Signed)
[name] is a pleasant 55 year old married woman who is followed here at [facility].  She has multiple chronic pain complaints including cervicalgia, lumbago and upper and lower extremity pain.  She has a history of C5-6 arthrodesis per Dr. Danielle Dess on June 29, 2002.  She had been following up with Dr. Channing Mutters but recently followed up with Dr. Noel Gerold and was planning a cervical fusion, however, she tells me that she has been rescheduled to see Dr. Alveda Reasons for this fusion.  Her average pain is about 8 on a scale of 10.  She is here for refill of her pain medications.  There is no changes in the quality of her pain. She continues to have cervicalgia especially with left upper extremity pain.  Functional status.  She can walk 3-5 minutes at a time.  She is able to climb stairs.  She is driving.  She is independent with feeding, bathing, toileting and sometimes needs assistance with dressing, meal prep.  Denies problems controlling bowel or bladder.  Admits to depression and anxiety.  Denies suicidal ideation.  Past medical, social, family history otherwise unchanged.  She maintains contact with her primary care doctor as well as Dr. Hal Hope and Dr. Saul Fordyce.  No other changes in social or family history.  Exam today; blood pressure is 107/70, pulse 88, respirations 16, 98% saturated on room air.  She is a well-developed thin adult female who does not appear in any distress.  She is oriented x3.  Speech is clear. Affect is bright.  She is alert, cooperative and pleasant.  Follows commands without difficulty.  Answers my questions appropriately. Cranial nerves and coordination are intact.  Her reflexes are diminished in the upper and lower extremities.  No abnormal tone clonus, tremors noted.  She has 5/5 strength in the upper extremities except for right triceps 4/5 and she has 4/5 dorsiflexion in the lower extremities. Otherwise 5/5 strength is  noted.  She transitions from sitting to standing without difficulty.  Gait is slow.  Tandem gait and Romberg test are performed adequately.  She has limitations in cervical motion in all planes as well as lumbar motion in all planes.  IMPRESSION: 1. Cervicalgia with pain rating both upper extremities.     Neurologically appears to be stable. 2. Lumbago with a long history of lumbar degenerative disk     disease/facet arthropathy.  PLAN:  She is following back up with Dr. Alveda Reasons for a possible C4-5, C5-6 and C6-7 fusion.  She is requesting a refill of her pain medications. Her pill counts are appropriate.  She has been taking her medications as prescribed.  No evidence of aberrant behavior.  I will refill her oxycodone 10 mg 1 p.o. t.i.d. p.r.n. neck pain #90 and OxyContin 10 mg 1 p.o. q.12 h.  She will also need a refill on her Flexeril.  She uses that on a p.r.n. basis as well.  I will have her follow up with me in 2 months.  She will see the nurse practitioner next month for refill of her medications as she sorts out who is going to be doing her cervical spine surgery.  I have spent some time reviewing medications scheduling with her and how to take her medications in a more efficacious schedule. She tells me she has also had Remeron recently added.  This is noted in her med chart.  I have answered all her questions.  She is comfortable with the medications she is  taking.  No problems with the side effects or oversedation or constipation and she reports some relief with the use of these meds.    Brantley Stage, M.D. Electronically Signed   DMK/MedQ D:  06/29/2011 14:50:37  T:  06/29/2011 22:28:50  Job #:  [id number]

## 2011-07-02 ENCOUNTER — Other Ambulatory Visit: Payer: Self-pay | Admitting: Orthopedic Surgery

## 2011-07-02 DIAGNOSIS — M542 Cervicalgia: Secondary | ICD-10-CM

## 2011-07-07 ENCOUNTER — Ambulatory Visit
Admission: RE | Admit: 2011-07-07 | Discharge: 2011-07-07 | Disposition: A | Discharge status: Home / Self Care | Payer: Medicare Other | Source: Ambulatory Visit | Attending: Orthopedic Surgery | Admitting: Orthopedic Surgery

## 2011-07-07 DIAGNOSIS — M542 Cervicalgia: Secondary | ICD-10-CM

## 2011-07-28 ENCOUNTER — Encounter: Payer: Medicare Other | Attending: Neurosurgery | Admitting: Neurosurgery

## 2011-07-28 DIAGNOSIS — M545 Low back pain, unspecified: Secondary | ICD-10-CM

## 2011-07-28 DIAGNOSIS — R209 Unspecified disturbances of skin sensation: Secondary | ICD-10-CM | POA: Insufficient documentation

## 2011-07-28 DIAGNOSIS — M7989 Other specified soft tissue disorders: Secondary | ICD-10-CM | POA: Insufficient documentation

## 2011-07-28 DIAGNOSIS — M542 Cervicalgia: Secondary | ICD-10-CM | POA: Insufficient documentation

## 2011-07-28 DIAGNOSIS — M62838 Other muscle spasm: Secondary | ICD-10-CM | POA: Insufficient documentation

## 2011-07-28 NOTE — Assessment & Plan Note (Signed)
This is a patient of Dr. Leretha Dykes who is seen here for chronic back and neck pain.  She states she is pending surgery with Dr. Alveda Reasons, possibly she was scheduled with Dr. Noel Gerold and he is currently out of the office.  She states that she has seen Dr. Alveda Reasons when he has requested more records to review with her and she possibly has a 2 or 3 level ACDF pending.  She rates her pain at an 8.  It is a sharp burning, stabbing, tingling, aching, constant-type pain.  General activity level is 8/9. The pain is same 24 hours a day.  Sleep patterns are poor.  All activities aggravate her pain.  Rest and medication tend to help.  She does use a cane for ambulation.  She can climb steps and drive.  She can walk about 5 minutes at a time.  She is on disability and needs help with household duties and shopping.  REVIEW OF SYSTEMS:  Notable for difficulties described above as well as some limb swelling, spasms, trouble walking, numbness, tingling, anxiety, and depression.  No suicidal thoughts or aberrant behaviors. Pill counts were correct.  Last UDS was in April.  We will have that scheduled for next appointment.  PAST MEDICAL HISTORY, SOCIAL HISTORY, AND FAMILY HISTORY:  Unchanged.  PHYSICAL EXAMINATION:  Her blood pressure is 124/86, pulse 77, respirations 16, and O2 sats 97 on room air.  Her motor strength is about 4/5 in the upper and lower extremities.  She has a lot of pain. Her sensation appears to be intact.  Constitutionally, she is thin.  She is alert and oriented x3.  She has a slight limp to her gait.  IMPRESSION: 1. Cervicalgia. 2. Lumbago.  PLAN: 1. She will follow up with Dr. Alveda Reasons regarding any possible surgeries. 2. Oxycodone 10 mg 1 p.o. t.i.d., #90 with no refill. 3. Oxycodone 10 mg 1 p.o. b.i.d., #60 with no refill. 4. She will follow up with Dr. Pamelia Hoit in 1 month.  Her questions     were encouraged and answered.     Shaliah Wann L. Blima Dessert Electronically  Signed    RLW/MedQ D:  07/28/2011 10:53:24  T:  07/28/2011 11:29:39  Job #:  [id number]

## 2011-08-06 LAB — URINALYSIS, ROUTINE W REFLEX MICROSCOPIC
Nitrite: NEGATIVE
Protein, ur: NEGATIVE mg/dL
Specific Gravity, Urine: 1.009 (ref 1.005–1.030)
Urobilinogen, UA: 0.2 mg/dL (ref 0.0–1.0)

## 2011-08-20 LAB — I-STAT 8, (EC8 V) (CONVERTED LAB)
Acid-Base Excess: 1
Chloride: 104
Glucose, Bld: 82
TCO2: 28
pCO2, Ven: 44.1 — ABNORMAL LOW
pH, Ven: 7.384 — ABNORMAL HIGH

## 2011-08-20 LAB — POCT I-STAT CREATININE
Creatinine, Ser: 0.7
Operator id: 235561

## 2011-08-26 ENCOUNTER — Encounter
Payer: Medicare Other | Attending: Physical Medicine and Rehabilitation | Admitting: Physical Medicine and Rehabilitation

## 2011-08-26 DIAGNOSIS — M47812 Spondylosis without myelopathy or radiculopathy, cervical region: Secondary | ICD-10-CM | POA: Insufficient documentation

## 2011-08-26 DIAGNOSIS — M51379 Other intervertebral disc degeneration, lumbosacral region without mention of lumbar back pain or lower extremity pain: Secondary | ICD-10-CM | POA: Insufficient documentation

## 2011-08-26 DIAGNOSIS — M79609 Pain in unspecified limb: Secondary | ICD-10-CM | POA: Insufficient documentation

## 2011-08-26 DIAGNOSIS — M545 Low back pain, unspecified: Secondary | ICD-10-CM

## 2011-08-26 DIAGNOSIS — J4489 Other specified chronic obstructive pulmonary disease: Secondary | ICD-10-CM | POA: Insufficient documentation

## 2011-08-26 DIAGNOSIS — G894 Chronic pain syndrome: Secondary | ICD-10-CM

## 2011-08-26 DIAGNOSIS — G8929 Other chronic pain: Secondary | ICD-10-CM | POA: Insufficient documentation

## 2011-08-26 DIAGNOSIS — M129 Arthropathy, unspecified: Secondary | ICD-10-CM | POA: Insufficient documentation

## 2011-08-26 DIAGNOSIS — M542 Cervicalgia: Secondary | ICD-10-CM

## 2011-08-26 DIAGNOSIS — J449 Chronic obstructive pulmonary disease, unspecified: Secondary | ICD-10-CM | POA: Insufficient documentation

## 2011-08-26 DIAGNOSIS — M5137 Other intervertebral disc degeneration, lumbosacral region: Secondary | ICD-10-CM | POA: Insufficient documentation

## 2011-08-26 DIAGNOSIS — Z79899 Other long term (current) drug therapy: Secondary | ICD-10-CM | POA: Insufficient documentation

## 2011-08-26 NOTE — Assessment & Plan Note (Signed)
[name] is a very pleasant 55 year old woman who is followed here at our [facility] for chronic pain complaints related to her neck, upper extremities, low back, and lower extremities.  She has known cervical spondylosis.  She has been following up with Dr. Alveda Reasons regarding a treatment plan regarding her neck.  Apparently she does not believe that surgery will be a viable option for her.  She tells me she is recently diagnosed with COPD and is currently on inhalers.  She also notes that she had a recent fall over a week ago while she was out walking dog.  The dog tangled the leash in her legs and she fell into a pole and was bruised up in her right upper extremity somewhat.  She reports she is overall feeling better from this.  She tells me she is not interested in further workup at this time for her injury.  Average pain is about 8 on a scale of 10.  Sleep tends to be poor.  Pain is rather constant, worse with activities, but improved somewhat with rest and medication.  She reports fair relief with current medications.  Medications which are prescribed through [facility] include p.r.n. Flexeril 5 mg, OxyContin 10 mg twice a day, and oxycodone 10 mg 3 times a day.  FUNCTIONAL STATUS:  She can walk about 5 minutes at a time.  She can climb stairs.  She does not drive.  She is independent with self-care.  Denies problems controlling bowel or bladder.  Admits to depression, anxiety.  Denies suicidal ideation.  PAST MEDICAL/SOCIAL/FAMILY HISTORY:  Otherwise unchanged.  PHYSICAL EXAMINATION:  VITAL SIGNS:  Blood pressure is 107/72, pulse 88, respiration 18, 100% saturated on room air. GENERAL:  She is a thin woman who appears her stated age and does not appear in any distress.  NEUROLOGIC:  She is oriented times 3.  Speech is clear.  Affect is bright.  She is alert, cooperative, and pleasant. Follows commands without difficulty.  Answers  my questions appropriately.  Cranial nerves, coordination are intact.  Her reflexes are brisk in the upper extremities.  Lower extremities are 2+ at the patellar tendons, diminished at the Achilles tendon.  No clonus is appreciated.  She has some reflex overflow when brachioradialis and biceps are tapped, however.  Motor strength is overall good without obvious focal deficit.  She transitions from sitting to standing without difficulty.  Gait is slow. Tandem gait and Romberg test are performed adequately.  She has significant limitations in cervical as well as lumbar motion.  IMPRESSION: 1. Cervicalgia with known cervical spondylosis. 2. Lumbago with long history of lumbar degenerative disk disease/facet     arthropathy.  PLAN:  I refilled her pain medication, OxyContin 10 mg 1 p.o. b.i.d., #60, oxycodone 10 mg 1 p.o. t.i.d.,  # 90.  Her pill counts were slightly off today.  Apparently she was over at [facility] after she had her fall and is told to increase her dose slightly to 4 times a day over 15 days.  She understands she has a pain contract with [place] and they will need to okay increasing doses with our clinic.  Her last urine drug screen was consistent.  She typically takes her medications as prescribed without evidence of aberrant behavior.  We will continue to follow her and monitor her use of her narcotic pain medications.  She will be following up with Dr. Alveda Reasons with consideration for a cervical fusion. She  has really not been interested in any kind of epidural steroids, physical therapy.  I have answered all her questions.  She is comfortable with the current plan.     Brantley Stage, M.D. Electronically Signed    DMK/MedQ D:  08/26/2011 14:20:15  T:  08/26/2011 23:22:54  Job #:  [id number]

## 2011-09-22 ENCOUNTER — Encounter: Payer: Medicare Other | Attending: Neurosurgery | Admitting: Neurosurgery

## 2011-09-22 DIAGNOSIS — M545 Low back pain, unspecified: Secondary | ICD-10-CM | POA: Insufficient documentation

## 2011-09-22 DIAGNOSIS — G8929 Other chronic pain: Secondary | ICD-10-CM | POA: Insufficient documentation

## 2011-09-22 DIAGNOSIS — F329 Major depressive disorder, single episode, unspecified: Secondary | ICD-10-CM | POA: Insufficient documentation

## 2011-09-22 DIAGNOSIS — M62838 Other muscle spasm: Secondary | ICD-10-CM | POA: Insufficient documentation

## 2011-09-22 DIAGNOSIS — F3289 Other specified depressive episodes: Secondary | ICD-10-CM | POA: Insufficient documentation

## 2011-09-22 DIAGNOSIS — M549 Dorsalgia, unspecified: Secondary | ICD-10-CM | POA: Insufficient documentation

## 2011-09-22 DIAGNOSIS — R209 Unspecified disturbances of skin sensation: Secondary | ICD-10-CM | POA: Insufficient documentation

## 2011-09-22 DIAGNOSIS — M25519 Pain in unspecified shoulder: Secondary | ICD-10-CM | POA: Insufficient documentation

## 2011-09-22 DIAGNOSIS — M542 Cervicalgia: Secondary | ICD-10-CM | POA: Insufficient documentation

## 2011-09-22 DIAGNOSIS — R279 Unspecified lack of coordination: Secondary | ICD-10-CM | POA: Insufficient documentation

## 2011-09-22 DIAGNOSIS — F411 Generalized anxiety disorder: Secondary | ICD-10-CM | POA: Insufficient documentation

## 2011-09-22 NOTE — Assessment & Plan Note (Signed)
This is a patient of Dr. Leretha Dykes who is followed for chronic shoulder and back pain.  She reports no change in her pain.  She rates it an 8.  It is a sharp, burning, tingling, aching, constant pain. General activity level is 8-9.  The pain is same 24 hours a day.  Sleep patterns are poor.  Walking, bending, sitting, standing, and activity aggravate.  Rest and medication tend to help.  She walks with a single- point cane.  Sometimes she can climb steps.  She does drive.  She can walk about 5 minutes at a time.  She is unemployed and needs some help with household duties.  REVIEW OF SYSTEMS:  Notable for the difficulties described above as well as some paresthesias, trouble walking, spasms, depression, anxiety.  No suicidal thoughts or aberrant behaviors.  Last pill count and UDS consistent.  PAST MEDICAL HISTORY:  Unchanged.  SOCIAL HISTORY:  Unchanged.  FAMILY HISTORY:  Unchanged.  PHYSICAL EXAMINATION:  VITAL SIGNS:  Blood pressure is 104/66, pulse 103, respirations 18, O2 sats 98 on room air. NEUROLOGIC:  Motor strength and sensation are intact. CONSTITUTIONAL:  She is thin.  She is alert and oriented x3.  She does have a slight limp with single-point cane.  IMPRESSION: 1. Cervicalgia. 2. Lumbago, chronic pain.  PLAN: 1. Refill oxycodone 10 mg 1 p.o. t.i.d., 90 with no refill. 2. OxyContin 10 mg 1 p.o. b.i.d., 60 with no refills.  Her questions     were encouraged and answered.  We will see her in a month.     Gumaro Brightbill L. Blima Dessert Electronically Signed    RLW/MedQ D:  09/22/2011 12:37:58  T:  09/22/2011 20:38:05  Job #:  [id number]

## 2011-09-30 ENCOUNTER — Other Ambulatory Visit: Payer: Self-pay | Admitting: Family Medicine

## 2011-09-30 DIAGNOSIS — Z1231 Encounter for screening mammogram for malignant neoplasm of breast: Secondary | ICD-10-CM

## 2011-10-21 ENCOUNTER — Encounter
Payer: Medicare Other | Attending: Physical Medicine and Rehabilitation | Admitting: Physical Medicine and Rehabilitation

## 2011-10-21 ENCOUNTER — Ambulatory Visit
Admission: RE | Admit: 2011-10-21 | Discharge: 2011-10-21 | Disposition: A | Discharge status: Home / Self Care | Payer: Medicare Other | Source: Ambulatory Visit | Attending: Family Medicine | Admitting: Family Medicine

## 2011-10-21 DIAGNOSIS — M545 Low back pain, unspecified: Secondary | ICD-10-CM | POA: Insufficient documentation

## 2011-10-21 DIAGNOSIS — M542 Cervicalgia: Secondary | ICD-10-CM | POA: Insufficient documentation

## 2011-10-21 DIAGNOSIS — M47812 Spondylosis without myelopathy or radiculopathy, cervical region: Secondary | ICD-10-CM | POA: Insufficient documentation

## 2011-10-21 DIAGNOSIS — M5137 Other intervertebral disc degeneration, lumbosacral region: Secondary | ICD-10-CM | POA: Insufficient documentation

## 2011-10-21 DIAGNOSIS — Z1231 Encounter for screening mammogram for malignant neoplasm of breast: Secondary | ICD-10-CM

## 2011-10-21 DIAGNOSIS — M47817 Spondylosis without myelopathy or radiculopathy, lumbosacral region: Secondary | ICD-10-CM | POA: Insufficient documentation

## 2011-10-21 DIAGNOSIS — M51379 Other intervertebral disc degeneration, lumbosacral region without mention of lumbar back pain or lower extremity pain: Secondary | ICD-10-CM | POA: Insufficient documentation

## 2011-10-21 DIAGNOSIS — G894 Chronic pain syndrome: Secondary | ICD-10-CM

## 2011-10-22 NOTE — Assessment & Plan Note (Signed)
[name] is a 55 year old married woman who is followed here at the [facility] for chronic pain complaints related to cervical and lumbar spondylosis.  She is back in today, continues to report pain on an 8 on a scale of 10. Pain is constant, dull, stabbing, and tingling and aching in nature, interfering significantly with activity levels.  Sleep tends to be poor. Pain is worse with general activities, walking, bending, sitting, standing, improves with rest and medication.  She reports fair relief with current meds.  Medications prescribed through [facility] include oxycodone 10 mg 3 times a day as well as OxyContin 10 mg b.i.d.  FUNCTIONAL STATUS:  She can walk 10 minutes.  She is able to climb stairs and drive.  She is independent with self-care.  She is also helping take care of her 26 year old father.  REVIEW OF SYSTEMS:  Admits to depression, anxiety.  Denies suicidal ideation.  Denies problems with bowel or bladder.  Denies any new problems with balance or weakness.  Past medical, social, family history are remarkable for recent diagnosis of lung cancer in her father.  This has been very stressful for Allison Lee.  PHYSICAL EXAMINATION:  Her blood pressure is 121/76, pulse 93, respirations 14, 98% saturated on room air.  She is a well-developed, thin woman who appears her stated age, and does not appear in any distress.  She is oriented x3.  Speech is clear.  Affect is bright.  She is alert, cooperative, and pleasant.  Follows commands without difficulty.  Answers my questions appropriately.  Cranial nerves and coordination are grossly intact.  Her reflexes are 1+ in the upper extremities, 1+ in the lower extremities, diminished at the left ankle. No abnormal tone, clonus, or tremors are noted.  Motor strength is generally good in both upper and lower extremities.  She has limited shoulder range of motion bilaterally.  She has  limited neck range of motion especially with extension.  Lumbar motion is limited as well.  Transitioning from sitting to standing is done without difficulty.  Her gait is nonantalgic.  Her balance is good.  Tandem gait and Romberg tests are all performed adequately.  IMPRESSION: 1. Cervicalgia with known cervical spondylosis. 2. Lumbago with a long history of lumbar degenerative disk/facet     arthropathy.  PLAN:  At this time, Ms. Buesing has been following up with Dr. Alveda Reasons. She tells me that she may not have a good surgical option for her neck at this time.  She has a followup visit with Dr. Alveda Reasons; however, she does not made the appointment due to medical problems in the family.  I am going to adjust her pain medications slightly today.  She is interested in taking her OxyContin just once a day in the evening and we will give her 1 extra pill a day of her short-acting oxycodone 10 mg.  Her new prescription will be OxyContin 10 mg 1 p.o. at bedtime and oxycodone 10 mg 1 p.o. q.4-6 hours not more than 5 pills per day.  Her pill counts are appropriate.  She has 37 extra pills of her OxyContin today.  I will not write a prescription for this.  Her last urine drug screen was done on July 28, 2011, and was consistent. She takes her medications as prescribed.  She does not display any aberrant behaviors.  I will see her back in a month.     Brantley Stage, M.D. Electronically Signed  DMK/MedQ D:  10/21/2011 11:05:40  T:  10/22/2011 04:54:16  Job #:  [id number]

## 2011-10-23 ENCOUNTER — Ambulatory Visit: Payer: Self-pay | Admitting: Physical Medicine and Rehabilitation

## 2011-11-09 ENCOUNTER — Ambulatory Visit: Payer: Self-pay | Admitting: Physical Medicine and Rehabilitation

## 2011-11-20 ENCOUNTER — Encounter: Payer: Medicare Other | Attending: Neurosurgery | Admitting: Neurosurgery

## 2011-11-20 ENCOUNTER — Encounter: Payer: Medicare Other | Admitting: Physical Medicine and Rehabilitation

## 2011-11-20 DIAGNOSIS — M542 Cervicalgia: Secondary | ICD-10-CM | POA: Insufficient documentation

## 2011-11-20 DIAGNOSIS — M549 Dorsalgia, unspecified: Secondary | ICD-10-CM | POA: Insufficient documentation

## 2011-11-20 DIAGNOSIS — M545 Low back pain, unspecified: Secondary | ICD-10-CM | POA: Insufficient documentation

## 2011-11-20 DIAGNOSIS — M25519 Pain in unspecified shoulder: Secondary | ICD-10-CM | POA: Insufficient documentation

## 2011-11-21 NOTE — Assessment & Plan Note (Signed)
This is a patient of Dr. Leretha Dykes who is seen for back and shoulder pain.  She reports no change in her pain at 7 or 8.  It is a sharp, burning, tingling, aching pain.  General activity level is 8-10.  Pain is same 24 hours a day.  Sleep patterns are poor.  Walking, bending, sitting, standing, and activity aggravate.  Medication helps some.  She uses a cane for ambulation.  She does climb steps and drive. Functionally, she is on disability.  She needs help with household duties.  REVIEW OF SYSTEMS:  Notable for difficulties as described above as well as some weight loss, constipation, GI issues, paresthesias, spasms, confusion, depression, anxiety.  No suicidal thoughts or aberrant behaviors.  Last pill count and UDS consistent.  She did ask about discontinuing her OxyContin and just using the oxycodone more often.  I told her I could refill oxycodone, but I would not change her medicines at this time, but she is going to discontinue the OxyContin.  She will talk about changes in her meds with Dr. Pamelia Hoit when she follows up in 2-3 weeks.  PAST MEDICAL HISTORY/SOCIAL HISTORY/FAMILY HISTORY:  Unchanged.  PHYSICAL EXAMINATION:  VITAL SIGNS:  Blood pressure 119/70, pulse 88, respirations 18, and O2 sats 96 on room air. NEUROLOGIC:  Motor strength and sensation intact.  She is thin, alert and oriented x3, slightly unstable gait.  IMPRESSION: 1. Cervicalgia. 2. Lumbago.  PLAN:  Refill oxycodone 10 mg 1 p.o. q.4-6 hours p.r.n., #150 with no refill.  Her questions were encouraged and answered.  She will see Dr. Pamelia Hoit in 2-3 weeks.     Bradd Merlos L. Blima Dessert Electronically Signed    RLW/MedQ D:  11/20/2011 15:40:18  T:  11/21/2011 06:52:02  Job #:  [id number]

## 2011-12-11 ENCOUNTER — Encounter
Payer: Medicare Other | Attending: Physical Medicine and Rehabilitation | Admitting: Physical Medicine and Rehabilitation

## 2011-12-11 DIAGNOSIS — M542 Cervicalgia: Secondary | ICD-10-CM

## 2011-12-11 DIAGNOSIS — M545 Low back pain, unspecified: Secondary | ICD-10-CM

## 2011-12-11 DIAGNOSIS — M79609 Pain in unspecified limb: Secondary | ICD-10-CM | POA: Insufficient documentation

## 2011-12-11 DIAGNOSIS — M5137 Other intervertebral disc degeneration, lumbosacral region: Secondary | ICD-10-CM | POA: Insufficient documentation

## 2011-12-11 DIAGNOSIS — M129 Arthropathy, unspecified: Secondary | ICD-10-CM | POA: Insufficient documentation

## 2011-12-11 DIAGNOSIS — M47812 Spondylosis without myelopathy or radiculopathy, cervical region: Secondary | ICD-10-CM | POA: Insufficient documentation

## 2011-12-11 DIAGNOSIS — G894 Chronic pain syndrome: Secondary | ICD-10-CM

## 2011-12-11 DIAGNOSIS — G8929 Other chronic pain: Secondary | ICD-10-CM | POA: Insufficient documentation

## 2011-12-11 DIAGNOSIS — M51379 Other intervertebral disc degeneration, lumbosacral region without mention of lumbar back pain or lower extremity pain: Secondary | ICD-10-CM | POA: Insufficient documentation

## 2011-12-12 NOTE — Assessment & Plan Note (Signed)
HISTORY OF PRESENT ILLNESS:  [name] is a pleasant 56 year old woman who is followed here at the [facility] for chronic pain complaints related to her neck and low back, her left upper extremity, and her left lower extremity.  She has known cervical spondylosis and has been following up with Dr. Alveda Reasons regarding treatment plan for her neck.  At this time, she does not feel surgery is going to be a viable option for her and she requests to continue to be followed here at the [facility] using medical management.  She is tearful today.  Her father had been quite ill, apparently he is getting hospice care at this time.  Her average pain is about 7 on a scale of 10.  Her pain is rather constant.  She has been having to take care of her father, helping out a little more physically than she had been in the past, and she has noted increased pain in her low back in the last couple of weeks.  Her sleep is poor.  Pain is worse with activities, improves with rest and medication.  She gets fair relief with current meds.  FUNCTIONAL STATUS:  She is walking 2-3 minutes at a time.  She can climb stairs.  She is able to drive.  She is independent with self-care.  REVIEW OF SYSTEMS:  Negative for problems with bowel or bladder.  Admits to depression and anxiety.  Denies suicidal ideation.  Past medical, social, and family history are unchanged from previous month.  PHYSICAL EXAMINATION:  VITAL SIGNS:  Today, blood pressure is 134/74, pulse 106, respirations 16, 94% saturated on room air.  She is 66 inches tall and 114 pounds. GENERAL:  She is thin woman who appears her stated age.  Does not appear in any distress.  She is oriented x3. NEUROLOGIC:  Speech is clear.  Affect is alert.  She is cooperative and pleasant.  She is tearful today because of her father's medical condition.  She transitions from sitting to standing slowly.  Her gait is  uneven. She has limitations in lumbar motion in all planes.  Her reflexes are symmetric and intact in the upper and lower extremities at the knees.  They are diminished at the ankles.  No abnormal tone, clonus, or tremors.  Her motor strength is generally good in both upper and lower extremities without obvious focal deficit.  Hoffmann sign is negative.  No spinal tenderness with palpation throughout the thoracic and lumbar spine today.  IMPRESSION: 1. Cervicalgia with known cervical spondylosis. 2. Lumbago with a long history of lumbar degenerative disc and facet     arthropathy.  PLAN:  We will make a minor adjustment in her medication.  She finds oxycodone to be helpful.  We will put her on a schedule of 10 mg 2 tablets at 5 a.m., 1 at 10 a.m., 1 at 2 p.m., and 1-2 at bedtime, #150, 25-day supply, and I will see her back in 3 weeks.  I have answered all her questions.  She is comfortable with this plan.    Brantley Stage, M.D.   DMK/MedQ D:  12/11/2011 14:38:39  T:  12/12/2011 07:40:52  Job #:  [id number]

## 2011-12-17 ENCOUNTER — Encounter: Payer: Self-pay | Admitting: *Deleted

## 2011-12-17 DIAGNOSIS — K589 Irritable bowel syndrome without diarrhea: Secondary | ICD-10-CM | POA: Insufficient documentation

## 2011-12-17 DIAGNOSIS — M810 Age-related osteoporosis without current pathological fracture: Secondary | ICD-10-CM | POA: Insufficient documentation

## 2011-12-17 DIAGNOSIS — G8929 Other chronic pain: Secondary | ICD-10-CM | POA: Insufficient documentation

## 2011-12-17 DIAGNOSIS — J449 Chronic obstructive pulmonary disease, unspecified: Secondary | ICD-10-CM

## 2011-12-26 ENCOUNTER — Encounter: Payer: Self-pay | Admitting: Family Medicine

## 2011-12-28 ENCOUNTER — Ambulatory Visit: Payer: Self-pay | Admitting: Family Medicine

## 2012-01-04 ENCOUNTER — Ambulatory Visit (INDEPENDENT_AMBULATORY_CARE_PROVIDER_SITE_OTHER): Payer: Medicare Other | Admitting: Family Medicine

## 2012-01-04 ENCOUNTER — Encounter
Payer: Medicare Other | Attending: Physical Medicine and Rehabilitation | Admitting: Physical Medicine and Rehabilitation

## 2012-01-04 ENCOUNTER — Encounter: Payer: Self-pay | Admitting: Physical Medicine and Rehabilitation

## 2012-01-04 ENCOUNTER — Encounter: Payer: Self-pay | Admitting: Family Medicine

## 2012-01-04 DIAGNOSIS — M810 Age-related osteoporosis without current pathological fracture: Secondary | ICD-10-CM

## 2012-01-04 DIAGNOSIS — Z720 Tobacco use: Secondary | ICD-10-CM

## 2012-01-04 DIAGNOSIS — H02419 Mechanical ptosis of unspecified eyelid: Secondary | ICD-10-CM

## 2012-01-04 DIAGNOSIS — M5137 Other intervertebral disc degeneration, lumbosacral region: Secondary | ICD-10-CM | POA: Insufficient documentation

## 2012-01-04 DIAGNOSIS — F411 Generalized anxiety disorder: Secondary | ICD-10-CM

## 2012-01-04 DIAGNOSIS — M542 Cervicalgia: Secondary | ICD-10-CM | POA: Insufficient documentation

## 2012-01-04 DIAGNOSIS — M545 Low back pain, unspecified: Secondary | ICD-10-CM | POA: Insufficient documentation

## 2012-01-04 DIAGNOSIS — K219 Gastro-esophageal reflux disease without esophagitis: Secondary | ICD-10-CM | POA: Insufficient documentation

## 2012-01-04 DIAGNOSIS — G894 Chronic pain syndrome: Secondary | ICD-10-CM

## 2012-01-04 DIAGNOSIS — F329 Major depressive disorder, single episode, unspecified: Secondary | ICD-10-CM

## 2012-01-04 DIAGNOSIS — M47817 Spondylosis without myelopathy or radiculopathy, lumbosacral region: Secondary | ICD-10-CM | POA: Insufficient documentation

## 2012-01-04 DIAGNOSIS — M47812 Spondylosis without myelopathy or radiculopathy, cervical region: Secondary | ICD-10-CM | POA: Insufficient documentation

## 2012-01-04 DIAGNOSIS — F17200 Nicotine dependence, unspecified, uncomplicated: Secondary | ICD-10-CM

## 2012-01-04 DIAGNOSIS — J449 Chronic obstructive pulmonary disease, unspecified: Secondary | ICD-10-CM

## 2012-01-04 DIAGNOSIS — K589 Irritable bowel syndrome without diarrhea: Secondary | ICD-10-CM

## 2012-01-04 DIAGNOSIS — L820 Inflamed seborrheic keratosis: Secondary | ICD-10-CM

## 2012-01-04 DIAGNOSIS — M51379 Other intervertebral disc degeneration, lumbosacral region without mention of lumbar back pain or lower extremity pain: Secondary | ICD-10-CM | POA: Insufficient documentation

## 2012-01-04 MED ORDER — ESOMEPRAZOLE MAGNESIUM 40 MG PO CPDR
40.0000 mg | DELAYED_RELEASE_CAPSULE | Freq: Every day | ORAL | Status: DC
Start: 2012-01-04 — End: 2012-10-28

## 2012-01-04 NOTE — Progress Notes (Signed)
Subjective:    Patient ID: Allison Lee, female    DOB: [birth year], 56 y.o.   MRN: [id number]  HPI The patient is a 56 year old woman who [facility] chronic pain complaints which are related to her neck, her low back, her left upper extremity and her left lower extremity. She has known cervical spondylolysis and has been following up with Dr. Alveda Reasons Re: trip a treatment plan for her neck. At this time she does not feel surgery is going to be a viable option for her and she would request to continue to be followed at the [facility] using medical management. At the last visit she had been using OxyContin 10 mg twice a day and oxycodone 10 mg not more than 3 per day. This was a total of 50 mg of oxycodone per day area After long discussion at the last visit changes were made to her medications. Her long acting OxyContin was completely discontinued and she was placed on short acting oxycodone 2 tablets at 5 AM 1 tablet 10 AM 1 tablet at 2 PM and 1-2 tablets at 10 PM. She was not to take more than 5 tablets per day, for a total of 50 mg per day.  She tells me this new scheduling is more helpful for her pain then when she was on long acting OxyContin.  She has had a very rough month in that her father was hospitalized for approximately 9 days and has subsequently passed away. She is sad and depress today but is not suicidal. States she has support from other family members currently.      Pain Inventory Average Pain 8 Pain Right Now 8 My pain is constant, sharp, burning and stabbing  In the last 24 hours, has pain interfered with the following? General activity 8 Relation with others 9 Enjoyment of life 9 What TIME of day is your pain at its worst? all day Sleep (in general) Poor  Pain is worse with: walking, bending, sitting, standing and use of arms and hands  Pain improves with: rest and medication Relief from Meds:  4  Mobility use a cane how many minutes can you walk? 1---2 ability to climb steps?  yes do you drive?  yes  Function disabled: date disabled July 2006  Neuro/Psych weakness numbness tingling trouble walking spasms confusion depression anxiety  Prior Studies x-rays CT/MRI nerve study  Physicians involved in your care Primary care Dr Aniceto Boss Psychiatrist Dr March Rummage Neurosurgeon Dr Merril Abbe Dr Alveda Reasons       Review of Systems  HENT: Positive for neck pain and neck stiffness.   Eyes: Negative.   Respiratory: Negative.   Cardiovascular: Negative.   Gastrointestinal: Positive for constipation.  Genitourinary: Negative.   Musculoskeletal: Positive for myalgias, back pain, arthralgias and gait problem.  Skin: Negative.   Neurological: Positive for weakness, numbness and headaches.  Hematological: Negative.   Psychiatric/Behavioral: Positive for sleep disturbance and decreased concentration. The patient is nervous/anxious.        Objective:   Physical Exam On exam she is alert and oriented. She answers questions appropriately and follows commands without difficulty.  Cranial nerves are intact, coordination is intact.  Sensory is intact in both upper extremities. She is decreased sensation which is patch he especially the left lower extremity. Motor strength is good in both upper and lower extremities without obvious focal deficit.  Transitions easily from sitting to standing. Gait is slow  not antalgic. Tandem gait and Romberg test are performed adequately.  Diminished range of motion is notable in cervical spine especially with rotation to the left. She has decreased left shoulder motion with abduction. She has decreased lumbar motion in all planes.       Assessment & Plan:  1. Cervical spondylolysis with chronic neck pain and decreased range of motion.  2. Lumbar degenerative disc disease as well as facet arthropathy and chronic low back pain.  3.  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 Patient has significant intolerance to lyrica and gabapentin. She has been trialed on these in the past and has been unable to take them due to significant lower extremity edema.  4. Chronic pain syndrome  Plan she has found oxycodone to be quite helpful in the management of her above pain complaints. The Opioid medication pill count was appropriate.  No reported significant side effects of Opioid medications noted.  No aberrant behavior noted.  Patient cautioned regarding operation of machinery or vehicles.  Patient understands risk and benefits of these medications.  There is no indication or report of risk to self or others.  Last UDS 09.25.12 and was consistant.  She is currently satisfied with her pain management. I have answered all her questions and will have nursing staff follow her up in one month to refill her medications.

## 2012-01-04 NOTE — Patient Instructions (Signed)
Followup with nursing staff in one month for a medication refill. Would like to see you walking 5-10 minutes several times a day.Back Pain, Adult Back pain is very common. The pain often gets better over time. The cause of back pain is usually not dangerous. Most people can learn to manage their back pain on their own.  HOME CARE   Stay active. Start with short walks on flat ground if you can. Try to walk farther each day.   Do not sit, drive, or stand in one place for more than 30 minutes. Do not stay in bed.   Do not avoid exercise or work. Activity can help your back heal faster.   Be careful when you bend or lift an object. Bend at your knees, keep the object close to you, and do not twist.   Sleep on a firm mattress. Lie on your side, and bend your knees. If you lie on your back, put a pillow under your knees.   Only take medicines as told by your doctor.   Put ice on the injured area.   Put ice in a plastic bag.   Place a towel between your skin and the bag.   Leave the ice on for 15 to 20 minutes, 3 to 4 times a day for the first 2 to 3 days. After that, you can switch between ice and heat packs.   Ask your doctor about back exercises or massage.   Avoid feeling anxious or stressed. Find good ways to deal with stress, such as exercise.  GET HELP RIGHT AWAY IF:   Your pain does not go away with rest or medicine.   Your pain does not go away in 1 week.   You have new problems.   You do not feel well.   The pain spreads into your legs.   You cannot control when you poop (bowel movement) or pee (urinate).   Your arms or legs feel weak or lose feeling (numbness).   You feel sick to your stomach (nauseous) or throw up (vomit).   You have belly (abdominal) pain.   You feel like you may pass out (faint).  MAKE SURE YOU:   Understand these instructions.   Will watch your condition.   Will get help right away if you are not doing well or get worse.  Document  Released: 04/06/2008 Document Revised: 10/08/2011 Document Reviewed: 03/09/2011 [facility] Patient Information 2012 [place], [place].

## 2012-01-04 NOTE — Progress Notes (Signed)
  Subjective:    Patient ID: Allison Lee, female    DOB: [birth year], 56 y.o.   MRN: [id number]  HPI  Patient presents after having lost her father(hepatocellular carcinoma secondary to cirrosis) last Friday. Patient had a tumultuous relationship with her father. Patients father lived at her house until shortly before he past away and this caused a fair amount of discord with her husband. Hospice has offered bereavement counseling. Patient also is seen regularly at the [facility].  Patient requests referral to Dr. Nile Riggs as upper lids are sagging and would like corrective surgery.  Patient has follow up appointment with Dr. Noel Gerold for chronic cervical and lumbar pain.  Patient states she has has a flair of pain recently due to the pinched nerve in her neck.  Patient continues to smoke 1 pack per day.  Patient declines increase in SOB over baseline.  No recent COPD flairs.  Patient has spot on (R) chest and would like removed.  Review of Systems     Objective:   Physical Exam  Constitutional: She appears well-developed and well-nourished.  HENT:  Mouth/Throat: Oropharynx is clear and moist.  Neck: Neck supple. No thyromegaly present.  Cardiovascular: Normal rate, regular rhythm and normal heart sounds.   Pulmonary/Chest: Effort normal and breath sounds normal.  Abdominal: Soft. Bowel sounds are normal.  Musculoskeletal: Normal range of motion.  Neurological: She is alert.  Skin: Skin is warm.       seborrhea keratosis on (R) chest   After informed consent LN2 applied to lesion (R) chest x 3.      Assessment & Plan:   1. COPD (chronic obstructive pulmonary disease)  Encouraged tobacco cessation.  Offered help when patient ready.  2. Elevated BP  Patient to BP at home and follow up with me in 1 month.  3. Chronic pain disorder  Keep follow up with Dr. Noel Gerold  4. Seborrheic keratosis  S/P Liquid N2 therapy, wound care reviewed  5. Ptosis, mechanical   Ambulatory referral to Ophthalmology  6. Osteoporosis    7. IBS (irritable bowel syndrome)    8. GERD (gastroesophageal reflux disease)  esomeprazole (NEXIUM) 40 MG capsule  9. Depression  Encouraged patient to follow through with counseling with [facility] and keep follow up appointment with [facility] counseling center.  10. Tobacco abuse     Follow up is in 1 month

## 2012-01-05 DIAGNOSIS — IMO0001 Reserved for inherently not codable concepts without codable children: Secondary | ICD-10-CM | POA: Insufficient documentation

## 2012-01-05 DIAGNOSIS — G894 Chronic pain syndrome: Secondary | ICD-10-CM | POA: Insufficient documentation

## 2012-01-27 ENCOUNTER — Telehealth: Payer: Self-pay | Admitting: Physical Medicine and Rehabilitation

## 2012-01-27 NOTE — Telephone Encounter (Signed)
Pt aware that her rx will be written like this when she comes in on Monday.

## 2012-01-27 NOTE — Telephone Encounter (Signed)
Oxycodone written for 2/5am, 1/10am, 1/2pm, 1-2/bedtime, but filled for only 1/bedtime.  Has RN appointment on Monday, will Dr please write for 2 @ bedtime?  Patient has fallen.

## 2012-02-01 ENCOUNTER — Encounter: Payer: Medicare Other | Attending: Physical Medicine and Rehabilitation | Admitting: *Deleted

## 2012-02-01 ENCOUNTER — Encounter: Payer: Self-pay | Admitting: *Deleted

## 2012-02-01 VITALS — BP 109/72 | HR 100 | Resp 18 | Ht 65.5 in | Wt 113.0 lb

## 2012-02-01 DIAGNOSIS — M545 Low back pain, unspecified: Secondary | ICD-10-CM

## 2012-02-01 DIAGNOSIS — M5137 Other intervertebral disc degeneration, lumbosacral region: Secondary | ICD-10-CM | POA: Insufficient documentation

## 2012-02-01 DIAGNOSIS — G894 Chronic pain syndrome: Secondary | ICD-10-CM

## 2012-02-01 DIAGNOSIS — G8929 Other chronic pain: Secondary | ICD-10-CM | POA: Insufficient documentation

## 2012-02-01 DIAGNOSIS — M47812 Spondylosis without myelopathy or radiculopathy, cervical region: Secondary | ICD-10-CM | POA: Insufficient documentation

## 2012-02-01 DIAGNOSIS — M542 Cervicalgia: Secondary | ICD-10-CM | POA: Insufficient documentation

## 2012-02-01 DIAGNOSIS — M51379 Other intervertebral disc degeneration, lumbosacral region without mention of lumbar back pain or lower extremity pain: Secondary | ICD-10-CM | POA: Insufficient documentation

## 2012-02-01 DIAGNOSIS — M79609 Pain in unspecified limb: Secondary | ICD-10-CM | POA: Insufficient documentation

## 2012-02-01 DIAGNOSIS — G609 Hereditary and idiopathic neuropathy, unspecified: Secondary | ICD-10-CM | POA: Insufficient documentation

## 2012-02-01 MED ORDER — CYCLOBENZAPRINE HCL 5 MG PO TABS: 5.0000 mg | ORAL_TABLET | Freq: Three times a day (TID) | ORAL | Status: DC

## 2012-02-01 MED ORDER — OXYCODONE HCL 10 MG PO TABS: 10.0000 mg | ORAL_TABLET | Freq: Every day | ORAL | Status: DC

## 2012-02-01 NOTE — Progress Notes (Signed)
Eval by Dr Noel Gerold. She is going to need more neck surgery, the plate is being pinched. Episode of leg giving out on her, causing a fall onto concrete, and she broke her left wrist. In a cast. Took 2 pain pills q HS due to this, and count is short today. Discussed taking 5 per day, verbalizes understanding. May need a few more pills per month, she will discuss again with Dr Pamelia Hoit.

## 2012-02-02 ENCOUNTER — Encounter: Payer: Self-pay | Admitting: Physical Medicine and Rehabilitation

## 2012-02-08 ENCOUNTER — Ambulatory Visit: Payer: Medicare Other | Admitting: Family Medicine

## 2012-02-29 ENCOUNTER — Encounter: Payer: Self-pay | Admitting: Physical Medicine and Rehabilitation

## 2012-02-29 ENCOUNTER — Encounter (HOSPITAL_BASED_OUTPATIENT_CLINIC_OR_DEPARTMENT_OTHER): Payer: Medicare Other | Admitting: Physical Medicine and Rehabilitation

## 2012-02-29 VITALS — BP 126/75 | HR 99 | Resp 16 | Ht 66.0 in | Wt 118.0 lb

## 2012-02-29 DIAGNOSIS — G5790 Unspecified mononeuropathy of unspecified lower limb: Secondary | ICD-10-CM

## 2012-02-29 DIAGNOSIS — G894 Chronic pain syndrome: Secondary | ICD-10-CM

## 2012-02-29 DIAGNOSIS — M545 Low back pain, unspecified: Secondary | ICD-10-CM

## 2012-02-29 DIAGNOSIS — M542 Cervicalgia: Secondary | ICD-10-CM

## 2012-02-29 DIAGNOSIS — M79605 Pain in left leg: Secondary | ICD-10-CM | POA: Insufficient documentation

## 2012-02-29 DIAGNOSIS — M792 Neuralgia and neuritis, unspecified: Secondary | ICD-10-CM | POA: Insufficient documentation

## 2012-02-29 MED ORDER — OXYCODONE HCL 10 MG PO TABS: 10.0000 mg | ORAL_TABLET | Freq: Every day | ORAL | Status: DC

## 2012-02-29 MED ORDER — OXYCODONE HCL 10 MG PO TABS: 10.0000 mg | ORAL_TABLET | Freq: Four times a day (QID) | ORAL | Status: DC

## 2012-02-29 NOTE — Progress Notes (Signed)
Subjective:    Patient ID: Allison Lee, female    DOB: 1955-12-12, 56 y.o.   MRN: [id number]  HPI  The patient is a 56 year old woman who [facility] chronic pain complaints which are related to her neck, her low back, her left upper extremity and her left lower extremity.   She has known cervical spondylolysis and has been following up with Dr. Noel Gerold re: treatment plan for her neck.    At this time she does not feel surgery is going to be a viable option for her and she would request to continue to be followed at the [facility] using medical management.    At the last visit she had been using OxyContin 10 mg twice a day and oxycodone 10 mg not more than 3 per day. This was a total of 50 mg of oxycodone per day area After long discussion at the last visit changes were made to her medications. Her long acting OxyContin was completely discontinued and she was placed on short acting oxycodone 2 tablets at 5 AM 1 tablet 10 AM 1 tablet at 2 PM and 1-2 tablets at 10 PM. She was not to take more than 5 tablets per day, for a total of 50 mg per day.  She tells me this new scheduling is more helpful for her pain then when she was on long acting OxyContin.     Father recently passed away. States she has support from other family members currently.   She reports breaking her wrist at the end of February and just got out of her cast.  Is currently wearing a removable splint on her left wrist. She has been managed by Dr. Madelon Lips.  Recently saw Dr. Noel Gerold, she was told by him that she has some hardware problems from her previous surgery. He has indicated to her that a surgical option would be of benefit. She tells me today that she is scheduled for cervical spine surgery on 03/09/2012 by Dr. Noel Gerold. She tells me he mentioned to her that she may get 50% improvement in her current neck and arm pain.      Pain Inventory Average Pain  8 Pain Right Now 7 My pain is dull and aching  In the last 24 hours, has pain interfered with the following? General activity 9 Relation with others 8 Enjoyment of life 8 What TIME of day is your pain at its worst? all the time Sleep (in general) Poor  Pain is worse with: walking, bending, sitting, inactivity, standing and some activites Pain improves with: rest and medication Relief from Meds: 4  Mobility use a cane how many minutes can you walk? 3-5 min ability to climb steps?  yes do you drive?  yes transfers alone  Function disabled: date disabled 2007  Neuro/Psych weakness numbness tingling trouble walking spasms depression anxiety  Prior Studies Any changes since last visit?  no  Physicians involved in your care Any changes since last visit?  no        Review of Systems  HENT: Negative.   Eyes: Negative.   Respiratory: Negative.   Cardiovascular: Negative.   Gastrointestinal: Negative.   Genitourinary: Negative.   Musculoskeletal: Positive for joint swelling.  Skin: Negative.   Neurological: Positive for weakness and numbness.  Hematological: Negative.   Psychiatric/Behavioral: Positive for dysphoric mood. Negative for sleep disturbance. The patient is nervous/anxious.        Objective:  Physical Exam  On exam she is alert and oriented. She answers questions appropriately and follows commands without difficulty.   Cranial nerves are intact, coordination is intact.    Sensory is intact in both upper extremities. She is decreased sensation which is especially the left lower extremity.   Motor strength is good in both  lower extremities without obvious focal deficit. She is unable to give full effort with left upper extremity and manual muscle testing reveals decreased strength throughout. She is wearing a wrist splint on her left wrist.   Transitions easily from sitting to standing. Gait is slow not antalgic. Tandem gait and Romberg test  are performed adequately.    Diminished range of motion is notable in cervical spine especially with rotation to the left.   She has decreased left shoulder motion with abduction.    She has decreased lumbar motion in all planes.    Gait is stable but slow.       Assessment & Plan:  1. Cervical spondylolysis with chronic neck pain and decreased range of motion, chronic left upper extremity pain. Patient is scheduled for cervical spine surgery per Dr Noel Gerold on Mar 09, 2012.   2. Lumbar degenerative disc disease as well as facet arthropathy and chronic low back pain.    3. 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 Patient has significant intolerance to lyrica and gabapentin. She has been trialed on these in the past and has been unable to take them due to significant lower extremity edema.    4. Chronic pain syndrome    Plan she has found oxycodone to be quite helpful in the management of her above pain complaints.  The Opioid medication pill count was appropriate. No reported significant side effects of Opioid medications noted. No aberrant behavior noted. Patient cautioned regarding operation of machinery or vehicles. Patient understands risk and benefits of these medications. There is no indication or report of risk to self or others.   We have discussed today that Dr. Lauro Regulus will be managing her pain postoperatively. I will see her back in 3 months unless she calls and wishes to be seen sooner.  She was given a 10 day supply of her pain medications, oxycodone 10 mg not more than 6 per day.  She is unable to take Lyrica or gabapentin because of extremity swelling.   Last UDS 09.25.12 and was consistant.    She is currently satisfied with her pain management. I have answered all her questions and will have nursing staff follow her up in one month to refill her medications.   Since her surgery is next week I will give her a 10 day supply of her pain  medications today.

## 2012-02-29 NOTE — Patient Instructions (Signed)
I anticipate that DrCohen will be managing your pain medication after he operates on your neck.  I will schedule you to follow back up with me in 3 months. Let me know if you need to see me sooner or if you need to delay this followup visit.  Jeanella Anton make sure you keep your pain medication and locked up in a secure location.

## 2012-03-08 ENCOUNTER — Encounter: Payer: Self-pay | Admitting: Physical Medicine and Rehabilitation

## 2012-04-07 ENCOUNTER — Other Ambulatory Visit: Payer: Self-pay | Admitting: Endocrinology

## 2012-04-07 DIAGNOSIS — E049 Nontoxic goiter, unspecified: Secondary | ICD-10-CM

## 2012-05-27 ENCOUNTER — Encounter: Payer: Medicare Other | Admitting: Physical Medicine and Rehabilitation

## 2012-07-11 ENCOUNTER — Ambulatory Visit
Admission: RE | Admit: 2012-07-11 | Discharge: 2012-07-11 | Disposition: A | Discharge status: Home / Self Care | Payer: Medicare Other | Source: Ambulatory Visit | Attending: Endocrinology | Admitting: Endocrinology

## 2012-07-11 DIAGNOSIS — E049 Nontoxic goiter, unspecified: Secondary | ICD-10-CM

## 2012-07-15 ENCOUNTER — Other Ambulatory Visit: Payer: Self-pay | Admitting: Endocrinology

## 2012-07-15 DIAGNOSIS — E042 Nontoxic multinodular goiter: Secondary | ICD-10-CM

## 2012-07-19 ENCOUNTER — Other Ambulatory Visit (HOSPITAL_COMMUNITY)
Admission: RE | Admit: 2012-07-19 | Discharge: 2012-07-19 | Disposition: A | Discharge status: Home / Self Care | Payer: Medicare Other | Source: Ambulatory Visit | Attending: Interventional Radiology | Admitting: Interventional Radiology

## 2012-07-19 ENCOUNTER — Ambulatory Visit
Admission: RE | Admit: 2012-07-19 | Discharge: 2012-07-19 | Disposition: A | Discharge status: Home / Self Care | Payer: Medicare Other | Source: Ambulatory Visit | Attending: Endocrinology | Admitting: Endocrinology

## 2012-07-19 DIAGNOSIS — E042 Nontoxic multinodular goiter: Secondary | ICD-10-CM

## 2012-07-19 DIAGNOSIS — E049 Nontoxic goiter, unspecified: Secondary | ICD-10-CM | POA: Insufficient documentation

## 2012-09-12 ENCOUNTER — Ambulatory Visit: Payer: Self-pay | Admitting: Physical Medicine and Rehabilitation

## 2012-09-21 ENCOUNTER — Encounter: Payer: Medicare Other | Admitting: Physical Medicine and Rehabilitation

## 2012-10-05 ENCOUNTER — Telehealth: Payer: Self-pay | Admitting: *Deleted

## 2012-10-05 NOTE — Telephone Encounter (Signed)
Patient called stating she has the Flu. She had to cancel her upcoming appointment with Dr. Pamelia Hoit. She is currently being Prescribed Oxycodone 15mg . Originally Prescribed by Dr. Thomasena Edis her surgeon. He will not refill this medication. States she needs to see pain management. Please advise.

## 2012-10-05 NOTE — Telephone Encounter (Signed)
Called and notified patient that she has not been seen in our office since April. Would need to contact surgeon to see if they can Prescribe the Oxycodone until her appointment with Dr. Pamelia Hoit on 12/20.

## 2012-10-21 ENCOUNTER — Encounter
Payer: Medicare Other | Attending: Physical Medicine and Rehabilitation | Admitting: Physical Medicine and Rehabilitation

## 2012-10-21 ENCOUNTER — Encounter: Payer: Self-pay | Admitting: Physical Medicine and Rehabilitation

## 2012-10-21 VITALS — BP 121/80 | HR 104 | Resp 14 | Ht 66.0 in | Wt 126.0 lb

## 2012-10-21 DIAGNOSIS — M542 Cervicalgia: Secondary | ICD-10-CM

## 2012-10-21 DIAGNOSIS — Z5181 Encounter for therapeutic drug level monitoring: Secondary | ICD-10-CM | POA: Insufficient documentation

## 2012-10-21 DIAGNOSIS — Z79899 Other long term (current) drug therapy: Secondary | ICD-10-CM

## 2012-10-21 DIAGNOSIS — M545 Low back pain, unspecified: Secondary | ICD-10-CM

## 2012-10-21 DIAGNOSIS — M51379 Other intervertebral disc degeneration, lumbosacral region without mention of lumbar back pain or lower extremity pain: Secondary | ICD-10-CM | POA: Insufficient documentation

## 2012-10-21 DIAGNOSIS — M47812 Spondylosis without myelopathy or radiculopathy, cervical region: Secondary | ICD-10-CM | POA: Insufficient documentation

## 2012-10-21 DIAGNOSIS — M792 Neuralgia and neuritis, unspecified: Secondary | ICD-10-CM

## 2012-10-21 DIAGNOSIS — M79605 Pain in left leg: Secondary | ICD-10-CM

## 2012-10-21 DIAGNOSIS — G894 Chronic pain syndrome: Secondary | ICD-10-CM | POA: Insufficient documentation

## 2012-10-21 DIAGNOSIS — M5412 Radiculopathy, cervical region: Secondary | ICD-10-CM

## 2012-10-21 DIAGNOSIS — M5137 Other intervertebral disc degeneration, lumbosacral region: Secondary | ICD-10-CM | POA: Insufficient documentation

## 2012-10-21 DIAGNOSIS — G609 Hereditary and idiopathic neuropathy, unspecified: Secondary | ICD-10-CM | POA: Insufficient documentation

## 2012-10-21 LAB — POCT URINE DRUG SCREEN

## 2012-10-21 MED ORDER — OXYCODONE HCL 15 MG PO TABS: 15.0000 mg | ORAL_TABLET | ORAL | Status: DC | PRN

## 2012-10-21 NOTE — Progress Notes (Signed)
Subjective:    Patient ID: Allison Lee, female    DOB: Jun 06, 1956, 56 y.o.   MRN: [id number]  [facility]  HPIThe patient is a 56 year old woman who is been followed here in the past for chronic pain complaints which are related to her neck, her low back, her left upper extremity and her left lower extremity.  She has known cervical and lumbar spondylosis.  She was last seen here in April of 2014.  In the interim she has undergone cervical spine surgery per Dr. Noel Gerold 03/09/2012  .  She reports no improvement in her pain, her pain was indicated as an 8 on a scale of 10 prior to surgery and currently is a 7 on a scale of 10.  She is here today for a refill of her pain medication. She is currently using oxycodone 15 mg 2 tablets 4 times a day.  She has an intolerance to Lyrica and gabapentin.  Her pain is located in her neck periscapular region and especially left upper extremity. She also has chronic back pain and left leg pain.  Her pain drawing is almost identical to the drawing she made in April of this year.    Pain Inventory Average Pain 7 Pain Right Now 7 My pain is constant, sharp, burning, stabbing, tingling and aching  In the last 24 hours, has pain interfered with the following? General activity 8 Relation with others 8 Enjoyment of life 9 What TIME of day is your pain at its worst? all the time Sleep (in general) Poor  Pain is worse with: walking, bending, sitting and standing Pain improves with: rest and medication Relief from Meds: 7  Mobility use a cane how many minutes can you walk? 5 ability to climb steps?  yes do you drive?  yes transfers alone Do you have any goals in this area?  no  Function disabled: date disabled 7/05 I need assistance with the following:  meal prep, household duties and shopping  Neuro/Psych weakness numbness tingling spasms depression anxiety  Prior Studies bone  scan x-rays CT/MRI nerve study surgery  Physicians involved in your care Kizzie Fantasia   Family History  Problem Relation Age of Onset  . COPD Mother   . Cancer Mother   . Dementia Mother   . Cancer Father   . Cancer Brother   . Cirrhosis Brother    History   Social History  . Marital Status: Married    Spouse Name: N/A    Number of Children: N/A  . Years of Education: N/A   Social History Main Topics  . Smoking status: Current Every Day Smoker -- 1.0 packs/day for 35 years    Types: Cigarettes  . Smokeless tobacco: None     Comment: father died recently; doesn't think she can quit right now  . Alcohol Use: No  . Drug Use: No  . Sexually Active: None   Other Topics Concern  . None   Social History Narrative  . None   Past Surgical History  Procedure Date  . Cervical spine surgery   . Foot surgery 2009  . Spine surgery     C-spine   Past Medical History  Diagnosis Date  . COPD (chronic obstructive pulmonary disease)   . Dysthymia   . Osteoporosis   . IBS (irritable bowel syndrome)   . Other chronic pain   . Arthritis   . GERD (gastroesophageal reflux disease)    BP 121/80  Pulse 104  Resp 14  Ht 5\' 6"  (1.676 m)  Wt 126 lb (57.153 kg)  BMI 20.34 kg/m2  SpO2 100%     Review of Systems  HENT: Positive for neck pain.   Gastrointestinal: Positive for constipation.  Musculoskeletal: Positive for myalgias, back pain and arthralgias.  Neurological: Positive for weakness and numbness.  Psychiatric/Behavioral: Positive for dysphoric mood. The patient is nervous/anxious.   All other systems reviewed and are negative.       Objective:   Physical Exam  On exam she is alert and oriented. She answers questions appropriately and follows commands without difficulty.  Cranial nerves are intact, coordination is intact.  Sensory is intact in both upper extremities. She is decreased sensation which is especially the left lower extremity.  Motor  strength is good in both lower extremities without obvious focal deficit.  She has good strength in both upper extremities with the exception of 4/5 strength of left intrinsics. She gives somewhat better effort and then she did prior to surgery.  Transitions easily from sitting to standing. Gait is slow not antalgic. Tandem gait and Romberg test are performed adequately.  Diminished range of motion is notable in cervical spine especially with rotation to the left.  She has decreased left shoulder motion with abduction.  She has decreased lumbar motion in all planes.  Gait is stable but slow.        Assessment & Plan:  1. Cervical spondylolysis with chronic neck pain and decreased range of motion, chronic left upper extremity pain. Patient is s/p cervical spine surgery per Dr Noel Gerold on Mar 09, 2012. No notes available regarding procedure.    2. Lumbar degenerative disc disease as well as facet arthropathy and chronic low back pain. Stable.  3. 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 Patient has significant intolerance to lyrica and gabapentin. She has been trialed on these in the past and has been unable to take them due to significant lower extremity edema.    4. Chronic pain syndrome   Plan she has found oxycodone to be quite helpful in the management of her above pain complaints.  The Opioid medication pill count was appropriate. No reported significant side effects of Opioid medications noted. No aberrant behavior noted. Patient cautioned regarding operation of machinery or vehicles. Patient understands risk and benefits of these medications. There is no indication or report of risk to self or others.    Last UDS 09.25.12 and was consistant. Will obtain UDS again today  Refill her pain medication. She is currently using oxycodone 15 mg 2 tablets 4 times a day. (8 tablets per day)  I told her I would like to see her decrease her dose of her pain medication.  I  in decreasing her dose from 15 mg 2 tablets 4 times a day to 15 mg 1 tablet every 4 hours ( 6 tablets per day). One week supply given.   We spent 15 minutes discussing the importance of activity. I would like to see her get involved in  aquatic therapy after the holidays  We discussed smoking cessation.  She agrees to the above plan I will see her back next week.

## 2012-10-21 NOTE — Patient Instructions (Addendum)
I have refilled your pain medication and given you a one week supply and will see you back next Friday  Next month we have discussed consideration of aquatic therapy for you    Benefits of regular physical activity  Reduce the risk of of dying prematurely  Reduces the risk of dying from heart disease  Reduces the risk of stroke  Reduces the risk of developing diabetes  Reduces the risk of developing high blood pressure  Helped reduce blood pressure in people who already have high blood pressure  Reduces the risk of developing colon Cancer  Reduces feelings of depression and anxiety  Help control weight  Helps  maintain healthy bones, muscles and joints,helps with joint range of motion, muscle strength, general endurance.  Helps older adults become stronger and better able to move without falling  Promote psychological well being

## 2012-10-28 ENCOUNTER — Encounter (HOSPITAL_BASED_OUTPATIENT_CLINIC_OR_DEPARTMENT_OTHER): Payer: Medicare Other | Admitting: Physical Medicine and Rehabilitation

## 2012-10-28 ENCOUNTER — Encounter: Payer: Self-pay | Admitting: Physical Medicine and Rehabilitation

## 2012-10-28 VITALS — BP 111/71 | HR 114 | Resp 14 | Ht 66.0 in | Wt 135.0 lb

## 2012-10-28 DIAGNOSIS — Z5181 Encounter for therapeutic drug level monitoring: Secondary | ICD-10-CM

## 2012-10-28 DIAGNOSIS — M545 Low back pain, unspecified: Secondary | ICD-10-CM

## 2012-10-28 DIAGNOSIS — M542 Cervicalgia: Secondary | ICD-10-CM

## 2012-10-28 DIAGNOSIS — M792 Neuralgia and neuritis, unspecified: Secondary | ICD-10-CM

## 2012-10-28 DIAGNOSIS — M79605 Pain in left leg: Secondary | ICD-10-CM

## 2012-10-28 DIAGNOSIS — M5412 Radiculopathy, cervical region: Secondary | ICD-10-CM

## 2012-10-28 DIAGNOSIS — Z79899 Other long term (current) drug therapy: Secondary | ICD-10-CM

## 2012-10-28 MED ORDER — OXYCODONE HCL 15 MG PO TABS: 15.0000 mg | ORAL_TABLET | ORAL | Status: DC | PRN

## 2012-10-28 MED ORDER — CYCLOBENZAPRINE HCL 5 MG PO TABS: 5.0000 mg | ORAL_TABLET | Freq: Three times a day (TID) | ORAL | Status: DC

## 2012-10-28 NOTE — Progress Notes (Signed)
Subjective:    Patient ID: Allison Lee, female    DOB: 31-Aug-1956, 56 y.o.   MRN: [id number]  HPI The patient is a 56 year old woman who is been followed here in the past for chronic pain complaints which are related to her neck, her low back, her left upper extremity and her left lower extremity.  She has known cervical and lumbar spondylosis.   She has undergone cervical spine surgery per Dr. Noel Gerold 03/09/2012 .  She reports no improvement in her pain, her pain was indicated as an 8 on a scale of 10 prior to surgery and currently is a 7 on a scale of 10.   She is here today for a refill of her pain medication. She is currently using oxycodone 15 mg q 4 hours.  She has an intolerance to Lyrica and gabapentin.  Her pain is located in her neck periscapular region and especially left upper extremity. She also has chronic back pain and left leg pain. Her pain drawing is almost identical to the drawing she made in April of this year.   She was last seen here one week ago. She was given a week supply the last visit with the dose reduction. She is here for a refill of her pain medication and further dose reduction.  There has been no change in her pain inventory she continues to average a 7 on a scale of 10 she reports fair to good relief with current medications.  Pain Inventory Average Pain 7 Pain Right Now 7 My pain is constant  In the last 24 hours, has pain interfered with the following? General activity 8 Relation with others 7 Enjoyment of life 7 What TIME of day is your pain at its worst? varies Sleep (in general) Poor  Pain is worse with: walking, bending, sitting and standing Pain improves with: rest and medication Relief from Meds: 6  Mobility use a cane how many minutes can you walk? 5 ability to climb steps?  yes do you drive?  yes  Function disabled: date disabled 2005 I need assistance with the following:  dressing, meal prep, household duties and  shopping  Neuro/Psych weakness numbness tingling trouble walking spasms depression anxiety  Prior Studies Any changes since last visit?  no  Physicians involved in your care Dr Aniceto Boss, Dr Jennelle Human, Dr Noel Gerold, Dr Durward Mallard   Family History  Problem Relation Age of Onset  . COPD Mother   . Cancer Mother   . Dementia Mother   . Cancer Father   . Cancer Brother   . Cirrhosis Brother    History   Social History  . Marital Status: Married    Spouse Name: N/A    Number of Children: N/A  . Years of Education: N/A   Social History Main Topics  . Smoking status: Current Every Day Smoker -- 1.0 packs/day for 35 years    Types: Cigarettes  . Smokeless tobacco: Never Used     Comment: trying to cut back  . Alcohol Use: No  . Drug Use: No  . Sexually Active: None   Other Topics Concern  . None   Social History Narrative  . None   Past Surgical History  Procedure Date  . Cervical spine surgery   . Foot surgery 2009  . Spine surgery     C-spine   Past Medical History  Diagnosis Date  . COPD (chronic obstructive pulmonary disease)   . Dysthymia   . Osteoporosis   . IBS (  irritable bowel syndrome)   . Other chronic pain   . Arthritis   . GERD (gastroesophageal reflux disease)    BP 111/71  Pulse 114  Resp 14  Ht 5\' 6"  (1.676 m)  Wt 135 lb (61.236 kg)  BMI 21.79 kg/m2  SpO2 99%     Review of Systems  HENT: Positive for neck pain.   Gastrointestinal: Positive for constipation.  Musculoskeletal: Positive for back pain, joint swelling and gait problem.  Neurological: Positive for weakness and numbness.  Psychiatric/Behavioral: Positive for dysphoric mood. The patient is nervous/anxious.   All other systems reviewed and are negative.       Objective:   Physical Exam  On exam she is alert and oriented. She answers questions appropriately and follows commands without difficulty.  Cranial nerves are intact, coordination is intact.  Sensory is intact in both  upper extremities. She is decreased sensation which is especially the left lower extremity.  Motor strength is good in both lower extremities without obvious focal deficit.  She has good strength in both upper extremities with the exception of 4/5 strength of left intrinsics. She gives somewhat better effort and then she did prior to surgery.  Transitions easily from sitting to standing. Gait is slow not antalgic. Tandem gait and Romberg test are performed adequately.  Diminished range of motion is notable in cervical spine in all planes and especially with rotation to the left.  She has decreased left shoulder motion with abduction to 90 degrees. She has decreased lumbar motion in all planes.  Gait is stable but slow.        Assessment & Plan:  1. Cervical spondylolysis with chronic neck pain and decreased range of motion, chronic left upper extremity pain. Patient is s/p cervical spine surgery per Dr Noel Gerold on Mar 09, 2012. No notes available regarding procedure.  2. Lumbar degenerative disc disease as well as facet arthropathy and chronic low back pain. Stable.  3. 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 Patient has significant intolerance to lyrica and gabapentin. She has been trialed on these in the past and has been unable to take them due to significant lower extremity edema.  4. Chronic pain syndrome  Plan she has found oxycodone to be quite helpful in the management of her above pain complaints.  The Opioid medication pill count was appropriate. No reported significant side effects of Opioid medications noted. No aberrant behavior noted. Patient cautioned regarding operation of machinery or vehicles. Patient understands risk and benefits of these medications. There is no indication or report of risk to self or others.  Last UDS 12.20.13 and was consistent.  Refill her pain medication. She is currently using oxycodone  15 mg 1 tablet every 4 hours (6 tablets per  day).    Oxycodone is reduced to 15 mg  1 tablet every 4 hours (5 tablets per day).    We spent 15 minutes discussing the importance of activity again today. I would like to see her get involved in aquatic therapy after the holidays. Will discuss further at next visit per her request.  She is also considering seeing Dr. Channing Mutters (neurosurgery) again.  We discussed smoking cessation.    She agrees to the above plan I will see her back next week.

## 2012-10-28 NOTE — Patient Instructions (Signed)
Please note that I have reduced your dose frequency slightly.  Please try to stay active. Use good body mechanics maintaining neutral cervical and lumbar spine.  I would like to see you walking each day.  Once again I'm going to encourage you to consider a community arthritis pool program  I will see you back in one month  Please make sure you keep your pain medications locked up and in a secure location.  Take your pain medications as prescribed

## 2012-11-10 ENCOUNTER — Other Ambulatory Visit: Payer: Self-pay | Admitting: Internal Medicine

## 2012-11-10 DIAGNOSIS — Z1231 Encounter for screening mammogram for malignant neoplasm of breast: Secondary | ICD-10-CM

## 2012-11-15 ENCOUNTER — Ambulatory Visit: Payer: Self-pay | Admitting: Family Medicine

## 2012-11-15 DIAGNOSIS — Z0289 Encounter for other administrative examinations: Secondary | ICD-10-CM

## 2012-11-23 ENCOUNTER — Encounter: Payer: Medicare Other | Admitting: Physical Medicine and Rehabilitation

## 2012-12-08 ENCOUNTER — Ambulatory Visit
Admission: RE | Admit: 2012-12-08 | Discharge: 2012-12-08 | Disposition: A | Discharge status: Home / Self Care | Payer: Medicare Other | Source: Ambulatory Visit | Attending: Internal Medicine | Admitting: Internal Medicine

## 2012-12-08 DIAGNOSIS — Z1231 Encounter for screening mammogram for malignant neoplasm of breast: Secondary | ICD-10-CM

## 2012-12-12 ENCOUNTER — Telehealth: Payer: Self-pay | Admitting: *Deleted

## 2012-12-12 NOTE — Telephone Encounter (Signed)
Duplicate message: ALEXSUS PAPADOPOULOS has found another MD and will not be returning to our office.

## 2012-12-12 NOTE — Telephone Encounter (Signed)
Allison Lee has found a new doctor to go to and will not be returning to our clinic.

## 2013-05-11 ENCOUNTER — Other Ambulatory Visit: Payer: Self-pay | Admitting: Pain Medicine

## 2013-05-11 DIAGNOSIS — M542 Cervicalgia: Secondary | ICD-10-CM

## 2013-05-11 DIAGNOSIS — M545 Low back pain: Secondary | ICD-10-CM

## 2013-05-16 ENCOUNTER — Ambulatory Visit
Admission: RE | Admit: 2013-05-16 | Discharge: 2013-05-16 | Disposition: A | Discharge status: Home / Self Care | Payer: Self-pay | Source: Ambulatory Visit | Attending: Pain Medicine | Admitting: Pain Medicine

## 2013-05-16 ENCOUNTER — Ambulatory Visit
Admission: RE | Admit: 2013-05-16 | Discharge: 2013-05-16 | Disposition: A | Discharge status: Home / Self Care | Payer: Medicare Other | Source: Ambulatory Visit | Attending: Pain Medicine | Admitting: Pain Medicine

## 2013-05-16 DIAGNOSIS — M545 Low back pain: Secondary | ICD-10-CM

## 2013-05-16 DIAGNOSIS — M542 Cervicalgia: Secondary | ICD-10-CM

## 2013-05-16 MED ORDER — GADOBENATE DIMEGLUMINE 529 MG/ML IV SOLN
12.0000 mL | Freq: Once | INTRAVENOUS | Status: AC | PRN
  Administered 2013-05-16: 12 mL via INTRAVENOUS

## 2013-11-02 DIAGNOSIS — Z86711 Personal history of pulmonary embolism: Secondary | ICD-10-CM

## 2013-11-02 HISTORY — DX: Personal history of pulmonary embolism: Z86.711

## 2013-12-25 ENCOUNTER — Other Ambulatory Visit: Payer: Self-pay

## 2013-12-25 DIAGNOSIS — Z1231 Encounter for screening mammogram for malignant neoplasm of breast: Secondary | ICD-10-CM

## 2014-01-17 ENCOUNTER — Ambulatory Visit: Payer: Medicare Other

## 2014-01-30 ENCOUNTER — Ambulatory Visit: Payer: Medicare Other

## 2014-02-01 ENCOUNTER — Ambulatory Visit: Payer: Medicare Other

## 2014-02-15 ENCOUNTER — Ambulatory Visit: Payer: Medicare Other

## 2014-03-02 ENCOUNTER — Ambulatory Visit
Admission: RE | Admit: 2014-03-02 | Discharge: 2014-03-02 | Disposition: A | Discharge status: Home / Self Care | Payer: Medicare Other | Source: Ambulatory Visit

## 2014-03-02 ENCOUNTER — Encounter (INDEPENDENT_AMBULATORY_CARE_PROVIDER_SITE_OTHER): Payer: Self-pay

## 2014-03-02 DIAGNOSIS — Z1231 Encounter for screening mammogram for malignant neoplasm of breast: Secondary | ICD-10-CM

## 2014-08-23 ENCOUNTER — Emergency Department (HOSPITAL_COMMUNITY): Payer: Medicare Other

## 2014-08-23 ENCOUNTER — Emergency Department (INDEPENDENT_AMBULATORY_CARE_PROVIDER_SITE_OTHER)
Admission: EM | Admit: 2014-08-23 | Discharge: 2014-08-25 | Disposition: A | Discharge status: Home / Self Care | Payer: Medicare Other | Source: Home / Self Care | Attending: Family Medicine | Admitting: Family Medicine

## 2014-08-23 ENCOUNTER — Encounter (HOSPITAL_COMMUNITY): Payer: Self-pay | Admitting: Emergency Medicine

## 2014-08-23 ENCOUNTER — Observation Stay (HOSPITAL_COMMUNITY)
Admission: EM | Admit: 2014-08-23 | Discharge: 2014-08-25 | Disposition: A | Discharge status: Home / Self Care | Payer: Medicare Other | Attending: Internal Medicine | Admitting: Internal Medicine

## 2014-08-23 DIAGNOSIS — R0781 Pleurodynia: Principal | ICD-10-CM | POA: Diagnosis present

## 2014-08-23 DIAGNOSIS — M545 Low back pain: Secondary | ICD-10-CM | POA: Insufficient documentation

## 2014-08-23 DIAGNOSIS — R079 Chest pain, unspecified: Secondary | ICD-10-CM

## 2014-08-23 DIAGNOSIS — E876 Hypokalemia: Secondary | ICD-10-CM | POA: Diagnosis not present

## 2014-08-23 DIAGNOSIS — J449 Chronic obstructive pulmonary disease, unspecified: Secondary | ICD-10-CM | POA: Diagnosis not present

## 2014-08-23 DIAGNOSIS — D649 Anemia, unspecified: Secondary | ICD-10-CM | POA: Diagnosis not present

## 2014-08-23 DIAGNOSIS — I2699 Other pulmonary embolism without acute cor pulmonale: Secondary | ICD-10-CM | POA: Diagnosis not present

## 2014-08-23 DIAGNOSIS — K219 Gastro-esophageal reflux disease without esophagitis: Secondary | ICD-10-CM | POA: Insufficient documentation

## 2014-08-23 DIAGNOSIS — Z79899 Other long term (current) drug therapy: Secondary | ICD-10-CM | POA: Insufficient documentation

## 2014-08-23 DIAGNOSIS — I10 Essential (primary) hypertension: Secondary | ICD-10-CM | POA: Insufficient documentation

## 2014-08-23 DIAGNOSIS — Z79891 Long term (current) use of opiate analgesic: Secondary | ICD-10-CM | POA: Insufficient documentation

## 2014-08-23 DIAGNOSIS — R0789 Other chest pain: Secondary | ICD-10-CM

## 2014-08-23 DIAGNOSIS — R072 Precordial pain: Secondary | ICD-10-CM | POA: Insufficient documentation

## 2014-08-23 DIAGNOSIS — Z86711 Personal history of pulmonary embolism: Secondary | ICD-10-CM | POA: Diagnosis present

## 2014-08-23 DIAGNOSIS — D696 Thrombocytopenia, unspecified: Secondary | ICD-10-CM | POA: Diagnosis not present

## 2014-08-23 DIAGNOSIS — F17210 Nicotine dependence, cigarettes, uncomplicated: Secondary | ICD-10-CM | POA: Insufficient documentation

## 2014-08-23 DIAGNOSIS — G8929 Other chronic pain: Secondary | ICD-10-CM | POA: Insufficient documentation

## 2014-08-23 HISTORY — DX: Nicotine dependence, unspecified, uncomplicated: F17.200

## 2014-08-23 LAB — BASIC METABOLIC PANEL WITH GFR
Anion gap: 11 (ref 5–15)
BUN: 7 mg/dL (ref 6–23)
CO2: 31 meq/L (ref 19–32)
Calcium: 8.9 mg/dL (ref 8.4–10.5)
Chloride: 96 meq/L (ref 96–112)
Creatinine, Ser: 0.59 mg/dL (ref 0.50–1.10)
GFR calc Af Amer: >90 mL/min
GFR calc non Af Amer: >90 mL/min
Glucose, Bld: 95 mg/dL (ref 70–99)
Potassium: 3.2 meq/L — ABNORMAL LOW (ref 3.7–5.3)
Sodium: 138 meq/L (ref 137–147)

## 2014-08-23 LAB — CBC
HCT: 39.3 % (ref 36.0–46.0)
Hemoglobin: 13.4 g/dL (ref 12.0–15.0)
MCH: 31.8 pg (ref 26.0–34.0)
MCHC: 34.1 g/dL (ref 30.0–36.0)
MCV: 93.1 fL (ref 78.0–100.0)
Platelets: 151 K/uL (ref 150–400)
RBC: 4.22 MIL/uL (ref 3.87–5.11)
RDW: 14.1 % (ref 11.5–15.5)
WBC: 10.4 K/uL (ref 4.0–10.5)

## 2014-08-23 LAB — I-STAT TROPONIN, ED: TROPONIN I, POC: 0 ng/mL (ref 0.00–0.08)

## 2014-08-23 MED ORDER — POTASSIUM CHLORIDE 10 MEQ/100ML IV SOLN
10.0000 meq | Freq: Once | INTRAVENOUS | Status: AC
  Administered 2014-08-23: 10 meq via INTRAVENOUS
  Filled 2014-08-23: qty 100

## 2014-08-23 MED ORDER — OXYCODONE HCL 5 MG PO TABS
30.0000 mg | ORAL_TABLET | Freq: Once | ORAL | Status: AC
  Administered 2014-08-23: 30 mg via ORAL
  Filled 2014-08-23: qty 6

## 2014-08-23 MED ORDER — SODIUM CHLORIDE 0.9 % IV BOLUS (SEPSIS)
1000.0000 mL | Freq: Once | INTRAVENOUS | Status: AC
Start: 2014-08-23 — End: 2014-08-24
  Administered 2014-08-23: 1000 mL via INTRAVENOUS

## 2014-08-23 MED ORDER — ACETAMINOPHEN 325 MG PO TABS
650.0000 mg | ORAL_TABLET | Freq: Once | ORAL | Status: AC
  Administered 2014-08-23: 650 mg via ORAL
  Filled 2014-08-23: qty 2

## 2014-08-23 MED ORDER — ASPIRIN 81 MG PO CHEW
324.0000 mg | CHEWABLE_TABLET | Freq: Once | ORAL | Status: AC
  Administered 2014-08-23: 324 mg via ORAL
  Filled 2014-08-23: qty 4

## 2014-08-23 MED ORDER — IOHEXOL 350 MG/ML SOLN
100.0000 mL | Freq: Once | INTRAVENOUS | Status: AC | PRN
  Administered 2014-08-23: 100 mL via INTRAVENOUS

## 2014-08-23 NOTE — ED Notes (Signed)
Pt. reports mid chest pain onset 2 am this morning worse with exertion , denies SOB , no nausea or diaphoresis .

## 2014-08-23 NOTE — ED Notes (Signed)
Patient C/O flu symptoms for 2 weeks.  States that she woke up with chest pain at 0200.   States that the pain waxed and waned throughout the day.  Patient went to urgent care and was then sent to [facility] ED

## 2014-08-23 NOTE — ED Provider Notes (Signed)
CSN: [id number]     Arrival date & time 08/23/14  1940 History   First MD Initiated Contact with Patient 08/23/14 1956     Chief Complaint  Patient presents with  . Chest Pain     (Consider location/radiation/quality/duration/timing/severity/associated sxs/prior Treatment) HPI  Allison Lee is a 58 y.o. female with past medical history significant for COPD, active daily smoker complaining of acute onset of pleuritic substernal chest pain rated at 8/10,acute onset at 2 am, woke her from sleep and is alleviated by oxycodone which she takes for her chronic neck and low back pain. Patient denies any history of DVT or PE, recent mobilizations or surgeries, calf pain or leg swelling. Reports associated symptoms of exacerbation of chronic neck and back pain. States this is not atypical for her. She's been taking oxycodone 30 mg every 6 hours with relief. Last dose was at 2 PM this afternoon. She took 81 mg aspirin at the same time. She denies cough, fever, chills, abdominal pain, syncope, palpitations exogenous estrogen, nausea, vomiting, shortness of breath, hypertension, hyperlipidemia, diabetes. Patient states she has a family history of CAD with grandfather having heart attack at age 43. Patient states she has been eating and drinking less than normal: she was sick with nausea vomiting and diarrhea 2 weeks ago. Patient denies rhinorrhea, otalgia, she endorses a sore throat which she's had for several days, denies dysuria, hematuria, urinary frequency, diarrhea, rash, headache, cervicalgia change from her baseline.   PCP: [facility]  Past Medical History  Diagnosis Date  . COPD (chronic obstructive pulmonary disease)   . Dysthymia   . Osteoporosis   . IBS (irritable bowel syndrome)   . Other chronic pain   . Arthritis   . GERD (gastroesophageal reflux disease)   . Smoker    Past Surgical History  Procedure Laterality Date  . Cervical spine surgery    . Foot  surgery  2009  . Spine surgery      C-spine   Family History  Problem Relation Age of Onset  . COPD Mother   . Cancer Mother   . Dementia Mother   . Cancer Father   . Cancer Brother   . Cirrhosis Brother    History  Substance Use Topics  . Smoking status: Current Every Day Smoker -- 1.00 packs/day for 35 years    Types: Cigarettes  . Smokeless tobacco: Never Used     Comment: trying to cut back  . Alcohol Use: No   OB History   Grav Para Term Preterm Abortions TAB SAB Ect Mult Living                 Review of Systems  10 systems reviewed and found to be negative, except as noted in the HPI.   Allergies  Methadone; Hydrocodone; Topamax; Hctz; Neurontin; and Pregabalin  Home Medications   Prior to Admission medications   Medication Sig Start Date End Date Taking? Authorizing Provider  bisacodyl (DULCOLAX) 5 MG EC tablet Take 15-20 mg by mouth at bedtime.   Yes Historical Provider, MD  Calcium-Vitamin D 500-100 MG-UNIT WAFR Take 1 each by mouth 2 (two) times daily.    Yes Historical Provider, MD  clonazePAM (KLONOPIN) 1 MG tablet Take 1 mg by mouth 3 (three) times daily.    Yes Historical Provider, MD  cyclobenzaprine (FLEXERIL) 5 MG tablet Take 1 tablet (5 mg total) by mouth 3 (three) times daily. 10/28/12  Yes Terance Ice, MD  hydrOXYzine (ATARAX/VISTARIL) 50 MG tablet Take 50 mg by mouth at bedtime. 05/21/14  Yes Historical Provider, MD  NEXIUM 40 MG capsule Take 40 mg by mouth daily as needed. 06/22/14  Yes Historical Provider, MD  oxycodone (ROXICODONE) 30 MG immediate release tablet Take 30 mg by mouth every 6 (six) hours.   Yes Historical Provider, MD  traZODone (DESYREL) 100 MG tablet Take 100 mg by mouth at bedtime. 08/18/14  Yes Historical Provider, MD   BP 81/54  Pulse 80  Temp(Src) 101 F (38.3 C) (Oral)  Resp 16  SpO2 98% Physical Exam  Nursing note and vitals reviewed. Constitutional: She is oriented to person, place, and time. She appears  well-developed and well-nourished. No distress.  Appears older than stated age  HENT:  Head: Normocephalic.  Mouth/Throat: Oropharynx is clear and moist.  No drooling or stridor. Posterior pharynx mildly erythematous no significant tonsillar hypertrophy. No exudate. Soft palate rises symmetrically. No TTP or induration under tongue.   No tenderness to palpation of frontal or bilateral maxillary sinuses.  No mucosal edema in the nares.  Bilateral tympanic membranes with normal architecture and good light reflex.    Eyes: Conjunctivae and EOM are normal. Pupils are equal, round, and reactive to light.  Neck: Normal range of motion. Neck supple. No JVD present.  Cardiovascular: Regular rhythm and intact distal pulses.   Tachycardic in the 1 teens,   Radial pulses 2+ and equal bilaterally.  Pulmonary/Chest: Effort normal and breath sounds normal. No stridor. No respiratory distress. She has no wheezes. She exhibits no tenderness.  Reduced air movement in all fields, no wheezing  Abdominal: Soft. Bowel sounds are normal. She exhibits no distension and no mass. There is no tenderness. There is no rebound and no guarding.  Musculoskeletal: Normal range of motion. She exhibits no edema and no tenderness.  No deformity, erythema or abrasions. FROM. No effusion or crepitance. Anterior and posterior drawer show no abnormal laxity. Stable to valgus and varus stress. Joint lines are non-tender. Neurovascularly intact. Pt ambulates with non-antalgic gait.    Neurological: She is alert and oriented to person, place, and time.  Psychiatric: She has a normal mood and affect.    ED Course  Procedures (including critical care time) Labs Review Labs Reviewed  BASIC METABOLIC PANEL - Abnormal; Notable for the following:    Potassium 3.2 (*)    All other components within normal limits  CULTURE, BLOOD (ROUTINE X 2)  CULTURE, BLOOD (ROUTINE X 2)  URINE CULTURE  CBC  URINALYSIS, ROUTINE W REFLEX  MICROSCOPIC  I-STAT TROPOININ, ED    Imaging Review Dg Chest 2 View  08/23/2014   CLINICAL DATA:  Mid chest pain since 2 a.m.  EXAM: CHEST  2 VIEW  COMPARISON:  August 05, 2011  FINDINGS: The heart size and mediastinal contours are within normal limits. There is no focal infiltrate, pulmonary edema, or pleural effusion. Patient is status post prior fixation of lower cervical spine. The visualized skeletal structures are unremarkable.  IMPRESSION: No active cardiopulmonary disease.   Electronically Signed   By: Abelardo Diesel M.D.   On: 08/23/2014 20:56   Ct Angio Chest Pe W/cm &/or Wo Cm  08/23/2014   CLINICAL DATA:  58 year female with acute chest pain since 0200 hr and shortness of Breath. Initial encounter.  EXAM: CT ANGIOGRAPHY CHEST WITH CONTRAST  TECHNIQUE: Multidetector CT imaging of the chest was performed using the standard protocol during bolus administration of intravenous contrast. Multiplanar CT image  reconstructions and MIPs were obtained to evaluate the vascular anatomy.  CONTRAST:  134mL OMNIPAQUE IOHEXOL 350 MG/ML SOLN  COMPARISON:  Chest CTA 02/23/2005.  FINDINGS: Good contrast bolus timing in the pulmonary arterial tree. Mild respiratory motion artifact in the lower lobes. Very small and nonocclusive appearing filling defect in right upper lobe pulmonary artery branch (series 47, image 96). No other pulmonary artery filling defect identified.  Lower lung volumes. Dependent atelectasis at both lung bases. Central airways are patent. No acute changes in the right upper lobe. No consolidation.  Lower cervical ACDF hardware. 16 mm low-density left thyroid lobe nodule has increased since 2006, but benign etiology is favored. Negative visualized aorta except for atherosclerosis. No pericardial or pleural effusion. No mediastinal or hilar lymphadenopathy. No axillary lymphadenopathy.  Negative visualized liver, spleen, pancreas, adrenal glands, kidneys, or bowel in the upper abdomen.   Mild scoliosis. Stable benign bone island in T6. No acute osseous abnormality identified.  Review of the MIP images confirms the above findings.  IMPRESSION: 1. Tiny and nonocclusive appearing right upper lobe branch pulmonary embolus (see series 47, image 96). 2. No other acute pulmonary embolus identified. 3. Pulmonary atelectasis. Study discussed by telephone with Dr. Regenia Skeeter on 08/23/2014 at 23:01 .   Electronically Signed   By: Lars Pinks M.D.   On: 08/23/2014 23:02     EKG Interpretation None      MDM   Final diagnoses:  Chest pain    Filed Vitals:   08/23/14 2200 08/23/14 2215 08/24/14 0000 08/24/14 0030  BP:    81/54  Pulse: 102 102 91 80  Temp:      TempSrc:      Resp: 11 13  16   SpO2: 96% 96% 97% 98%    Medications  sodium chloride 0.9 % bolus 1,000 mL (1,000 mLs Intravenous New Bag/Given 08/23/14 2049)  acetaminophen (TYLENOL) tablet 650 mg (650 mg Oral Given 08/23/14 2153)  aspirin chewable tablet 324 mg (324 mg Oral Given 08/23/14 2152)  oxyCODONE (Oxy IR/ROXICODONE) immediate release tablet 30 mg (30 mg Oral Given 08/23/14 2153)  potassium chloride 10 mEq in 100 mL IVPB (10 mEq Intravenous New Bag/Given 08/23/14 2154)  iohexol (OMNIPAQUE) 350 MG/ML injection 100 mL (100 mLs Intravenous Contrast Given 08/23/14 2232)    Allison Lee is a 58 y.o. female presenting with acute onset of pleuritic chest pain at approximately 2 am. Patient was sinus tachycardia on EKG from urgent care. Will obtain EKG here in ED. She's not felt palpitations. Patient is febrile here with temperature of 101. Lung sounds show reduced air movement in all fields. Pt has no cough, I doubt pneumonia, chest x-ray with no infiltrate. Patient saturating well on room air. Atypical for anginal type pain. Patient is postmenopausal and a smoker however she has no other cardiac risk factors.   Patient declines IV pain medication, will give home dose of roxicodone.    8:55 PM explained to tech that we  need EKG ASAP.   EKG shows sinus tach, troponin is negative, no anemia, potassium is slightly low at 3.2. Will need CT to rule out PE.  CT shows tiny nonocclusive right upper lobe PE. It is unclear as to whether this is the source of her pain. Patient does have several cardiac risk factors, will admit to observation. Will discuss anticoagulation with hospitalist.  Is discussed with hospitalist Dr. Ernestina Patches, he is unsure if she warrants admission, I have asked her to evaluate the patient personally and writing notes.  Patient has had several systolic blood pressures in the low 80's. Will be fluid bolused and rechecked.   Discussed with Dr. Ernestina Patches who will admit the patient.  This is a shared visit with the attending physician who personally evaluated the patient and agrees with the care plan.    Monico Blitz, PA-C 08/24/14 [phone]

## 2014-08-24 ENCOUNTER — Encounter (HOSPITAL_COMMUNITY): Payer: Self-pay | Admitting: Cardiology

## 2014-08-24 DIAGNOSIS — R079 Chest pain, unspecified: Secondary | ICD-10-CM

## 2014-08-24 DIAGNOSIS — R0781 Pleurodynia: Secondary | ICD-10-CM | POA: Diagnosis not present

## 2014-08-24 DIAGNOSIS — R072 Precordial pain: Secondary | ICD-10-CM | POA: Diagnosis not present

## 2014-08-24 DIAGNOSIS — J449 Chronic obstructive pulmonary disease, unspecified: Secondary | ICD-10-CM | POA: Diagnosis not present

## 2014-08-24 DIAGNOSIS — E876 Hypokalemia: Secondary | ICD-10-CM | POA: Diagnosis present

## 2014-08-24 DIAGNOSIS — I2699 Other pulmonary embolism without acute cor pulmonale: Secondary | ICD-10-CM | POA: Diagnosis not present

## 2014-08-24 DIAGNOSIS — Z86711 Personal history of pulmonary embolism: Secondary | ICD-10-CM | POA: Diagnosis present

## 2014-08-24 LAB — COMPREHENSIVE METABOLIC PANEL
ALBUMIN: 2.5 g/dL — AB (ref 3.5–5.2)
ALT: 9 U/L (ref 0–35)
ANION GAP: 7 (ref 5–15)
AST: 11 U/L (ref 0–37)
Alkaline Phosphatase: 65 U/L (ref 39–117)
BILIRUBIN TOTAL: 0.5 mg/dL (ref 0.3–1.2)
BUN: 5 mg/dL — AB (ref 6–23)
CHLORIDE: 107 meq/L (ref 96–112)
CO2: 28 mEq/L (ref 19–32)
Calcium: 7.4 mg/dL — ABNORMAL LOW (ref 8.4–10.5)
Creatinine, Ser: 0.54 mg/dL (ref 0.50–1.10)
GFR calc Af Amer: >90 mL/min (ref >90)
GFR calc non Af Amer: >90 mL/min (ref >90)
Glucose, Bld: 89 mg/dL (ref 70–99)
Potassium: 3.3 mEq/L — ABNORMAL LOW (ref 3.7–5.3)
Sodium: 142 mEq/L (ref 137–147)
Total Protein: 4.7 g/dL — ABNORMAL LOW (ref 6.0–8.3)

## 2014-08-24 LAB — TROPONIN I
Troponin I: <0.3 ng/mL (ref <0.30)
Troponin I: <0.3 ng/mL (ref <0.30)

## 2014-08-24 LAB — URINALYSIS, ROUTINE W REFLEX MICROSCOPIC
BILIRUBIN URINE: NEGATIVE
GLUCOSE, UA: NEGATIVE mg/dL
KETONES UR: NEGATIVE mg/dL
Leukocytes, UA: NEGATIVE
Nitrite: NEGATIVE
PH: 6.5 (ref 5.0–8.0)
Protein, ur: NEGATIVE mg/dL
SPECIFIC GRAVITY, URINE: 1.019 (ref 1.005–1.030)
Urobilinogen, UA: 0.2 mg/dL (ref 0.0–1.0)

## 2014-08-24 LAB — CBC WITH DIFFERENTIAL/PLATELET
Basophils Absolute: 0 10*3/uL (ref 0.0–0.1)
Basophils Relative: 0 % (ref 0–1)
Eosinophils Absolute: 0.1 10*3/uL (ref 0.0–0.7)
Eosinophils Relative: 1 % (ref 0–5)
HCT: 34.8 % — ABNORMAL LOW (ref 36.0–46.0)
Hemoglobin: 11.6 g/dL — ABNORMAL LOW (ref 12.0–15.0)
LYMPHS ABS: 2.7 10*3/uL (ref 0.7–4.0)
Lymphocytes Relative: 38 % (ref 12–46)
MCH: 31.4 pg (ref 26.0–34.0)
MCHC: 33.3 g/dL (ref 30.0–36.0)
MCV: 94.1 fL (ref 78.0–100.0)
MONOS PCT: 12 % (ref 3–12)
Monocytes Absolute: 0.8 10*3/uL (ref 0.1–1.0)
NEUTROS PCT: 49 % (ref 43–77)
Neutro Abs: 3.6 10*3/uL (ref 1.7–7.7)
Platelets: 133 10*3/uL — ABNORMAL LOW (ref 150–400)
RBC: 3.7 MIL/uL — ABNORMAL LOW (ref 3.87–5.11)
RDW: 14.3 % (ref 11.5–15.5)
WBC: 7.2 10*3/uL (ref 4.0–10.5)

## 2014-08-24 LAB — TSH: TSH: 0.512 u[IU]/mL (ref 0.350–4.500)

## 2014-08-24 LAB — MAGNESIUM: MAGNESIUM: 1.4 mg/dL — AB (ref 1.5–2.5)

## 2014-08-24 LAB — URINE MICROSCOPIC-ADD ON

## 2014-08-24 LAB — PRO B NATRIURETIC PEPTIDE: Pro B Natriuretic peptide (BNP): 301.8 pg/mL — ABNORMAL HIGH (ref 0–125)

## 2014-08-24 LAB — HEMOGLOBIN A1C
HEMOGLOBIN A1C: 4.8 % (ref <5.7)
MEAN PLASMA GLUCOSE: 91 mg/dL (ref <117)

## 2014-08-24 MED ORDER — POTASSIUM CHLORIDE CRYS ER 20 MEQ PO TBCR
40.0000 meq | EXTENDED_RELEASE_TABLET | Freq: Once | ORAL | Status: AC
  Administered 2014-08-24: 40 meq via ORAL
  Filled 2014-08-24: qty 2

## 2014-08-24 MED ORDER — CLONAZEPAM 1 MG PO TABS
1.0000 mg | ORAL_TABLET | Freq: Three times a day (TID) | ORAL | Status: DC
  Administered 2014-08-24 – 2014-08-25 (×5): 1 mg via ORAL
  Filled 2014-08-24 (×5): qty 1

## 2014-08-24 MED ORDER — ENOXAPARIN SODIUM 60 MG/0.6ML ~~LOC~~ SOLN
60.0000 mg | Freq: Two times a day (BID) | SUBCUTANEOUS | Status: DC
  Administered 2014-08-24 – 2014-08-25 (×3): 60 mg via SUBCUTANEOUS
  Filled 2014-08-24 (×4): qty 0.6

## 2014-08-24 MED ORDER — PANTOPRAZOLE SODIUM 40 MG PO TBEC
80.0000 mg | DELAYED_RELEASE_TABLET | Freq: Every day | ORAL | Status: DC
  Administered 2014-08-24 – 2014-08-25 (×2): 80 mg via ORAL
  Filled 2014-08-24: qty 2

## 2014-08-24 MED ORDER — ACETAMINOPHEN 325 MG PO TABS: 650.0000 mg | ORAL_TABLET | Freq: Four times a day (QID) | ORAL | Status: DC | PRN

## 2014-08-24 MED ORDER — SODIUM CHLORIDE 0.9 % IV BOLUS (SEPSIS)
1000.0000 mL | Freq: Once | INTRAVENOUS | Status: AC
  Administered 2014-08-24: 1000 mL via INTRAVENOUS

## 2014-08-24 MED ORDER — CALCIUM CARBONATE-VITAMIN D 500-200 MG-UNIT PO TABS
1.0000 | ORAL_TABLET | Freq: Two times a day (BID) | ORAL | Status: DC
  Administered 2014-08-24 – 2014-08-25 (×4): 1 via ORAL
  Filled 2014-08-24 (×5): qty 1

## 2014-08-24 MED ORDER — ENOXAPARIN SODIUM 60 MG/0.6ML ~~LOC~~ SOLN
60.0000 mg | Freq: Once | SUBCUTANEOUS | Status: AC
  Administered 2014-08-24: 60 mg via SUBCUTANEOUS
  Filled 2014-08-24: qty 0.6

## 2014-08-24 MED ORDER — NICOTINE 21 MG/24HR TD PT24
21.0000 mg | MEDICATED_PATCH | Freq: Every day | TRANSDERMAL | Status: DC
  Administered 2014-08-24 – 2014-08-25 (×2): 21 mg via TRANSDERMAL
  Filled 2014-08-24 (×2): qty 1

## 2014-08-24 MED ORDER — TRAZODONE HCL 100 MG PO TABS
100.0000 mg | ORAL_TABLET | Freq: Every day | ORAL | Status: DC
  Administered 2014-08-24: 100 mg via ORAL
  Filled 2014-08-24 (×2): qty 1

## 2014-08-24 MED ORDER — SODIUM CHLORIDE 0.9 % IJ SOLN
3.0000 mL | Freq: Two times a day (BID) | INTRAMUSCULAR | Status: DC
  Administered 2014-08-24 – 2014-08-25 (×4): 3 mL via INTRAVENOUS

## 2014-08-24 MED ORDER — OXYCODONE HCL 5 MG PO TABS
30.0000 mg | ORAL_TABLET | Freq: Four times a day (QID) | ORAL | Status: DC
  Administered 2014-08-24 – 2014-08-25 (×8): 30 mg via ORAL
  Filled 2014-08-24 (×8): qty 6

## 2014-08-24 MED ORDER — MAGNESIUM SULFATE 40 MG/ML IJ SOLN
2.0000 g | Freq: Once | INTRAMUSCULAR | Status: AC
  Administered 2014-08-24: 2 g via INTRAVENOUS
  Filled 2014-08-24: qty 50

## 2014-08-24 NOTE — ED Notes (Signed)
Pt went to the bathroom independently and did not collect sample.

## 2014-08-24 NOTE — H&P (Addendum)
Hospitalist Admission History and Physical  Patient name: Allison Lee Medical record number: [id number] Date of birth: 1956/07/28 Age: 58 y.o. Gender: female  Primary Care Provider: Guadlupe Spanish, MD  Chief Complaint: PE, chest pain  History of Present Illness:This is a 58 y.o. year old female with significant past medical history of COPD, tobacco abuse, chronic pain, cervicalgia  presenting with PE, chest pain. Patient states that she woke up yesterday morning with severe central chest pain that was worse with deep breathing. Some radiation into the neck and left shoulder. Patient states that she has chronic neck and shoulder pain in the setting of cervicalgia. Symptoms persist throughout the day. Denies any nausea diaphoresis. Minimal shortness of breath. Baseline history of COPD. Denies any wheezing. Still smoking one pack per day. Presents to the ER MAXIMUM TEMPERATURE 101, heart rate in the 70s to 100s, respirations intense, blood pressure in the 80s to 100s, satting greater than 95% on room air. CBC and be made within normal limits apart from potassium 3.2. Troponin negative x1. EKG with sinus tachycardia. CT [place] shows small nonocclusive PE in the right upper lobe.   Assessment and Plan: Allison Lee is a 58 y.o. year old female presenting with chest pain/PE   Active Problems:   * No active hospital problems. *   1- Chest Pain/PE  -Chest pain likely secondary to PE-pleuritic in nature, though with some typical features and +RFs  -treatment dose lovenox x1-may be transitioned to xarelto at discharge  -cycle CEs  -risk stratification labs  -tele bed  -quit smoking  -noted mild elevated temp on presentation in setting of PE. No overt signs of infection. Pancultured in ER-observe.  2-HTN -LLN BPs on presentation -clinically ry on exam  -hydrate pt -follow    3-Chronic pai/cervicalgia -cont home refimen   FEN/GI: heart healthy diet  Prophylaxis: lovenox   Disposition: pending further evaluation-anticipate early discharge  Code Status:Full Code    Patient Active Problem List   Diagnosis Date Noted  . PE (pulmonary embolism) 08/24/2014  . Medication monitoring encounter 10/21/2012  . Medication management 10/21/2012  . Cervicalgia 02/29/2012  . Lumbago 02/29/2012  . Lumbar pain with radiation down left leg 02/29/2012  . Neuropathic pain, arm 02/29/2012  . Elevated BP 01/05/2012  . Chronic pain disorder 01/05/2012  . GERD (gastroesophageal reflux disease) 01/04/2012  . Depression 01/04/2012  . COPD (chronic obstructive pulmonary disease)   . Osteoporosis   . IBS (irritable bowel syndrome)   . Other chronic pain    Past Medical History: Past Medical History  Diagnosis Date  . COPD (chronic obstructive pulmonary disease)   . Dysthymia   . Osteoporosis   . IBS (irritable bowel syndrome)   . Other chronic pain   . Arthritis   . GERD (gastroesophageal reflux disease)   . Smoker     Past Surgical History: Past Surgical History  Procedure Laterality Date  . Cervical spine surgery    . Foot surgery  2009  . Spine surgery      C-spine    Social History: History   Social History  . Marital Status: Married    Spouse Name: N/A    Number of Children: N/A  . Years of Education: N/A   Social History Main Topics  . Smoking status: Current Every Day Smoker -- 1.00 packs/day for 35 years    Types: Cigarettes  . Smokeless tobacco: Never Used     Comment: trying to cut back  . Alcohol  Use: No  . Drug Use: No  . Sexual Activity: None   Other Topics Concern  . None   Social History Narrative  . None    Family History: Family History  Problem Relation Age of Onset  . COPD Mother   . Cancer Mother   . Dementia Mother   . Cancer Father   . Cancer Brother   . Cirrhosis Brother     Allergies: Allergies  Allergen Reactions  . Methadone Other (See Comments)    Fluid retention  . Hydrocodone Nausea Only  .  Topamax Other (See Comments)    osteoporosis  . Hctz [Hydrochlorothiazide] Rash  . Neurontin [Gabapentin] Swelling  . Pregabalin Other (See Comments)    Dizziness    Current Facility-Administered Medications  Medication Dose Route Frequency Provider Last Rate Last Dose  . sodium chloride 0.9 % bolus 1,000 mL  1,000 mL Intravenous Once [employer], PA-C 1,000 mL/hr at 08/24/14 0118 1,000 mL at 08/24/14 0118  . sodium chloride 0.9 % injection 3 mL  3 mL Intravenous Q12H Shanda Howells, MD       Current Outpatient Prescriptions  Medication Sig Dispense Refill  . bisacodyl (DULCOLAX) 5 MG EC tablet Take 15-20 mg by mouth at bedtime.      . Calcium-Vitamin D 500-100 MG-UNIT WAFR Take 1 each by mouth 2 (two) times daily.       . clonazePAM (KLONOPIN) 1 MG tablet Take 1 mg by mouth 3 (three) times daily.       . cyclobenzaprine (FLEXERIL) 5 MG tablet Take 1 tablet (5 mg total) by mouth 3 (three) times daily.  90 tablet  2  . hydrOXYzine (ATARAX/VISTARIL) 50 MG tablet Take 50 mg by mouth at bedtime.      Marland Kitchen NEXIUM 40 MG capsule Take 40 mg by mouth daily as needed.      Marland Kitchen oxycodone (ROXICODONE) 30 MG immediate release tablet Take 30 mg by mouth every 6 (six) hours.      . traZODone (DESYREL) 100 MG tablet Take 100 mg by mouth at bedtime.       Review Of Systems: 12 point ROS negative except as noted above in HPI.  Physical Exam: Filed Vitals:   08/24/14 0116  BP: 96/64  Pulse: 78  Temp: 97.7 F (36.5 C)  Resp: 16    General: alert, cooperative and underweight HEENT: PERRLA, extra ocular movement intact and dry oral mucosa Heart: S1, S2 normal, no murmur, rub or gallop, regular rate and rhythm Lungs: clear to auscultation, no wheezes or rales and unlabored breathing Abdomen: abdomen is soft without significant tenderness, masses, organomegaly or guarding Extremities: extremities normal, atraumatic, no cyanosis or edema Skin:no rashes, no ecchymoses Neurology: normal without focal  findings  Labs and Imaging: Lab Results  Component Value Date/Time   NA 138 08/23/2014  7:46 PM   K 3.2* 08/23/2014  7:46 PM   CL 96 08/23/2014  7:46 PM   CO2 31 08/23/2014  7:46 PM   BUN 7 08/23/2014  7:46 PM   BUN 5 11/05/2008   CREATININE 0.59 08/23/2014  7:46 PM   GLUCOSE 95 08/23/2014  7:46 PM   Lab Results  Component Value Date   WBC 10.4 08/23/2014   HGB 13.4 08/23/2014   HCT 39.3 08/23/2014   MCV 93.1 08/23/2014   PLT 151 08/23/2014    Dg Chest 2 View  08/23/2014   CLINICAL DATA:  Mid chest pain since 2 a.m.  EXAM: CHEST  2  VIEW  COMPARISON:  August 05, 2011  FINDINGS: The heart size and mediastinal contours are within normal limits. There is no focal infiltrate, pulmonary edema, or pleural effusion. Patient is status post prior fixation of lower cervical spine. The visualized skeletal structures are unremarkable.  IMPRESSION: No active cardiopulmonary disease.   Electronically Signed   By: Abelardo Diesel M.D.   On: 08/23/2014 20:56   Ct Angio Chest Pe W/cm &/or Wo Cm  08/23/2014   CLINICAL DATA:  58 year female with acute chest pain since 0200 hr and shortness of Breath. Initial encounter.  EXAM: CT ANGIOGRAPHY CHEST WITH CONTRAST  TECHNIQUE: Multidetector CT imaging of the chest was performed using the standard protocol during bolus administration of intravenous contrast. Multiplanar CT image reconstructions and MIPs were obtained to evaluate the vascular anatomy.  CONTRAST:  161mL OMNIPAQUE IOHEXOL 350 MG/ML SOLN  COMPARISON:  Chest CTA 02/23/2005.  FINDINGS: Good contrast bolus timing in the pulmonary arterial tree. Mild respiratory motion artifact in the lower lobes. Very small and nonocclusive appearing filling defect in right upper lobe pulmonary artery branch (series 47, image 96). No other pulmonary artery filling defect identified.  Lower lung volumes. Dependent atelectasis at both lung bases. Central airways are patent. No acute changes in the right upper lobe. No  consolidation.  Lower cervical ACDF hardware. 16 mm low-density left thyroid lobe nodule has increased since 2006, but benign etiology is favored. Negative visualized aorta except for atherosclerosis. No pericardial or pleural effusion. No mediastinal or hilar lymphadenopathy. No axillary lymphadenopathy.  Negative visualized liver, spleen, pancreas, adrenal glands, kidneys, or bowel in the upper abdomen.  Mild scoliosis. Stable benign bone island in T6. No acute osseous abnormality identified.  Review of the MIP images confirms the above findings.  IMPRESSION: 1. Tiny and nonocclusive appearing right upper lobe branch pulmonary embolus (see series 47, image 96). 2. No other acute pulmonary embolus identified. 3. Pulmonary atelectasis. Study discussed by telephone with Dr. Regenia Skeeter on 08/23/2014 at 23:01 .   Electronically Signed   By: Lars Pinks M.D.   On: 08/23/2014 23:02           Shanda Howells MD  Pager: [phone]

## 2014-08-24 NOTE — ED Provider Notes (Signed)
Medical screening examination/treatment/procedure(s) were conducted as a shared visit with non-physician practitioner(s) and myself.  I personally evaluated the patient during the encounter.  L sided chest pain radiating to neck and back since 2 am. CTAB, RRR Tiny PE on CT does not seem to explain patient's pain as there is concern for angina as she describes several month history of central chest pain with exertion.   EKG Interpretation None       Ezequiel Essex, MD 08/24/14 [phone]

## 2014-08-24 NOTE — Care Management Note (Addendum)
    Page 1 of 1   08/24/2014     2:58:05 PM CARE MANAGEMENT NOTE 08/24/2014  Patient:  Allison Lee, Allison Lee   Account Number:  [id number]  Date Initiated:  08/24/2014  Documentation initiated by:  Barkley Kratochvil  Subjective/Objective Assessment:   Pt adm on 08/23/14 with PE.  PTA, pt independent of ADLS.     Action/Plan:   CM referral for Xarelto.   Anticipated DC Date:  08/25/2014   Anticipated DC Plan:  [place]  CM consult      Choice offered to / List presented to:             Status of service:  Completed, signed off Medicare Important Message given?   (If response is "NO", the following Medicare IM given date fields will be blank) Date Medicare IM given:   Medicare IM given by:   Date Additional Medicare IM given:   Additional Medicare IM given by:    Discharge Disposition:  HOME/SELF CARE  Per UR Regulation:  Reviewed for med. necessity/level of care/duration of stay  If discussed at [place] of Stay Meetings, dates discussed:    Comments:  08/24/14 Ellan Lambert, RN, BSN [phone]  S/WPAMELA @[facility]  [facility] # [phone] XARLETO  15 MG BID X 21 DAYS COVER-YES CO-PAY $ 30.00  30 DAY SUPPLY  60 PILLS TIER-3 DRUG PRIOR APPROVAL -NO PHARMACY : [facility], [facility], [facility]  XARELTO 20 MG  DAILY 30 DAYS CO-PAY ALSO $ 30.00   Pt provided with 30 day free trial card for Xarelto.

## 2014-08-24 NOTE — Progress Notes (Signed)
UR completed 

## 2014-08-24 NOTE — Consult Note (Signed)
CARDIOLOGY CONSULT NOTE  Patient ID: Allison Lee MRN: [id number] DOB/AGE: Mar 21, 1956 58 y.o.  Admit date: 08/23/2014 Primary Physician Guadlupe Spanish, MD Primary Cardiologist None Chief Complaint  Chest pain  HPI:  The patient has no prior cardiac history. She does have risk factors smoking. She reports that she had a diarrheal illness earlier this month. He then developed some flulike symptoms. She did spend  3 or 4 days in bed.  She presented with chest discomfort and woke her from her sleep. She has not had pain like this before. As 9/10 in intensity. It was worse taking a deep breath. There was some radiation to her left arm. It seemed to be more sharp. She did not have associated nausea vomiting or diaphoresis. She didn't notice any palpitations, presyncope or syncope. She went to urgent care. She was sent him to [facility] where she was noted to have a slightly elevated BNP but negative enzymes. Chest CT did demonstrate a very small pulmonary embolism.  Past Medical History  Diagnosis Date  . COPD (chronic obstructive pulmonary disease)   . Dysthymia   . Osteoporosis   . IBS (irritable bowel syndrome)   . Other chronic pain   . Arthritis   . GERD (gastroesophageal reflux disease)   . Smoker     Past Surgical History  Procedure Laterality Date  . Cervical spine surgery      x 2  . Foot surgery  2009    Allergies  Allergen Reactions  . Methadone Other (See Comments)    Fluid retention  . Hydrocodone Nausea Only  . Topamax Other (See Comments)    osteoporosis  . Hctz [Hydrochlorothiazide] Rash  . Neurontin [Gabapentin] Swelling  . Pregabalin Other (See Comments)    Dizziness   Prescriptions prior to admission  Medication Sig Dispense Refill  . bisacodyl (DULCOLAX) 5 MG EC tablet Take 15-20 mg by mouth at bedtime.      . Calcium-Vitamin D 500-100 MG-UNIT WAFR Take 1 each by mouth 2 (two) times daily.       . clonazePAM (KLONOPIN) 1 MG tablet Take 1 mg by mouth 3  (three) times daily.       . cyclobenzaprine (FLEXERIL) 5 MG tablet Take 1 tablet (5 mg total) by mouth 3 (three) times daily.  90 tablet  2  . hydrOXYzine (ATARAX/VISTARIL) 50 MG tablet Take 50 mg by mouth at bedtime.      Marland Kitchen NEXIUM 40 MG capsule Take 40 mg by mouth daily as needed.      Marland Kitchen oxycodone (ROXICODONE) 30 MG immediate release tablet Take 30 mg by mouth every 6 (six) hours.      . traZODone (DESYREL) 100 MG tablet Take 100 mg by mouth at bedtime.       Family History  Problem Relation Age of Onset  . COPD Mother   . Cancer Mother   . Dementia Mother   . Cancer Father   . Cancer Brother   . Cirrhosis Brother   . CAD Father 26    History   Social History  . Marital Status: Married    Spouse Name: N/A    Number of Children: 2  . Years of Education: N/A   Occupational History  . DISABLED    Social History Main Topics  . Smoking status: Current Every Day Smoker -- 1.00 packs/day for 35 years    Types: Cigarettes  . Smokeless tobacco: Never Used     Comment: trying to cut  back  . Alcohol Use: No  . Drug Use: No  . Sexual Activity: Not on file   Other Topics Concern  . Not on file   Social History Narrative   Lives at home with husband     ROS:  Back and neck pain, with abnormal gait.   As stated in the HPI and negative for all other systems.  Physical Exam: Blood pressure 109/73, pulse 85, temperature 98.6 F (37 C), temperature source Oral, resp. rate 18, height 5\' 4"  (1.626 m), weight 130 lb 8.2 oz (59.2 kg), SpO2 98.00%.  GENERAL:  Well appearing HEENT:  Pupils equal round and reactive, fundi not visualized, oral mucosa unremarkable NECK:  No jugular venous distention, waveform within normal limits, carotid upstroke brisk and symmetric, no bruits, no thyromegaly LYMPHATICS:  No cervical, inguinal adenopathy LUNGS:  Clear to auscultation bilaterally BACK:  No CVA tenderness CHEST:  Unremarkable HEART:  PMI not displaced or sustained,S1 and S2 within normal  limits, no S3, no S4, no clicks, no rubs, no murmurs ABD:  Flat, positive bowel sounds normal in frequency in pitch, no bruits, no rebound, no guarding, no midline pulsatile mass, no hepatomegaly, no splenomegaly EXT:  2 plus pulses throughout, no edema, no cyanosis no clubbing SKIN:  No rashes no nodules NEURO:  Cranial nerves II through XII grossly intact, motor grossly intact throughout PSYCH:  Cognitively intact, oriented to person place and time   Labs: Lab Results  Component Value Date   BUN 5* 08/24/2014   Lab Results  Component Value Date   CREATININE 0.54 08/24/2014   Lab Results  Component Value Date   NA 142 08/24/2014   K 3.3* 08/24/2014   CL 107 08/24/2014   CO2 28 08/24/2014   Lab Results  Component Value Date   TROPONINI <0.30 08/24/2014   Lab Results  Component Value Date   WBC 7.2 08/24/2014   HGB 11.6* 08/24/2014   HCT 34.8* 08/24/2014   MCV 94.1 08/24/2014   PLT 133* 08/24/2014    Lab Results  Component Value Date   ALT 9 08/24/2014   AST 11 08/24/2014   ALKPHOS 65 08/24/2014   BILITOT 0.5 08/24/2014     Radiology:   CXR: The heart size and mediastinal contours are within normal limits.  There is no focal infiltrate, pulmonary edema, or pleural effusion.  Patient is status post prior fixation of lower cervical spine. The  visualized skeletal structures are unremarkable.   CT:  1. Tiny and nonocclusive appearing right upper lobe branch pulmonary  embolus (see series 47, image 96).  2. No other acute pulmonary embolus identified.  3. Pulmonary atelectasis.    EKG:NSR, rate 89, axis WNL intervals WNL, very subtle diffuse ST elevation nondiagnostic  08/24/2014   ASSESSMENT AND PLAN:   CHEST PAIN:  Chest pain is quite atypical and seems to be pleuritic. There is no objective evidence of ischemia. At this point I would like to order an echocardiogram. This is to look for any evidence of pericardial effusion or wall motion abnormalities.There  is some subtle ST change on her EKG and pericarditis is a possibility. If this is normal no further inpatient workup is indicated. She can be treated for her pulmonary embolism and pleuritic pain. Given her risk factors I would plan stress testing as an outpatient although it would be difficult for her to walk on a treadmill.  PULMONARY EMBOLISM:  Agree with plan for NOAC per primary team.    HTN: The  blood pressure is at target. No change in medications is indicated. We will continue with therapeutic lifestyle changes (TLC).  SignedMinus Breeding 08/24/2014, 3:29 PM

## 2014-08-24 NOTE — ED Notes (Signed)
Attempted report 

## 2014-08-24 NOTE — Progress Notes (Signed)
PROGRESS NOTE    Allison Lee YHC:[id number] DOB: [birth year] DOA: 08/23/2014 PCP: Guadlupe Spanish, MD  HPI/Brief narrative 58 year old female patient with history of COPD, ongoing tobacco abuse, chronic cervical pain with radiation to left shoulder & LUE, chronic low back pain with radiation to LLE, hypertension who woke up at a.m. on 08/23/14 with sharp midsternal chest pain radiating to the left side of neck and upper extremity. She thought that her neck pain and left upper extremity pain were secondary to chronic cervical pain. However over the next few hours chest pain got worse-pleuritic in nature and presented to the ED. She denies cough or dyspnea. She denies recent long-distance travel. She does have history of exertional midsternal chest pain radiating to left side of neck and left upper extremity for the last 2-3 months, relieved by rest. Denies prior history of MI/CAD, cardiac cath or stress test. Family history positive for CAD-MI in dad at age 88. In the ED, CT chest confirmed tiny and nonocclusive appearing right upper lobe branch pulmonary embolism-not sure this is the cause of her chest pain.  Assessment/Plan:  1. Chest pain: Has some pleuritic componant during the last episode but does have pain suspicious for angina ongoing for the last 2-3 months. Not sure if the tiny pulmonary embolism will explain all her chest pain symptoms. Risk factors for CAD-tobacco abuse, hypertension and family history. Ruled out for MI by negative troponins x2. Cardiology consulted for assistance with management. 2. Acute pulmonary embolism: Tiny nonocclusive appearing right upper lobe branch pulmonary embolism. Currently on full dose Lovenox. Will switch to a NOAC if no cardiac workup planned. This appears to be a non-provoked pulmonary embolism-recommend outpatient workup for prothrombotic state. 3. Tobacco abuse: Cessation counseled. Patient requests nicotine patch. 30 pack year smoking  history 4. Essential hypertension: Soft blood pressures. Not on antihypertensives. Monitor 5. Chronic neck and low back pain with radicular symptoms: Outpatient follow up 6. History of panic attacks and depression: Follows with psychiatry/Dr. Laren Everts OP 7. Hypokalemia/hypomagnesemia: Replace and follow. 8. Anemia & mild thrombocytopenia: FU CBC. 9. Fever: Had temperature of 10 19F in ED without symptoms suggestive of source. Chest x-ray and CTA chest without pneumonia. UA negative. Monitor off antibiotics. 10. COPD stable  Code Status: Full Family Communication: None at bedside Disposition Plan: Home when medically stable   Consultants:  Cardiology  Procedures:  None  Antibiotics:  None   Subjective: Denies chest pain this morning.  Objective: Filed Vitals:   08/24/14 0200 08/24/14 0215 08/24/14 0308 08/24/14 0500  BP: 111/73  98/66 97/65  Pulse: 81 93 93 79  Temp:   97.4 F (36.3 C) 98.3 F (36.8 C)  TempSrc:   Oral Oral  Resp:   18 14  Height:   5\' 4"  (1.626 m)   Weight:   59.2 kg (130 lb 8.2 oz)   SpO2: 97% 96% 96% 96%    Intake/Output Summary (Last 24 hours) at 08/24/14 1140 Last data filed at 08/24/14 0900  Gross per 24 hour  Intake   3123 ml  Output    750 ml  Net   2373 ml   Filed Weights   08/24/14 0130 08/24/14 0308  Weight: 56.246 kg (124 lb) 59.2 kg (130 lb 8.2 oz)     Exam:  General exam: Pleasant middle-aged female sitting up comfortably in bed. Respiratory system: Clear. No increased work of breathing. No reproducible chest wall tenderness. Cardiovascular system: S1 & S2 heard, RRR. No JVD, murmurs, gallops, clicks  or pedal edema. Telemetry: Sinus rhythm. Gastrointestinal system: Abdomen is nondistended, soft and nontender. Normal bowel sounds heard. Central nervous system: Alert and oriented. No focal neurological deficits. Extremities: Symmetric 5 x 5 power.   Data Reviewed: Basic Metabolic Panel:  Recent Labs Lab 08/23/14 1946  08/24/14 0404 08/24/14 0725  NA 138 142  --   K 3.2* 3.3*  --   CL 96 107  --   CO2 31 28  --   GLUCOSE 95 89  --   BUN 7 5*  --   CREATININE 0.59 0.54  --   CALCIUM 8.9 7.4*  --   MG  --   --  1.4*   Liver Function Tests:  Recent Labs Lab 08/24/14 0404  AST 11  ALT 9  ALKPHOS 65  BILITOT 0.5  PROT 4.7*  ALBUMIN 2.5*   No results found for this basename: LIPASE, AMYLASE,  in the last 168 hours No results found for this basename: AMMONIA,  in the last 168 hours CBC:  Recent Labs Lab 08/23/14 1946 08/24/14 0404  WBC 10.4 7.2  NEUTROABS  --  3.6  HGB 13.4 11.6*  HCT 39.3 34.8*  MCV 93.1 94.1  PLT 151 133*   Cardiac Enzymes:  Recent Labs Lab 08/24/14 0145 08/24/14 0725  TROPONINI <0.30 <0.30   BNP (last 3 results)  Recent Labs  08/24/14 0145  PROBNP 301.8*   CBG: No results found for this basename: GLUCAP,  in the last 168 hours  No results found for this or any previous visit (from the past 240 hour(s)).      Studies: Dg Chest 2 View  08/23/2014   CLINICAL DATA:  Mid chest pain since 2 a.m.  EXAM: CHEST  2 VIEW  COMPARISON:  August 05, 2011  FINDINGS: The heart size and mediastinal contours are within normal limits. There is no focal infiltrate, pulmonary edema, or pleural effusion. Patient is status post prior fixation of lower cervical spine. The visualized skeletal structures are unremarkable.  IMPRESSION: No active cardiopulmonary disease.   Electronically Signed   By: Abelardo Diesel M.D.   On: 08/23/2014 20:56   Ct Angio Chest Pe W/cm &/or Wo Cm  08/23/2014   CLINICAL DATA:  58 year female with acute chest pain since 0200 hr and shortness of Breath. Initial encounter.  EXAM: CT ANGIOGRAPHY CHEST WITH CONTRAST  TECHNIQUE: Multidetector CT imaging of the chest was performed using the standard protocol during bolus administration of intravenous contrast. Multiplanar CT image reconstructions and MIPs were obtained to evaluate the vascular  anatomy.  CONTRAST:  157mL OMNIPAQUE IOHEXOL 350 MG/ML SOLN  COMPARISON:  Chest CTA 02/23/2005.  FINDINGS: Good contrast bolus timing in the pulmonary arterial tree. Mild respiratory motion artifact in the lower lobes. Very small and nonocclusive appearing filling defect in right upper lobe pulmonary artery branch (series 47, image 96). No other pulmonary artery filling defect identified.  Lower lung volumes. Dependent atelectasis at both lung bases. Central airways are patent. No acute changes in the right upper lobe. No consolidation.  Lower cervical ACDF hardware. 16 mm low-density left thyroid lobe nodule has increased since 2006, but benign etiology is favored. Negative visualized aorta except for atherosclerosis. No pericardial or pleural effusion. No mediastinal or hilar lymphadenopathy. No axillary lymphadenopathy.  Negative visualized liver, spleen, pancreas, adrenal glands, kidneys, or bowel in the upper abdomen.  Mild scoliosis. Stable benign bone island in T6. No acute osseous abnormality identified.  Review of the MIP images confirms  the above findings.  IMPRESSION: 1. Tiny and nonocclusive appearing right upper lobe branch pulmonary embolus (see series 47, image 96). 2. No other acute pulmonary embolus identified. 3. Pulmonary atelectasis. Study discussed by telephone with Dr. Regenia Skeeter on 08/23/2014 at 23:01 .   Electronically Signed   By: Lars Pinks M.D.   On: 08/23/2014 23:02        Scheduled Meds: . calcium-vitamin D  1 tablet Oral BID WC  . clonazePAM  1 mg Oral TID  . enoxaparin (LOVENOX) injection  60 mg Subcutaneous Q12H  . magnesium sulfate 1 - 4 g bolus IVPB  2 g Intravenous Once  . oxycodone  30 mg Oral Q6H  . pantoprazole  80 mg Oral Q1200  . sodium chloride  3 mL Intravenous Q12H  . traZODone  100 mg Oral QHS   Continuous Infusions:   Active Problems:   * No active hospital problems. *    Time spent: 26 minutes    Reve Crocket, MD, FACP, FHM. [facility] Pager [phone]  If 7PM-7AM, please contact night-coverage [email or web address] Password [email or web address] 08/24/2014, 11:40 AM    LOS: 1 day

## 2014-08-24 NOTE — Progress Notes (Signed)
ANTICOAGULATION CONSULT NOTE - Initial Consult  Pharmacy Consult for Lovenox Indication: pulmonary embolus  Allergies  Allergen Reactions  . Methadone Other (See Comments)    Fluid retention  . Hydrocodone Nausea Only  . Topamax Other (See Comments)    osteoporosis  . Hctz [Hydrochlorothiazide] Rash  . Neurontin [Gabapentin] Swelling  . Pregabalin Other (See Comments)    Dizziness    Patient Measurements: Height: 5' 6.14" (168 cm) Weight: 124 lb (56.246 kg) IBW/kg (Calculated) : 59.63  Vital Signs: Temp: 97.7 F (36.5 C) (10/23 0116) Temp Source: Oral (10/23 0116) BP: 106/68 mmHg (10/23 0130) Pulse Rate: 75 (10/23 0130)  Labs:  Recent Labs  08/23/14 1946  HGB 13.4  HCT 39.3  PLT 151  CREATININE 0.59    Estimated Creatinine Clearance: 70.5 ml/min (by C-G formula based on Cr of 0.59).   Medical History: Past Medical History  Diagnosis Date  . COPD (chronic obstructive pulmonary disease)   . Dysthymia   . Osteoporosis   . IBS (irritable bowel syndrome)   . Other chronic pain   . Arthritis   . GERD (gastroesophageal reflux disease)   . Smoker     Assessment: 58yo female c/o acute onset of pleuritic substernal CP, relieved w/ oxycodone, CXR negative but CT reveals tiny nonocclusive PE, to begin anticoagulation.  Goal of Therapy:  Anti-Xa level 0.6-1 units/ml 4hrs after LMWH dose given Monitor platelets by anticoagulation protocol: Yes   Plan:  Will begin Lovenox 60mg  SQ Q12H and monitor CBC.; will f/u regarding plan for long-term anticoag.  Wynona Neat, PharmD, BCPS  08/24/2014,1:45 AM

## 2014-08-24 NOTE — Progress Notes (Signed)
Chaplain responded to spiritual care consult. Chaplain acquired advanced directive packet for pt, informed her of the contents of the packet, and let her know to notify the nurse when she has completed the documents and/or if she has any questions about its contents.    08/24/14 0932  Clinical Encounter Type  Visited With Patient  Visit Type Initial  Referral From Nurse  Consult/Referral To Chaplain  Advance Directives (For Healthcare)  Does patient have an advance directive? No  Would patient like information on creating an advanced directive? Yes - Educational materials given    Vanetta Mulders 08/24/2014 9:34 AM

## 2014-08-25 DIAGNOSIS — E876 Hypokalemia: Secondary | ICD-10-CM

## 2014-08-25 DIAGNOSIS — I369 Nonrheumatic tricuspid valve disorder, unspecified: Secondary | ICD-10-CM

## 2014-08-25 DIAGNOSIS — R0789 Other chest pain: Secondary | ICD-10-CM

## 2014-08-25 LAB — CBC WITH DIFFERENTIAL/PLATELET
Basophils Absolute: 0 10*3/uL (ref 0.0–0.1)
Basophils Relative: 0 % (ref 0–1)
Eosinophils Absolute: 0.1 10*3/uL (ref 0.0–0.7)
Eosinophils Relative: 2 % (ref 0–5)
HEMATOCRIT: 38.7 % (ref 36.0–46.0)
Hemoglobin: 12.8 g/dL (ref 12.0–15.0)
LYMPHS ABS: 2.1 10*3/uL (ref 0.7–4.0)
LYMPHS PCT: 32 % (ref 12–46)
MCH: 31.3 pg (ref 26.0–34.0)
MCHC: 33.1 g/dL (ref 30.0–36.0)
MCV: 94.6 fL (ref 78.0–100.0)
MONO ABS: 0.6 10*3/uL (ref 0.1–1.0)
MONOS PCT: 9 % (ref 3–12)
Neutro Abs: 3.7 10*3/uL (ref 1.7–7.7)
Neutrophils Relative %: 57 % (ref 43–77)
Platelets: 157 10*3/uL (ref 150–400)
RBC: 4.09 MIL/uL (ref 3.87–5.11)
RDW: 14.3 % (ref 11.5–15.5)
WBC: 6.5 10*3/uL (ref 4.0–10.5)

## 2014-08-25 LAB — BASIC METABOLIC PANEL
Anion gap: 8 (ref 5–15)
BUN: 7 mg/dL (ref 6–23)
CO2: 30 meq/L (ref 19–32)
CREATININE: 0.68 mg/dL (ref 0.50–1.10)
Calcium: 8.9 mg/dL (ref 8.4–10.5)
Chloride: 103 mEq/L (ref 96–112)
GFR calc Af Amer: >90 mL/min (ref >90)
GFR calc non Af Amer: >90 mL/min (ref >90)
GLUCOSE: 91 mg/dL (ref 70–99)
Potassium: 3.9 mEq/L (ref 3.7–5.3)
Sodium: 141 mEq/L (ref 137–147)

## 2014-08-25 LAB — URINE CULTURE
Colony Count: NO GROWTH
Culture: NO GROWTH

## 2014-08-25 LAB — MAGNESIUM: Magnesium: 1.6 mg/dL (ref 1.5–2.5)

## 2014-08-25 MED ORDER — RIVAROXABAN 20 MG PO TABS
20.0000 mg | ORAL_TABLET | Freq: Every day | ORAL | Status: DC
Start: 2014-09-16 — End: 2016-06-23

## 2014-08-25 MED ORDER — RIVAROXABAN 15 MG PO TABS: 15.0000 mg | ORAL_TABLET | Freq: Two times a day (BID) | ORAL | Status: DC

## 2014-08-25 MED ORDER — NICOTINE 21 MG/24HR TD PT24: 21.0000 mg | MEDICATED_PATCH | Freq: Every day | TRANSDERMAL | Status: DC

## 2014-08-25 MED ORDER — RIVAROXABAN 20 MG PO TABS: 20.0000 mg | ORAL_TABLET | Freq: Every day | ORAL | Status: DC

## 2014-08-25 MED ORDER — RIVAROXABAN 15 MG PO TABS
15.0000 mg | ORAL_TABLET | Freq: Two times a day (BID) | ORAL | Status: DC
  Administered 2014-08-25: 15 mg via ORAL
  Filled 2014-08-25 (×2): qty 1

## 2014-08-25 MED ORDER — RIVAROXABAN 15 MG PO TABS
15.0000 mg | ORAL_TABLET | Freq: Two times a day (BID) | ORAL | Status: DC
  Filled 2014-08-25 (×2): qty 1

## 2014-08-25 NOTE — Progress Notes (Signed)
UR completed 

## 2014-08-25 NOTE — Progress Notes (Signed)
Subjective:  Pleuritic chest pain is now resolved. Echo is pending from today  Objective:  Vital Signs in the last 24 hours: BP 129/79  Pulse 104  Temp(Src) 98.7 F (37.1 C) (Oral)  Resp 16  Ht 5\' 4"  (1.626 m)  Wt 57.9 kg (127 lb 10.3 oz)  BMI 21.90 kg/m2  SpO2 91%  Physical Exam: Pleasant female in no acute distress Lungs:  Clear  Cardiac:  Regular rhythm, normal S1 and S2, no S3 Extremities:  No edema present  Intake/Output from previous day: 10/23 0701 - 10/24 0700 In: 480 [P.O.:480] Out: 1400 [Urine:1400] Weight Filed Weights   08/24/14 0130 08/24/14 0308 08/25/14 0531  Weight: 56.246 kg (124 lb) 59.2 kg (130 lb 8.2 oz) 57.9 kg (127 lb 10.3 oz)    Lab Results: Basic Metabolic Panel:  Recent Labs  08/24/14 0404 08/25/14 0240  NA 142 141  K 3.3* 3.9  CL 107 103  CO2 28 30  GLUCOSE 89 91  BUN 5* 7  CREATININE 0.54 0.68    CBC:  Recent Labs  08/24/14 0404 08/25/14 0240  WBC 7.2 6.5  NEUTROABS 3.6 3.7  HGB 11.6* 12.8  HCT 34.8* 38.7  MCV 94.1 94.6  PLT 133* 157    BNP    Component Value Date/Time   PROBNP 301.8* 08/24/2014 0145   Telemetry: Sinus rhythm  Assessment/Plan:  1. Chest pain with atypical features with a small pulmonary embolus previously documented. Await echocardiogram but doubt that this is of any clinical significance. Continue to treat for pulmonary embolus. When over pulmonary embolus consider outpatient stress test   W. Doristine Church  MD [facility]  08/25/2014, 10:37 AM

## 2014-08-25 NOTE — Progress Notes (Signed)
  Echocardiogram 2D Echocardiogram has been performed.  Mauricio Po 08/25/2014, 8:47 AM

## 2014-08-25 NOTE — Discharge Instructions (Addendum)
Information on my medicine - XARELTO (rivaroxaban)  This medication education was reviewed with me or my healthcare representative as part of my discharge preparation.  The pharmacist that spoke with me during my hospital stay was:  Seay, Alex Gardener, [place]? Xarelto was prescribed to treat blood clots that may have been found in the veins of your legs (deep vein thrombosis) or in your lungs (pulmonary embolism) and to reduce the risk of them occurring again.  What do you need to know about Xarelto? The starting dose is one 15 mg tablet taken TWICE daily with food for the FIRST 21 DAYS then on (enter date)  11/15  the dose is changed to one 20 mg tablet taken ONCE A DAY with your evening meal.  DO NOT stop taking Xarelto without talking to the health care provider who prescribed the medication.  Refill your prescription for 20 mg tablets before you run out.  After discharge, you should have regular check-up appointments with your healthcare provider that is prescribing your Xarelto.  In the future your dose may need to be changed if your kidney function changes by a significant amount.  What do you do if you miss a dose? If you are taking Xarelto TWICE DAILY and you miss a dose, take it as soon as you remember. You may take two 15 mg tablets (total 30 mg) at the same time then resume your regularly scheduled 15 mg twice daily the next day.  If you are taking Xarelto ONCE DAILY and you miss a dose, take it as soon as you remember on the same day then continue your regularly scheduled once daily regimen the next day. Do not take two doses of Xarelto at the same time.   Important Safety Information Xarelto is a blood thinner medicine that can cause bleeding. You should call your healthcare provider right away if you experience any of the following:   Bleeding from an injury or your nose that does not stop.   Unusual colored urine (red or dark brown) or  unusual colored stools (red or black).   Unusual bruising for unknown reasons.   A serious fall or if you hit your head (even if there is no bleeding).  Some medicines may interact with Xarelto and might increase your risk of bleeding while on Xarelto. To help avoid this, consult your healthcare provider or pharmacist prior to using any new prescription or non-prescription medications, including herbals, vitamins, non-steroidal anti-inflammatory drugs (NSAIDs) and supplements.  This website has more information on Xarelto: [email or web address] on my medicine - XARELTO (rivaroxaban)  Pulmonary Embolism A pulmonary (lung) embolism (PE) is a blood clot that has traveled to the lung and results in a blockage of blood flow in the affected lung. Most clots come from deep veins in the legs or pelvis. PE is a dangerous and potentially life-threatening condition that can be treated if identified. CAUSES Blood clots form in a vein for different reasons. Usually several things cause blood clots. They include:  The flow of blood slows down.  The inside of the vein is damaged in some way.  The person has a condition that makes the blood clot more easily. RISK FACTORS Some people are more likely than others to develop PE. Risk factors include:   Smoking.  Being overweight (obese).  Sitting or lying still for a long time. This includes long-distance travel, paralysis, or recovery from an illness or surgery. Other factors that increase risk  are:   Older age, especially over 66 years of age.  Having a family history of blood clots or if you have already had a blood clot.  Having major or lengthy surgery. This is especially true for surgery on the hip, knee, or belly (abdomen). Hip surgery is particularly high risk.  Having a long, thin tube (catheter) placed inside a vein during a medical procedure.  Breaking a hip or leg.  Having cancer or cancer treatment.  Medicines containing  the female hormone estrogen. This includes birth control pills and hormone replacement therapy.  Other circulation or heart problems.  Pregnancy and childbirth.  Hormone changes make the blood clot more easily during pregnancy.  The fetus puts pressure on the veins of the pelvis.  There is a risk of injury to veins during delivery or a caesarean delivery. The risk is highest just after childbirth.  PREVENTION   Exercise the legs regularly. Take a brisk 30 minute walk every day.  Maintain a weight that is appropriate for your height.  Avoid sitting or lying in bed for long periods of time without moving your legs.  Women, particularly those over the age of 31 years, should consider the risks and benefits of taking estrogen medicines, including birth control pills.  Do not smoke, especially if you take estrogen medicines.  Long-distance travel can increase your risk. You should exercise your legs by walking or pumping the muscles every hour.  Many of the risk factors above relate to situations that exist with hospitalization, either for illness, injury, or elective surgery. Prevention may include medical and nonmedical measures.   Your health care provider will assess you for the need for venous thromboembolism prevention when you are admitted to the hospital. If you are having surgery, your surgeon will assess you the day of or day after surgery.  SYMPTOMS  The symptoms of a PE usually start suddenly and include:  Shortness of breath.  Coughing.  Coughing up blood or blood-tinged mucus.  Chest pain. Pain is often worse with deep breaths.  Rapid heartbeat. DIAGNOSIS  If a PE is suspected, your health care provider will take a medical history and perform a physical exam. Other tests that may be required include:  Blood tests, such as studies of the clotting properties of your blood.  Imaging tests, such as ultrasound, CT, MRI, and other tests to see if you have clots in  your legs or lungs.  An electrocardiogram. This can look for heart strain from blood clots in the lungs. TREATMENT   The most common treatment for a PE is blood thinning (anticoagulant) medicine, which reduces the blood's tendency to clot. Anticoagulants can stop new blood clots from forming and old clots from growing. They cannot dissolve existing clots. Your body does this by itself over time. Anticoagulants can be given by mouth, through an intravenous (IV) tube, or by injection. Your health care provider will determine the best program for you.  Less commonly, clot-dissolving medicines (thrombolytics) are used to dissolve a PE. They carry a high risk of bleeding, so they are used mainly in severe cases.  Very rarely, a blood clot in the leg needs to be removed surgically.  If you are unable to take anticoagulants, your health care provider may arrange for you to have a filter placed in a main vein in your abdomen. This filter prevents clots from traveling to your lungs. HOME CARE INSTRUCTIONS   Take all medicines as directed by your health care provider.  Learn as much as you can about DVT.  Wear a medical alert bracelet or carry a medical alert card.  Ask your health care provider how soon you can go back to normal activities. It is important to stay active to prevent blood clots. If you are on anticoagulant medicine, avoid contact sports.  It is very important to exercise. This is especially important while traveling, sitting, or standing for long periods of time. Exercise your legs by walking or by tightening and relaxing your leg muscles regularly. Take frequent walks.  You may need to wear compression stockings. These are tight elastic stockings that apply pressure to the lower legs. This pressure can help keep the blood in the legs from clotting. Taking Warfarin Warfarin is a daily medicine that is taken by mouth. Your health care provider will advise you on the length of  treatment (usually 3-6 months, sometimes lifelong). If you take warfarin:  Understand how to take warfarin and foods that can affect how warfarin works in Veterinary surgeon.  Too much and too little warfarin are both dangerous. Too much warfarin increases the risk of bleeding. Too little warfarin continues to allow the risk for blood clots. Warfarin and Regular Blood Testing While taking warfarin, you will need to have regular blood tests to measure your blood clotting time. These blood tests usually include both the prothrombin time (PT) and international normalized ratio (INR) tests. The PT and INR results allow your health care provider to adjust your dose of warfarin. It is very important that you have your PT and INR tested as often as directed by your health care provider.  Warfarin and Your Diet Avoid major changes in your diet, or notify your health care provider before changing your diet. Arrange a visit with a registered dietitian to answer your questions. Many foods, especially foods high in vitamin K, can interfere with warfarin and affect the PT and INR results. You should eat a consistent amount of foods high in vitamin K. Foods high in vitamin K include:   Spinach, kale, broccoli, cabbage, collard and turnip greens, Brussels sprouts, peas, cauliflower, seaweed, and parsley.  Beef and pork liver.  Green tea.  Soybean oil. Warfarin with Other Medicines Many medicines can interfere with warfarin and affect the PT and INR results. You must:  Tell your health care provider about any and all medicines, vitamins, and supplements you take, including aspirin and other over-the-counter anti-inflammatory medicines. Be especially cautious with aspirin and anti-inflammatory medicines. Ask your health care provider before taking these.  Do not take or discontinue any prescribed or over-the-counter medicine except on the advice of your health care provider or pharmacist. Warfarin Side  Effects Warfarin can have side effects, such as easy bruising and difficulty stopping bleeding. Ask your health care provider or pharmacist about other side effects of warfarin. You will need to:  Hold pressure over cuts for longer than usual.  Notify your dentist and other health care providers that you are taking warfarin before you undergo any procedures where bleeding may occur. Warfarin with Alcohol and Tobacco   Drinking alcohol frequently can increase the effect of warfarin, leading to excess bleeding. It is best to avoid alcoholic drinks or consume only very small amounts while taking warfarin. Notify your health care provider if you change your alcohol intake.  Do not use any tobacco products including cigarettes, chewing tobacco, or electronic cigarettes. If you smoke, quit. Ask your health care provider for help with quitting smoking. Alternative Medicines to  Warfarin: Factor Xa Inhibitor Medicines  These blood thinning medicines are taken by mouth, usually for several weeks or longer. It is important to take the medicine every single day, at the same time each day.  There are no regular blood tests required when using these medicines.  There are fewer food and drug interactions than with warfarin.  The side effects of this class of medicine is similar to that of warfarin, including excessive bruising or bleeding. Ask your health care provider or pharmacist about other potential side effects. SEEK MEDICAL CARE IF:   You notice a rapid heartbeat.  You feel weaker or more tired than usual.  You feel faint.  You notice increased bruising.  Your symptoms are not getting better in the time expected.  You are having side effects of medicine. SEEK IMMEDIATE MEDICAL CARE IF:   You have chest pain.  You have trouble breathing.  You have new or increased swelling or pain in one leg.  You cough up blood.  You notice blood in vomit, in a bowel movement, or in urine.  You  have a fever. Symptoms of PE may represent a serious problem that is an emergency. Do not wait to see if the symptoms will go away. Get medical help right away. Call your local emergency services (911 in the [place]). Do not drive yourself to the hospital. Document Released: 10/16/2000 Document Revised: 03/05/2014 Document Reviewed: 10/30/2013 [facility] Patient Information 2015 [place], [place]. This information is not intended to replace advice given to you by your health care provider. Make sure you discuss any questions you have with your health care provider.

## 2014-08-25 NOTE — ED Provider Notes (Signed)
Note: This patient was seen on 08/23/14 in [facility] during a computer down time. The documentation was originially performed on backup paper charts. This note was created on 08/25/14.  Allison Lee is a 58 y.o. female who presents to Urgent Care today for  Chest pain. Patient developed chest pain they gave presentation. The pain started at 2:00 in the morning and awoke her from sleep. The pain radiates to the bilateral arms but worse in the left side than the right. The pain is central. The pain is worse with deep inspiration. The pain is stabbing and severe. She notes a subjective fever. She has tried Klonopin which has helped a bit. She additionally notes bilateral left worse than right leg swelling. She has a history of COPD but denies any significant wheezing. She notes chills and sweating.   Past Medical History  Diagnosis Date  . COPD (chronic obstructive pulmonary disease)   . Dysthymia   . Osteoporosis   . IBS (irritable bowel syndrome)   . Other chronic pain   . Arthritis   . GERD (gastroesophageal reflux disease)   . Smoker    History  Substance Use Topics  . Smoking status: Current Every Day Smoker -- 1.00 packs/day for 35 years    Types: Cigarettes  . Smokeless tobacco: Never Used     Comment: trying to cut back  . Alcohol Use: No   ROS as above Medications: No current facility-administered medications for this encounter.   No current outpatient prescriptions on file.   Facility-Administered Medications Ordered in Other Encounters  Medication Dose Route Frequency Provider Last Rate Last Dose  . acetaminophen (TYLENOL) tablet 650 mg  650 mg Oral Q6H PRN Modena Jansky, MD      . calcium-vitamin D (OSCAL WITH D) 500-200 MG-UNIT per tablet 1 tablet  1 tablet Oral BID WC Shanda Howells, MD   1 tablet at 08/25/14 0801  . clonazePAM (KLONOPIN) tablet 1 mg  1 mg Oral TID Shanda Howells, MD   1 mg at 08/25/14 9381  . enoxaparin (LOVENOX) injection 60 mg  60 mg  Subcutaneous Q12H Rogue Bussing, RPH   60 mg at 08/25/14 0054  . nicotine (NICODERM CQ - dosed in mg/24 hours) patch 21 mg  21 mg Transdermal Daily Modena Jansky, MD   21 mg at 08/25/14 8299  . oxyCODONE (Oxy IR/ROXICODONE) immediate release tablet 30 mg  30 mg Oral Q6H Shanda Howells, MD   30 mg at 08/25/14 0801  . pantoprazole (PROTONIX) EC tablet 80 mg  80 mg Oral Q1200 Shanda Howells, MD   80 mg at 08/24/14 1200  . sodium chloride 0.9 % injection 3 mL  3 mL Intravenous Q12H Shanda Howells, MD   3 mL at 08/25/14 1000  . traZODone (DESYREL) tablet 100 mg  100 mg Oral QHS Shanda Howells, MD   100 mg at 08/24/14 2210    Exam:  Temperature 100.42F, heart rate 119, respiratory rate 12, blood pressure 109/63, oxygen saturation 100% on room air Gen: Well NAD HEENT: EOMI,  MMM Lungs: Normal work of breathing. CTABL Heart: Tachycardia but regular no MRG Abd: NABS, Soft. Nondistended, Nontender Exts: 1+ edema bilateral extremities. Left calf is greater than right calf  Twelve-lead EKG shows sinus tachycardia at 1:15 beats per minute. No ST segment elevation or depression. Flattened lateral precordial T wave  Assessment and Plan: 58 y.o. female with tachycardia fever leg swelling and chest pain. Concern for multiple possibilities including  pneumonia or pulmonary embolism or myocardial infarction. [facility] was transferred to [facility] for further evaluation and management. She refused to be transferred via EMS and elected to go via private vehicle. Her husband drove her.  Discussed warning signs or symptoms. Please see discharge instructions. Patient expresses understanding.     Gregor Hams, MD 08/25/14 [phone]

## 2014-08-25 NOTE — ED Notes (Signed)
Chart involved in down time event.  Documentation for this purpose only

## 2014-08-25 NOTE — Discharge Summary (Signed)
Physician Discharge Summary  DIANE HANEL MWU:[id number] DOB: 22-Nov-1955 DOA: 08/23/2014  PCP: Guadlupe Spanish, MD  Admit date: 08/23/2014 Discharge date: 08/25/2014  Time spent: Less than 30 minutes  Recommendations for Outpatient Follow-up:  1. Dr. Corky Crafts, PCP in 1 week. 2. Dr. Minus Breeding, Cardiology - to arrange for OP stress test when deemed appropriate.  Discharge Diagnoses:  Principal Problem:   Pleuritic chest pain Active Problems:   PE (pulmonary embolism)   Hypokalemia   Hypomagnesemia   Discharge Condition: Improved & Stable  Diet recommendation: Heart Healthy diet.  Filed Weights   08/24/14 0130 08/24/14 0308 08/25/14 0531  Weight: 56.246 kg (124 lb) 59.2 kg (130 lb 8.2 oz) 57.9 kg (127 lb 10.3 oz)    History of present illness:  58 year old female patient with history of COPD, ongoing tobacco abuse, chronic cervical pain with radiation to left shoulder & LUE, chronic low back pain with radiation to LLE, hypertension who woke up at a.m. on 08/23/14 with sharp midsternal chest pain radiating to the left side of neck and upper extremity. She thought that her neck pain and left upper extremity pain were secondary to chronic cervical pain. However over the next few hours chest pain got worse-pleuritic in nature and presented to the ED. She denies cough or dyspnea. She denies recent long-distance travel. She does have history of exertional midsternal chest pain radiating to left side of neck and left upper extremity for the last 2-3 months, relieved by rest. Denies prior history of MI/CAD, cardiac cath or stress test. Family history positive for CAD-MI in dad at age 23. In the ED, CT chest confirmed tiny and nonocclusive appearing right upper lobe branch pulmonary embolism-not sure this is the cause of her chest pain.   Hospital Course:   1. Chest pain: Has some pleuritic componant during the last episode but does have pain suspicious for angina ongoing for  the last 2-3 months. Not sure if the tiny pulmonary embolism will explain all her chest pain symptoms. Risk factors for CAD-tobacco abuse, hypertension and family history. Ruled out for MI by negative troponins x3. Cardiology consulted and recommended getting an echocardiogram and if normal, no further inpatient workup is indicated and plan for outpatient stress testing. 2-D echo results as below-unremarkable. Chest pain resolved. DD-from chronic cervicogenic pain, other musculoskeletal pain, GERD/esophageal spasm, PE versus others. Continue PPI. 2. Acute pulmonary embolism: Tiny nonocclusive appearing right upper lobe branch pulmonary embolism. Initially placed on full dose Lovenox and switched to Xarelto-Will get first dose tonight prior to discharge. Case management has assisted patient with discount card. This appears to be a non-provoked pulmonary embolism-recommend outpatient workup for prothrombotic state. Consider at least 6 months of anticoagulation. 3. Tobacco abuse: Cessation counseled. Patient requests nicotine patch. 30 pack year smoking history 4. Essential hypertension: Soft blood pressures. Not on antihypertensives. Stable. 5. Chronic neck and low back pain with radicular symptoms: Outpatient follow up 6. History of panic attacks and depression: Follows with psychiatry/Dr. Laren Everts OP 7. Hypokalemia/hypomagnesemia: Replaced 8. Anemia & mild thrombocytopenia: CBC normal today. 9. Fever: Had temperature of 101F in ED without symptoms suggestive of source. Chest x-ray and CTA chest without pneumonia. UA negative. Monitor off antibiotics. No further fevers. 10. COPD stable   Consultations:  Cardiology  Procedures:  2-D echo 08/25/14: Study Conclusions  - Left ventricle: The cavity size was normal. Wall thickness was normal. Systolic function was normal. The estimated ejection fraction was in the range of 60% to 65%. Wall motion  was normal; there were no regional wall motion  abnormalities. Left ventricular diastolic function parameters were normal. - Mitral valve: There was trivial regurgitation. - Left atrium: The atrium was mildly dilated. - Right atrium: The atrium was mildly dilated. - Tricuspid valve: There was moderate regurgitation. - Pulmonary arteries: PA peak pressure: 50 mm Hg (S). Moderately elevated pulmonary pressures.    Discharge Exam:  Complaints:  No further chest pains.  Filed Vitals:   08/24/14 1423 08/24/14 2100 08/25/14 0531 08/25/14 1412  BP: 109/73 109/75 129/79 97/76  Pulse: 85 78 104 86  Temp: 98.6 F (37 C) 98.5 F (36.9 C) 98.7 F (37.1 C) 98.4 F (36.9 C)  TempSrc: Oral Oral Oral Oral  Resp: 18 18 16 17   Height:      Weight:   57.9 kg (127 lb 10.3 oz)   SpO2: 98% 98% 91% 98%   General exam: Pleasant middle-aged female sitting up comfortably in bed.  Respiratory system: Clear. No increased work of breathing. No reproducible chest wall tenderness.  Cardiovascular system: S1 & S2 heard, RRR. No JVD, murmurs, gallops, clicks or pedal edema. Telemetry: Sinus rhythm.  Gastrointestinal system: Abdomen is nondistended, soft and nontender. Normal bowel sounds heard.  Central nervous system: Alert and oriented. No focal neurological deficits.  Extremities: Symmetric 5 x 5 power.   Discharge Instructions      Discharge Instructions   Call MD for:  severe uncontrolled pain    Complete by:  As directed      Diet - low sodium heart healthy    Complete by:  As directed      Increase activity slowly    Complete by:  As directed             Medication List         bisacodyl 5 MG EC tablet  Commonly known as:  DULCOLAX  Take 15-20 mg by mouth at bedtime.     Calcium-Vitamin D 500-100 MG-UNIT Wafr  Take 1 each by mouth 2 (two) times daily.     clonazePAM 1 MG tablet  Commonly known as:  KLONOPIN  Take 1 mg by mouth 3 (three) times daily.     cyclobenzaprine 5 MG tablet  Commonly known as:  FLEXERIL  Take 1  tablet (5 mg total) by mouth 3 (three) times daily.     hydrOXYzine 50 MG tablet  Commonly known as:  ATARAX/VISTARIL  Take 50 mg by mouth at bedtime.     NEXIUM 40 MG capsule  Generic drug:  esomeprazole  Take 40 mg by mouth daily as needed.     nicotine 21 mg/24hr patch  Commonly known as:  NICODERM CQ - dosed in mg/24 hours  Place 1 patch (21 mg total) onto the skin daily.     oxycodone 30 MG immediate release tablet  Commonly known as:  ROXICODONE  Take 30 mg by mouth every 6 (six) hours.     Rivaroxaban 15 MG Tabs tablet  Commonly known as:  XARELTO  Take 1 tablet (15 mg total) by mouth 2 (two) times daily with a meal.     rivaroxaban 20 MG Tabs tablet  Commonly known as:  XARELTO  Take 1 tablet (20 mg total) by mouth daily with supper. First dose on Sun 09/16/14 at 1700  Start taking on:  09/16/2014     traZODone 100 MG tablet  Commonly known as:  DESYREL  Take 100 mg by mouth at bedtime.  Follow-up Information   Follow up with Guadlupe Spanish, MD. Schedule an appointment as soon as possible for a visit in 1 week.   Specialty:  Internal Medicine   Contact information:   [place] [address] [phone]       Schedule an appointment as soon as possible for a visit with Minus Breeding, MD. (To arrange for out patient stress test.)    Specialty:  Cardiology   Contact information:   [address] [place] [address] [phone]       The results of significant diagnostics from this hospitalization (including imaging, microbiology, ancillary and laboratory) are listed below for reference.    Significant Diagnostic Studies: Dg Chest 2 View  08/23/2014   CLINICAL DATA:  Mid chest pain since 2 a.m.  EXAM: CHEST  2 VIEW  COMPARISON:  August 05, 2011  FINDINGS: The heart size and mediastinal contours are within normal limits. There is no focal infiltrate, pulmonary edema, or pleural effusion. Patient is status post prior  fixation of lower cervical spine. The visualized skeletal structures are unremarkable.  IMPRESSION: No active cardiopulmonary disease.   Electronically Signed   By: Abelardo Diesel M.D.   On: 08/23/2014 20:56   Ct Angio Chest Pe W/cm &/or Wo Cm  08/23/2014   CLINICAL DATA:  58 year female with acute chest pain since 0200 hr and shortness of Breath. Initial encounter.  EXAM: CT ANGIOGRAPHY CHEST WITH CONTRAST  TECHNIQUE: Multidetector CT imaging of the chest was performed using the standard protocol during bolus administration of intravenous contrast. Multiplanar CT image reconstructions and MIPs were obtained to evaluate the vascular anatomy.  CONTRAST:  138mL OMNIPAQUE IOHEXOL 350 MG/ML SOLN  COMPARISON:  Chest CTA 02/23/2005.  FINDINGS: Good contrast bolus timing in the pulmonary arterial tree. Mild respiratory motion artifact in the lower lobes. Very small and nonocclusive appearing filling defect in right upper lobe pulmonary artery branch (series 47, image 96). No other pulmonary artery filling defect identified.  Lower lung volumes. Dependent atelectasis at both lung bases. Central airways are patent. No acute changes in the right upper lobe. No consolidation.  Lower cervical ACDF hardware. 16 mm low-density left thyroid lobe nodule has increased since 2006, but benign etiology is favored. Negative visualized aorta except for atherosclerosis. No pericardial or pleural effusion. No mediastinal or hilar lymphadenopathy. No axillary lymphadenopathy.  Negative visualized liver, spleen, pancreas, adrenal glands, kidneys, or bowel in the upper abdomen.  Mild scoliosis. Stable benign bone island in T6. No acute osseous abnormality identified.  Review of the MIP images confirms the above findings.  IMPRESSION: 1. Tiny and nonocclusive appearing right upper lobe branch pulmonary embolus (see series 47, image 96). 2. No other acute pulmonary embolus identified. 3. Pulmonary atelectasis. Study discussed by  telephone with Dr. Regenia Skeeter on 08/23/2014 at 23:01 .   Electronically Signed   By: Lars Pinks M.D.   On: 08/23/2014 23:02    Microbiology: Recent Results (from the past 240 hour(s))  URINE CULTURE     Status: None   Collection Time    08/24/14  1:16 AM      Result Value Ref Range Status   Specimen Description URINE, CLEAN CATCH   Final   Special Requests NONE   Final   Culture  Setup Time     Final   Value: 08/24/2014 02:00     Performed at [facility]     Final   Value:  NO GROWTH     Performed at [facility]   Culture     Final   Value: NO GROWTH     Performed at [facility]   Report Status 08/25/2014 FINAL   Final     Labs: Basic Metabolic Panel:  Recent Labs Lab 08/23/14 1946 08/24/14 0404 08/24/14 0725 08/25/14 0240  NA 138 142  --  141  K 3.2* 3.3*  --  3.9  CL 96 107  --  103  CO2 31 28  --  30  GLUCOSE 95 89  --  91  BUN 7 5*  --  7  CREATININE 0.59 0.54  --  0.68  CALCIUM 8.9 7.4*  --  8.9  MG  --   --  1.4* 1.6   Liver Function Tests:  Recent Labs Lab 08/24/14 0404  AST 11  ALT 9  ALKPHOS 65  BILITOT 0.5  PROT 4.7*  ALBUMIN 2.5*   No results found for this basename: LIPASE, AMYLASE,  in the last 168 hours No results found for this basename: AMMONIA,  in the last 168 hours CBC:  Recent Labs Lab 08/23/14 1946 08/24/14 0404 08/25/14 0240  WBC 10.4 7.2 6.5  NEUTROABS  --  3.6 3.7  HGB 13.4 11.6* 12.8  HCT 39.3 34.8* 38.7  MCV 93.1 94.1 94.6  PLT 151 133* 157   Cardiac Enzymes:  Recent Labs Lab 08/24/14 0145 08/24/14 0725 08/24/14 1340  TROPONINI <0.30 <0.30 <0.30   BNP: BNP (last 3 results)  Recent Labs  08/24/14 0145  PROBNP 301.8*   CBG: No results found for this basename: GLUCAP,  in the last 168 hours   Signed:  Vernell Leep, MD, FACP, FHM. [facility] Pager [phone]  If 7PM-7AM, please contact night-coverage [email or web address] Password [facility] 08/25/2014, 3:57  PM

## 2014-08-25 NOTE — Progress Notes (Signed)
Patient discharged per md order.  RN reviewed discharge instructions with patient and patient's husband.  Copy of discharge instructions given to patient, patient and reviewing RN signed a copy of discharge instructions; that copy placed in patients chart.  All questions answered and concerns addressed at time of discharge.  RN gave hard copy RX for nicotine patch and Xarelto to patient at time of discharge.  Patient wheeled to front of hospital via w/c per RN and assisted into private care.  Patient's husband driving.

## 2014-08-25 NOTE — Progress Notes (Signed)
Updated Xarelto dosing.  15mg  BID starting tonight at 2000-- x43 doses so that patient can start taking 20mg  Qsupper on 11/15 rather than taking last 15mg  dose with breakfast 11/14 and then transitioning to 20mg  squpper on that same day--this would be confusing for patient and added one more 15mg  dose to simplify.  Tulip Meharg D. Linville Decarolis, PharmD, BCPS Clinical Pharmacist Pager: [phone] 08/25/2014 3:50 PM

## 2014-08-30 LAB — CULTURE, BLOOD (ROUTINE X 2)
CULTURE: NO GROWTH
Culture: NO GROWTH

## 2015-02-18 ENCOUNTER — Other Ambulatory Visit: Payer: Self-pay

## 2015-02-18 DIAGNOSIS — Z1231 Encounter for screening mammogram for malignant neoplasm of breast: Secondary | ICD-10-CM

## 2015-03-06 ENCOUNTER — Ambulatory Visit: Payer: Self-pay

## 2015-03-08 ENCOUNTER — Ambulatory Visit
Admission: RE | Admit: 2015-03-08 | Discharge: 2015-03-08 | Disposition: A | Discharge status: Home / Self Care | Payer: Medicare Other | Source: Ambulatory Visit

## 2015-03-08 DIAGNOSIS — Z1231 Encounter for screening mammogram for malignant neoplasm of breast: Secondary | ICD-10-CM

## 2015-03-11 ENCOUNTER — Other Ambulatory Visit: Payer: Self-pay | Admitting: Internal Medicine

## 2015-03-11 DIAGNOSIS — R928 Other abnormal and inconclusive findings on diagnostic imaging of breast: Secondary | ICD-10-CM

## 2015-03-18 ENCOUNTER — Ambulatory Visit
Admission: RE | Admit: 2015-03-18 | Discharge: 2015-03-18 | Disposition: A | Discharge status: Home / Self Care | Payer: Medicare Other | Source: Ambulatory Visit | Attending: Internal Medicine | Admitting: Internal Medicine

## 2015-03-18 DIAGNOSIS — R928 Other abnormal and inconclusive findings on diagnostic imaging of breast: Secondary | ICD-10-CM

## 2016-03-03 ENCOUNTER — Other Ambulatory Visit: Payer: Self-pay

## 2016-03-03 DIAGNOSIS — Z1231 Encounter for screening mammogram for malignant neoplasm of breast: Secondary | ICD-10-CM

## 2016-03-19 ENCOUNTER — Ambulatory Visit
Admission: RE | Admit: 2016-03-19 | Discharge: 2016-03-19 | Disposition: A | Discharge status: Home / Self Care | Payer: Medicare Other | Source: Ambulatory Visit

## 2016-03-19 DIAGNOSIS — Z1231 Encounter for screening mammogram for malignant neoplasm of breast: Secondary | ICD-10-CM

## 2016-05-20 ENCOUNTER — Ambulatory Visit: Payer: Medicare Other | Admitting: Physical Medicine & Rehabilitation

## 2016-06-23 ENCOUNTER — Encounter: Payer: Medicare Other | Attending: Physical Medicine & Rehabilitation | Admitting: Physical Medicine & Rehabilitation

## 2016-06-23 ENCOUNTER — Encounter: Payer: Self-pay | Admitting: Physical Medicine & Rehabilitation

## 2016-06-23 VITALS — BP 145/95 | HR 104 | Resp 16

## 2016-06-23 DIAGNOSIS — M47816 Spondylosis without myelopathy or radiculopathy, lumbar region: Secondary | ICD-10-CM | POA: Diagnosis not present

## 2016-06-23 DIAGNOSIS — Z5181 Encounter for therapeutic drug level monitoring: Secondary | ICD-10-CM

## 2016-06-23 DIAGNOSIS — Z79899 Other long term (current) drug therapy: Secondary | ICD-10-CM

## 2016-06-23 DIAGNOSIS — M79605 Pain in left leg: Secondary | ICD-10-CM

## 2016-06-23 DIAGNOSIS — G8929 Other chronic pain: Secondary | ICD-10-CM | POA: Diagnosis not present

## 2016-06-23 DIAGNOSIS — M961 Postlaminectomy syndrome, not elsewhere classified: Secondary | ICD-10-CM | POA: Insufficient documentation

## 2016-06-23 DIAGNOSIS — M542 Cervicalgia: Secondary | ICD-10-CM

## 2016-06-23 DIAGNOSIS — Z9889 Other specified postprocedural states: Secondary | ICD-10-CM | POA: Diagnosis not present

## 2016-06-23 DIAGNOSIS — M7062 Trochanteric bursitis, left hip: Secondary | ICD-10-CM | POA: Insufficient documentation

## 2016-06-23 DIAGNOSIS — M4302 Spondylolysis, cervical region: Secondary | ICD-10-CM | POA: Diagnosis present

## 2016-06-23 DIAGNOSIS — M545 Low back pain, unspecified: Secondary | ICD-10-CM

## 2016-06-23 DIAGNOSIS — G894 Chronic pain syndrome: Secondary | ICD-10-CM

## 2016-06-23 MED ORDER — ZONISAMIDE 100 MG PO CAPS
100.0000 mg | ORAL_CAPSULE | Freq: Every day | ORAL | 4 refills | Status: DC
Start: 2016-06-23 — End: 2016-07-21

## 2016-06-23 MED ORDER — CYCLOBENZAPRINE HCL 10 MG PO TABS: 5.0000 mg | ORAL_TABLET | Freq: Two times a day (BID) | ORAL | 3 refills | Status: DC

## 2016-06-23 MED ORDER — CYCLOBENZAPRINE HCL 10 MG PO TABS: 10.0000 mg | ORAL_TABLET | Freq: Two times a day (BID) | ORAL | 3 refills | Status: DC

## 2016-06-23 NOTE — Progress Notes (Signed)
Subjective:    Patient ID: Allison Lee, female    DOB: 1956/07/16, 60 y.o.   MRN: [id number]  HPI   This is an initial evaluation of Allison Lee who was seen last almost 4 years by former colleage Dr. Lucia Estelle. She lost touch after her father passed away and her family doctor left. Dr Holley Raring, her primary doctor, has been writing her pain medications in the meantime. She states he has cut back her medications to the point where her pain has increased.   She has had two cervical spinal fusions, one in 2002 and one in 2013 by Dr. Ellene Route and Patrice Paradise respectively. Neither surgery helped her pain. She has persistent numbness in her arms and dysesthesias which are worst at night but are present throughout the day. It is also worse when she uses her arm for daily activities around the house. She also has persistent neck pain. She knows what her limits are and stops the activity before the pain is too great.   Her low back pain is central and also radiates down the left leg through the hip to her foot. It also is worst when she is active and moving. It feels better when she sits. It does hurt if she ever lays on the left side. The hip pain has been more noticeable over the last few months. She slept on the left side until it became more problematic.    Allison Lee has also suffered with depression and anxiety. She has been seeing Dr. Georgiann Cocker for the last 10-15 years or so which seemed to start after the loss of her brother,mother. Her cymbalta was just increased to 90mg  recently, but she hasn't filled yet. She's also on klonopin tid, vistaril hs, as well as trazodone 100mg  qhs prn.   From pain medication standpoint, flexeril 5mg  at bed, meloxicam 15mg  daily, oxycodone 15mg  4x daily. She's been on gabapentin, lyrica, tegretol which caused swelling. She also has tried [employer] which caused a reaction. She doesn't recall any of these medications working for her radicular pain.   She smokes a  ppd of cigarettes.   Pain Inventory Average Pain 8 Pain Right Now 9 My pain is constant, sharp, burning, stabbing, tingling and aching  In the last 24 hours, has pain interfered with the following? General activity 9 Relation with others 9 Enjoyment of life 9 What TIME of day is your pain at its worst? all Sleep (in general) Poor  Pain is worse with: walking, bending, sitting, standing and some activites Pain improves with: rest and medication Relief from Meds: 3  Mobility walk with assistance use a cane ability to climb steps?  yes transfers alone  Function disabled: date disabled 2005 I need assistance with the following:  dressing and shopping  Neuro/Psych bladder control problems weakness numbness tingling trouble walking spasms depression anxiety  Prior Studies Any changes since last visit?  no  Physicians involved in your care Primary care Dr [facility] Psychiatrist Deveron Furlong NP   Family History  Problem Relation Age of Onset  . COPD Mother   . Cancer Mother   . Dementia Mother   . Cancer Father   . Cancer Brother   . Cirrhosis Brother   . CAD Father 13   Social History   Social History  . Marital status: Married    Spouse name: N/A  . Number of children: 2  . Years of education: N/A   Occupational History  . DISABLED  Unemployed   Social History Main Topics  . Smoking status: Current Every Day Smoker    Packs/day: 1.00    Years: 35.00    Types: Cigarettes  . Smokeless tobacco: Never Used     Comment: trying to cut back  . Alcohol use No  . Drug use: No  . Sexual activity: Not Asked   Other Topics Concern  . None   Social History Narrative   Lives at home with husband   Past Surgical History:  Procedure Laterality Date  . CERVICAL SPINE SURGERY     x 2  . FOOT SURGERY  2009   Past Medical History:  Diagnosis Date  . Arthritis   . COPD (chronic obstructive pulmonary disease) ([place])   .  Dysthymia   . GERD (gastroesophageal reflux disease)   . IBS (irritable bowel syndrome)   . Osteoporosis   . Other chronic pain   . Smoker    BP (!) 145/95 (BP Location: Left Arm, Patient Position: Sitting, Cuff Size: Normal)   Pulse (!) 104   Resp 16   SpO2 95%   Opioid Risk Score:   Fall Risk Score:  `1  Depression screen PHQ 2/9  Depression screen PHQ 2/9 06/23/2016  Decreased Interest 3  Down, Depressed, Hopeless 3  PHQ - 2 Score 6  Altered sleeping 3  Tired, decreased energy 3  Change in appetite 0  Feeling bad or failure about yourself  2  Trouble concentrating 2  Moving slowly or fidgety/restless 2  Suicidal thoughts 0  PHQ-9 Score 18  Difficult doing work/chores Very difficult    Review of Systems  Constitutional: Positive for unexpected weight change.  Respiratory: Positive for shortness of breath.   Cardiovascular: Positive for leg swelling.  Gastrointestinal: Positive for constipation.  All other systems reviewed and are negative.      Objective:   Physical Exam   General: Alert and oriented x 3, No apparent distress. Normal build. Smells of smoke. HEENT: Head is normocephalic, atraumatic, PERRLA, EOMI, sclera anicteric, oral mucosa pink and moist, dentition intact, ext ear canals clear,  Neck: Supple without JVD or lymphadenopathy Heart: Reg rate and rhythm. No murmurs rubs or gallops Chest: CTA bilaterally without wheezes, rales, or rhonchi; no distress Abdomen: Soft, non-tender, non-distended, bowel sounds positive. Extremities: No clubbing, cyanosis, or edema. Pulses are 2+ Skin: Clean and intact without signs of breakdown Neuro: Pt is cognitively appropriate with normal insight, memory, and awareness. Cranial nerves 2-12 are intact. Sensory exam is normal. Reflexes are 1+ in all 4's. Fine motor coordination is grossly intact. No tremors. Motor function is grossly 4/5 to 5/5 in the UE's with some limitations due to pain. LE: 3-4/5 prox to distal with  pain inhibition as well. I saw no consistent signs of sensory loss..  Musculoskeletal: Cervical ROM limited in all planes today. She had pain with flexion and extension. She is tender along the cervical paraspinals which show some atrophy. Low back reveals mild lumbar levoscoliosis and prominence of the left lumbar musculature. She is tender in the bilateral PSIS areas and lesser so to palpation in the low back. She could bend to almost 45 degrees, extension was limited to 5 degrees and lateral bending and rotation wer limited to about 10 degrees---all movements provoked pain, but extension provoked the most pain. She walks with antalgia favoring the left hip. Left greater troch is tender. Crossed leg maneuver positive on the left. SLR was equivocal bilaterally.  Psych: Pt's affect is appropriate.  Pt is cooperative        Assessment & Plan:  1. Cervical spondylolysis with chronic neck pain and decreased range of motion, chronic left upper extremity pain. Patient is s/p cervical spine surgery per Dr Patrice Paradise on Mar 09, 2012. No notes available regarding procedure.  2. Lumbar degenerative disc disease as well as facet arthropathy and chronic low back pain. Facets appear most symptomatic.   -facet stretches were provided  -she is not interested in any furhter injections  -discussed the importance of daily physical activity from both a pain and emotional stand point 3. 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 Although she's had intolerance of anticonvulsants in the past (gabapentin and lyrica) but I think we need to see if we can find something which addresses her radicular pain. 4. Left greater troch bursitis---hip/pelvic stretches were provided.   -reviewed ICE  -consider injection if needed 5. Left RTC syndrome- consvt mgt for now. Will address further as we "unpeel the layers" 6.  Chronic pain syndrome  Plan she has found oxycodone to be quite helpful in the management of her  above pain complaints.  Will consider titrating her regimen slightly to 15-30mg  q6 prn but discussed the risks and long term sequelae of long term opioid use. -Increased flexeril to 10mg  bid -UDS was collected. CSA signed  -reviewed the importance of "holistic mgt" of her pain. -smoking cessation 7. Depression/anxiety---continue psych care. Needs to work on better coping strategies.  Forty-five minutes of face to face patient care time were spent during this visit. All questions were encouraged and answered. Follow up in a month We spent 45 minutes together today. I will see her back in a bout a month. Marland Kitchen

## 2016-06-23 NOTE — Addendum Note (Signed)
Addended by: Gara Kroner L on: 06/23/2016 02:54 PM   Modules accepted: Orders

## 2016-06-23 NOTE — Patient Instructions (Addendum)
Trochanteric Bursitis You have hip pain due to trochanteric bursitis. Bursitis means that the sack near the outside of the hip is filled with fluid and inflamed. This sack is made up of protective soft tissue. The pain from trochanteric bursitis can be severe and keep you from sleep. It can radiate to the buttocks or down the outside of the thigh to the knee. The pain is almost always worse when rising from the seated or lying position and with walking. Pain can improve after you take a few steps. It happens more often in people with hip joint and lumbar spine problems, such as arthritis or previous surgery. Very rarely the trochanteric bursa can become infected, and antibiotics and/or surgery may be needed. Treatment often includes an injection of local anesthetic mixed with cortisone medicine. This medicine is injected into the area where it is most tender over the hip. Repeat injections may be necessary if the response to treatment is slow. You can apply ice packs over the tender area for 30 minutes every 2 hours for the next few days. Anti-inflammatory and/or narcotic pain medicine may also be helpful. Limit your activity for the next few days if the pain continues. See your caregiver in 5-10 days if you are not greatly improved.  SEEK IMMEDIATE MEDICAL CARE IF:  You develop severe pain, fever, or increased redness.  You have pain that radiates below the knee. EXERCISES STRETCHING EXERCISES - Trochanteric Bursitis  These exercises may help you when beginning to rehabilitate your injury. Your symptoms may resolve with or without further involvement from your physician, physical therapist, or athletic trainer. While completing these exercises, remember:   Restoring tissue flexibility helps normal motion to return to the joints. This allows healthier, less painful movement and activity.  An effective stretch should be held for at least 30 seconds.  A stretch should never be painful. You should only  feel a gentle lengthening or release in the stretched tissue. STRETCH - Iliotibial Band  On the floor or bed, lie on your side so your injured leg is on top. Bend your knee and grab your ankle.  Slowly bring your knee back so that your thigh is in line with your trunk. Keep your heel at your buttocks and gently arch your back so your head, shoulders and hips line up.  Slowly lower your leg so that your knee approaches the floor/bed until you feel a gentle stretch on the outside of your thigh. If you do not feel a stretch and your knee will not fall farther, place the heel of your opposite foot on top of your knee and pull your thigh down farther.  Hold this stretch for __________ seconds.  Repeat __________ times. Complete this exercise __________ times per day. STRETCH - Hamstrings, Supine   Lie on your back. Loop a belt or towel over the ball of your foot as shown.  Straighten your knee and slowly pull on the belt to raise your injured leg. Do not allow the knee to bend. Keep your opposite leg flat on the floor.  Raise the leg until you feel a gentle stretch behind your knee or thigh. Hold this position for __________ seconds.  Repeat __________ times. Complete this stretch __________ times per day. STRETCH - Quadriceps, Prone   Lie on your stomach on a firm surface, such as a bed or padded floor.  Bend your knee and grasp your ankle. If you are unable to reach your ankle or pant leg, use a belt   around your foot to lengthen your reach.  Gently pull your heel toward your buttocks. Your knee should not slide out to the side. You should feel a stretch in the front of your thigh and/or knee.  Hold this position for __________ seconds.  Repeat __________ times. Complete this stretch __________ times per day. STRETCHING - Hip Flexors, Lunge Half kneel with your knee on the floor and your opposite knee bent and directly over your ankle.  Keep good posture with your head over your  shoulders. Tighten your buttocks to point your tailbone downward; this will prevent your back from arching too much.  You should feel a gentle stretch in the front of your thigh and/or hip. If you do not feel any resistance, slightly slide your opposite foot forward and then slowly lunge forward so your knee once again lines up over your ankle. Be sure your tailbone remains pointed downward.  Hold this stretch for __________ seconds.  Repeat __________ times. Complete this stretch __________ times per day. STRETCH - Adductors, Lunge  While standing, spread your legs.  Lean away from your injured leg by bending your opposite knee. You may rest your hands on your thigh for balance.  You should feel a stretch in your inner thigh. Hold for __________ seconds.  Repeat __________ times. Complete this exercise __________ times per day.   This information is not intended to replace advice given to you by your health care provider. Make sure you discuss any questions you have with your health care provider.   Document Released: 11/26/2004 Document Revised: 03/05/2015 Document Reviewed: 01/31/2009 Elsevier Interactive Patient Education 2016 [place] THAT YOUR URINE SPECIMEN IS CONSISTENT WITH YOUR HISTORY AND PRESCRIBED MEDICATIONS, I WILL BE WILLING TO PRESCRIBE YOUR PAIN MEDICATION. THE RESULTS OF YOUR URINE TESTING COULD TAKE A WEEK OR MORE TO RETURN, HOWEVER.  IF WE DO NOT CONTACT YOU REGARDING THESE RESULTS WITHIN 10 DAYS, PLEASE CONTACT us.

## 2016-07-02 LAB — TOXASSURE SELECT,+ANTIDEPR,UR: PDF: 0

## 2016-07-07 NOTE — Progress Notes (Signed)
Urine drug screen for this encounter is consistent for prescribed medications.   

## 2016-07-21 ENCOUNTER — Encounter: Payer: Medicare Other | Attending: Physical Medicine & Rehabilitation | Admitting: Physical Medicine & Rehabilitation

## 2016-07-21 ENCOUNTER — Encounter: Payer: Self-pay | Admitting: Physical Medicine & Rehabilitation

## 2016-07-21 DIAGNOSIS — M47816 Spondylosis without myelopathy or radiculopathy, lumbar region: Secondary | ICD-10-CM | POA: Diagnosis not present

## 2016-07-21 DIAGNOSIS — M4302 Spondylolysis, cervical region: Secondary | ICD-10-CM | POA: Insufficient documentation

## 2016-07-21 DIAGNOSIS — Z9889 Other specified postprocedural states: Secondary | ICD-10-CM | POA: Insufficient documentation

## 2016-07-21 DIAGNOSIS — G8929 Other chronic pain: Secondary | ICD-10-CM | POA: Diagnosis not present

## 2016-07-21 DIAGNOSIS — M545 Low back pain, unspecified: Secondary | ICD-10-CM

## 2016-07-21 DIAGNOSIS — M7062 Trochanteric bursitis, left hip: Secondary | ICD-10-CM | POA: Diagnosis not present

## 2016-07-21 DIAGNOSIS — M542 Cervicalgia: Secondary | ICD-10-CM | POA: Diagnosis not present

## 2016-07-21 DIAGNOSIS — M961 Postlaminectomy syndrome, not elsewhere classified: Secondary | ICD-10-CM

## 2016-07-21 DIAGNOSIS — M79605 Pain in left leg: Secondary | ICD-10-CM

## 2016-07-21 MED ORDER — OXYCODONE HCL 15 MG PO TABS: 15.0000 mg | ORAL_TABLET | Freq: Four times a day (QID) | ORAL | 0 refills | Status: DC | PRN

## 2016-07-21 MED ORDER — CYCLOBENZAPRINE HCL 10 MG PO TABS: 10.0000 mg | ORAL_TABLET | Freq: Three times a day (TID) | ORAL | 3 refills | Status: DC

## 2016-07-21 MED ORDER — ZONISAMIDE 100 MG PO CAPS: 100.0000 mg | ORAL_CAPSULE | Freq: Every day | ORAL | 4 refills | Status: DC

## 2016-07-21 NOTE — Patient Instructions (Signed)
PLEASE CALL ME WITH ANY PROBLEMS OR QUESTIONS [id number])  CONTINUE TO WORK ON YOUR RANGE OF MOTION AND DAILY EXERCISE.

## 2016-07-21 NOTE — Progress Notes (Signed)
Subjective:    Patient ID: Allison Lee, female    DOB: Mar 12, 1956, 60 y.o.   MRN: [id number]  HPI   Allison Lee is here in follow up of her chronic pain. She found the flexeril helpful but wasn't given a full dose. She would like to take it three x daily to help with spasms and sleep at night. The zonegran was never filled. She is using oxycodone 15mg  q6 prn for pain control.  She tells me she has been working on her stretches as well as the exercises we described at last visit.   Her psychiatrist reduced her klonopin to 1mg  daily---had been on TID before. Says it was reduced because of risk of dementia. Since the reduction her anxiety has increased quite a bit.   Allison Lee reports some swelling in her left foot at times and asks what she can do about it. She had surgery on the ankle previously.     Pain Inventory Average Pain 9 Pain Right Now 6 My pain is constant, sharp, burning, stabbing, tingling and aching  In the last 24 hours, has pain interfered with the following? General activity 7 Relation with others 8 Enjoyment of life 9 What TIME of day is your pain at its worst? all Sleep (in general) Poor  Pain is worse with: walking, bending, sitting and standing Pain improves with: rest and medication Relief from Meds: 4  Mobility walk with assistance use a cane how many minutes can you walk? 2 ability to climb steps?  yes do you drive?  yes  Function disabled: date disabled 05/2004 I need assistance with the following:  dressing, meal prep, household duties and shopping  Neuro/Psych weakness numbness tingling trouble walking spasms depression anxiety  Prior Studies Any changes since last visit?  no  Physicians involved in your care Any changes since last visit?  no   Family History  Problem Relation Age of Onset  . COPD Mother   . Cancer Mother   . Dementia Mother   . Cancer Father   . Cancer Brother   . Cirrhosis Brother   . CAD Father 70   Social  History   Social History  . Marital status: Married    Spouse name: N/A  . Number of children: 2  . Years of education: N/A   Occupational History  . DISABLED Unemployed   Social History Main Topics  . Smoking status: Current Every Day Smoker    Packs/day: 1.00    Years: 35.00    Types: Cigarettes  . Smokeless tobacco: Never Used     Comment: trying to cut back  . Alcohol use No  . Drug use: No  . Sexual activity: Not Asked   Other Topics Concern  . None   Social History Narrative   Lives at home with husband   Past Surgical History:  Procedure Laterality Date  . CERVICAL SPINE SURGERY     x 2  . FOOT SURGERY  2009   Past Medical History:  Diagnosis Date  . Arthritis   . COPD (chronic obstructive pulmonary disease) ([place])   . Dysthymia   . GERD (gastroesophageal reflux disease)   . IBS (irritable bowel syndrome)   . Osteoporosis   . Other chronic pain   . Smoker    BP 132/89   Pulse (!) 110   Resp 16   SpO2 93%   Opioid Risk Score:   Fall Risk Score:  `1  Depression screen PHQ 2/9  Depression  screen [facility] 2/9 07/21/2016 06/23/2016  Decreased Interest - 3  Down, Depressed, Hopeless - 3  PHQ - 2 Score - 6  Altered sleeping 3 3  Tired, decreased energy 3 3  Change in appetite - 0  Feeling bad or failure about yourself  - 2  Trouble concentrating - 2  Moving slowly or fidgety/restless - 2  Suicidal thoughts - 0  PHQ-9 Score - 18  Difficult doing work/chores - Very difficult    Review of Systems  Constitutional: Positive for unexpected weight change.  Respiratory: Positive for shortness of breath.   Cardiovascular: Positive for leg swelling.  All other systems reviewed and are negative.      Objective:   Physical Exam  General: Alert and oriented x 3, No apparent distress. Normal build. Smells of smoke. HEENT: Head is normocephalic, atraumatic, PERRLA, EOMI, sclera anicteric, oral mucosa pink and moist, dentition intact, ext ear canals clear,    Neck: Supple without JVD or lymphadenopathy Heart: Reg rate and rhythm. No murmurs rubs or gallops Chest: CTA bilaterally without wheezes, rales, or rhonchi; no distress Abdomen: Soft, non-tender, non-distended, bowel sounds positive. Extremities: No clubbing, cyanosis, or edema. Pulses are 2+ Skin: Clean and intact without signs of breakdown Neuro: Pt is cognitively appropriate with normal insight, memory, and awareness. Cranial nerves 2-12 are intact. Sensory exam is normal. Reflexes are 1+ in all 4's. Fine motor coordination is grossly intact. No tremors. Motor function is grossly 4/5 to 5/5 in the UE's with some limitations due to pain. LE: 3-4/5 prox to distal with pain inhibition as well. I saw no consistent signs of sensory loss..  Musculoskeletal: Cervical ROM limited in all planes today. She had pain with flexion and extension. She is tender along the cervical paraspinals which show some atrophy. Low back reveals mild lumbar levoscoliosis and prominence of the left lumbar musculature. She remains tender in the bilateral PSIS areas and lesser so to palpation in the low back. She could bend to almost 60 degrees, extension was limited to 5 degrees and lateral bending and rotation wer limited to about 10 degrees---all movements provoked pain, but extension provoked the most pain again today. She walks with antalgia favoring the left hip. Left greater troch is tender. Crossed leg maneuver positive on the left. SLR was equivocal bilaterally.  Psych: Pt's affect is appropriate. Pt is cooperative        Assessment & Plan:  1. Cervical spondylolysis with chronic neck pain and decreased range of motion, chronic left upper extremity pain. Patient is s/p cervical spine surgery per Dr Patrice Paradise on Mar 09, 2012. No notes available regarding procedure.  2. Lumbar degenerative disc disease as well as facet arthropathy and chronic low back pain. Facets appear most symptomatic.              -continue facet  stretches               -continue physical activity daily to tolerance 3. 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 Although she's had intolerance of anticonvulsants in the past (gabapentin and lyrica) but I think we need to see if we can find something which addresses her radicular pain.  -will retry the zonegran (never filled last visit) 4. Left greater troch bursitis.              -continue Ice             -consider injection if needed--I'll leave it up to her.  5. Left  RTC syndrome- consvt mgt for now. Will address further as we "unpeel the layers" 6.  Chronic pain syndrome  Refilled oxycodone 15mg  4x daily  -would prefer avoiding adjuting regimen to 15-30mg  q6 prn   -Increase flexeril to 10mg  TID -again reviewed the importance of "holistic mgt" of her pain. -smoking cessation still needs to be a focus 7. Depression/anxiety---continue psych care. Needs to work on better coping strategies. -consider titrating klonopin to 1mg  q12   30 minutes of face to face patient care time were spent during this visit. All questions were encouraged and answered. Follow up in a month with me or NP

## 2016-07-23 ENCOUNTER — Telehealth: Payer: Self-pay | Admitting: *Deleted

## 2016-07-23 NOTE — Telephone Encounter (Signed)
Did you mean to only give 30 oxycodone to Annett Gula the other day or was that an error?

## 2016-07-24 MED ORDER — OXYCODONE HCL 15 MG PO TABS: 15.0000 mg | ORAL_TABLET | Freq: Four times a day (QID) | ORAL | 0 refills | Status: DC | PRN

## 2016-07-24 NOTE — Telephone Encounter (Signed)
[name] notified and will return RX Monday and will pick up the new RX.  Printed for Allison Lee to sign on Monday

## 2016-07-24 NOTE — Telephone Encounter (Signed)
Supposed to be #120---my apologizes---didn't see the default of #30. Can someone write her a new rx?  thanks

## 2016-08-18 ENCOUNTER — Encounter: Payer: Medicare Other | Admitting: Registered Nurse

## 2016-08-20 ENCOUNTER — Ambulatory Visit: Payer: Medicare Other | Admitting: Registered Nurse

## 2016-08-21 ENCOUNTER — Encounter: Payer: Medicare Other | Attending: Physical Medicine & Rehabilitation | Admitting: Registered Nurse

## 2016-08-21 ENCOUNTER — Encounter: Payer: Self-pay | Admitting: Registered Nurse

## 2016-08-21 VITALS — BP 108/74 | HR 110 | Resp 14

## 2016-08-21 DIAGNOSIS — Z9889 Other specified postprocedural states: Secondary | ICD-10-CM | POA: Insufficient documentation

## 2016-08-21 DIAGNOSIS — G8929 Other chronic pain: Secondary | ICD-10-CM | POA: Diagnosis not present

## 2016-08-21 DIAGNOSIS — M961 Postlaminectomy syndrome, not elsewhere classified: Secondary | ICD-10-CM | POA: Diagnosis not present

## 2016-08-21 DIAGNOSIS — Z79899 Other long term (current) drug therapy: Secondary | ICD-10-CM

## 2016-08-21 DIAGNOSIS — G894 Chronic pain syndrome: Secondary | ICD-10-CM

## 2016-08-21 DIAGNOSIS — M5412 Radiculopathy, cervical region: Secondary | ICD-10-CM

## 2016-08-21 DIAGNOSIS — M4302 Spondylolysis, cervical region: Secondary | ICD-10-CM | POA: Diagnosis present

## 2016-08-21 DIAGNOSIS — Z5181 Encounter for therapeutic drug level monitoring: Secondary | ICD-10-CM

## 2016-08-21 DIAGNOSIS — M5416 Radiculopathy, lumbar region: Secondary | ICD-10-CM

## 2016-08-21 MED ORDER — OXYCODONE HCL 15 MG PO TABS: 15.0000 mg | ORAL_TABLET | Freq: Four times a day (QID) | ORAL | 0 refills | Status: DC | PRN

## 2016-08-21 NOTE — Progress Notes (Signed)
Subjective:    Patient ID: Allison Lee, female    DOB: 14-Oct-1956, 60 y.o.   MRN: [id number]  HPI: Allison Lee is a 60 year old female who returns for follow up appointment and medication refill. She states her pain is located in her neck radiating into her left shoulder, lower back radiating in her left hip, left buttock ( sacral) and left lower extremity laterally. She rates her pain 8. Her current exercise regime is performing stretching exercises and walking. Also states her psychiatrist has increased her Klonopin to BID, which has helped her anxiety.   Pain Inventory Average Pain 7 Pain Right Now 8 My pain is constant, sharp, burning, dull, stabbing, tingling and aching  In the last 24 hours, has pain interfered with the following? General activity 8 Relation with others 9 Enjoyment of life 9 What TIME of day is your pain at its worst? All Sleep (in general) Poor  Pain is worse with: walking, bending, sitting and standing Pain improves with: rest and medication Relief from Meds: 6  Mobility walk with assistance use a cane how many minutes can you walk? 3-5 ability to climb steps?  yes do you drive?  yes transfers alone  Function disabled: date disabled 2005 I need assistance with the following:  dressing, household duties and shopping  Neuro/Psych weakness numbness tingling trouble walking spasms depression anxiety  Prior Studies Any changes since last visit?  no  Physicians involved in your care Any changes since last visit?  no   Family History  Problem Relation Age of Onset  . COPD Mother   . Cancer Mother   . Dementia Mother   . Cancer Father   . Cancer Brother   . Cirrhosis Brother   . CAD Father 54   Social History   Social History  . Marital status: Married    Spouse name: N/A  . Number of children: 2  . Years of education: N/A   Occupational History  . DISABLED Unemployed   Social History Main Topics  . Smoking status:  Current Every Day Smoker    Packs/day: 1.00    Years: 35.00    Types: Cigarettes  . Smokeless tobacco: Never Used     Comment: trying to cut back  . Alcohol use No  . Drug use: No  . Sexual activity: Not Asked   Other Topics Concern  . None   Social History Narrative   Lives at home with husband   Past Surgical History:  Procedure Laterality Date  . CERVICAL SPINE SURGERY     x 2  . FOOT SURGERY  2009   Past Medical History:  Diagnosis Date  . Arthritis   . COPD (chronic obstructive pulmonary disease) ([place])   . Dysthymia   . GERD (gastroesophageal reflux disease)   . IBS (irritable bowel syndrome)   . Osteoporosis   . Other chronic pain   . Smoker    BP 108/74   Pulse (!) 119   Resp 14   SpO2 96%   Opioid Risk Score:   Fall Risk Score:  `1  Depression screen PHQ 2/9  Depression screen [facility] 2/9 07/21/2016 06/23/2016  Decreased Interest - 3  Down, Depressed, Hopeless - 3  PHQ - 2 Score - 6  Altered sleeping 3 3  Tired, decreased energy 3 3  Change in appetite - 0  Feeling bad or failure about yourself  - 2  Trouble concentrating - 2  Moving slowly or  fidgety/restless - 2  Suicidal thoughts - 0  PHQ-9 Score - 18  Difficult doing work/chores - Very difficult     Review of Systems  Constitutional: Positive for unexpected weight change.  Gastrointestinal: Positive for constipation.  Musculoskeletal:       Limb swelling   All other systems reviewed and are negative.      Objective:   Physical Exam  Constitutional: She is oriented to person, place, and time. She appears well-developed and well-nourished.  HENT:  Head: Normocephalic and atraumatic.  Neck: Normal range of motion. Neck supple.  Cardiovascular: Normal rate and regular rhythm.   Pulmonary/Chest: Effort normal and breath sounds normal.  Musculoskeletal:  Normal Muscle Bulk and Muscle Testing Reveals:  Upper Extremities: Full ROM and Muscle Strength 5/5 Right AC Joint Tenderness Thoracic  Paraspinal Tenderness: T-1- T-3  Mainly Right Side Lumbar Hypersensitivity Sacral Tenderness Lower Extremities: Full ROM and Muscle Strength 5/5 Arises from chair slowly using straight cane for support Antalgic Gait  Neurological: She is alert and oriented to person, place, and time.  Skin: Skin is warm and dry.  Psychiatric: She has a normal mood and affect.  Nursing note and vitals reviewed.         Assessment & Plan:  1. Cervical spondylolysis with chronic neck pain/ Cervical Radiculopathy: She has decreased range of motion, chronic left upper extremity pain. Patient s/p cervical spine surgery per Dr Patrice Paradise on Mar 09, 2012.  Continue Zonegran 2. Lumbar degenerative disc disease as well as facet arthropathy and chronic low back pain. Continue HEP 3. 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  4. Left Greater trochanteric bursitis: Continue Ice Therapy and HEP. Marland Kitchen  5. Chronic pain syndrome: Continue Flexeril and Mobic. Refilled oxycodone 15mg  one tablet QID as needed #120.  We will continue the opioid monitoring program, this consists of regular clinic visits, examinations, urine drug screen, pill counts as well as use of [place] Controlled Substance Reporting System.  7. Depression/anxiety-: Psychiatry Following: Continue Klonopin and Cymbalta.  20 minutes of face to face patient care time was spent during this visit. All questions were encouraged and answered.  Follow up in a month

## 2016-09-18 ENCOUNTER — Encounter: Payer: Self-pay | Admitting: Registered Nurse

## 2016-09-18 ENCOUNTER — Encounter: Payer: Medicare Other | Attending: Physical Medicine & Rehabilitation | Admitting: Registered Nurse

## 2016-09-18 ENCOUNTER — Encounter (INDEPENDENT_AMBULATORY_CARE_PROVIDER_SITE_OTHER): Payer: Self-pay

## 2016-09-18 VITALS — BP 123/89 | HR 106 | Resp 14

## 2016-09-18 DIAGNOSIS — G894 Chronic pain syndrome: Secondary | ICD-10-CM | POA: Diagnosis not present

## 2016-09-18 DIAGNOSIS — M4302 Spondylolysis, cervical region: Secondary | ICD-10-CM | POA: Diagnosis not present

## 2016-09-18 DIAGNOSIS — Z9889 Other specified postprocedural states: Secondary | ICD-10-CM | POA: Insufficient documentation

## 2016-09-18 DIAGNOSIS — M961 Postlaminectomy syndrome, not elsewhere classified: Secondary | ICD-10-CM

## 2016-09-18 DIAGNOSIS — M5416 Radiculopathy, lumbar region: Secondary | ICD-10-CM | POA: Diagnosis not present

## 2016-09-18 DIAGNOSIS — G8929 Other chronic pain: Secondary | ICD-10-CM | POA: Diagnosis not present

## 2016-09-18 DIAGNOSIS — Z79899 Other long term (current) drug therapy: Secondary | ICD-10-CM

## 2016-09-18 DIAGNOSIS — M5412 Radiculopathy, cervical region: Secondary | ICD-10-CM | POA: Diagnosis not present

## 2016-09-18 DIAGNOSIS — Z5181 Encounter for therapeutic drug level monitoring: Secondary | ICD-10-CM

## 2016-09-18 MED ORDER — OXYCODONE HCL 15 MG PO TABS: 15.0000 mg | ORAL_TABLET | Freq: Four times a day (QID) | ORAL | 0 refills | Status: DC | PRN

## 2016-09-18 NOTE — Progress Notes (Addendum)
Subjective:    Patient ID: Allison Lee, female    DOB: 1956/03/16, 60 y.o.   MRN: [id number]  HPI: Allison Lee is a 60 year old female who returns for follow up appointment and medication refill. She states her pain is located in her neck radiating into her left arm and left hand, lower back radiating in her left  left lower extremity laterally. She rates her pain 8. Her current exercise regime is performing stretching exercises and walking.  Also states she had two falls this month, can't recall the date, she was walking into her den when her left lower extremity gave out and she fell on her knees. She was able to pick her self up, she didn't seek medical attention. Educated on falls prevention and was instructed to use her cane at all times, she verbalizes understanding.   Pain Inventory Average Pain 8 Pain Right Now 8 My pain is constant, sharp, burning, dull, stabbing, tingling and aching  In the last 24 hours, has pain interfered with the following? General activity 8 Relation with others 8 Enjoyment of life 9 What TIME of day is your pain at its worst? all Sleep (in general) Poor  Pain is worse with: walking, bending, sitting and standing Pain improves with: rest and medication Relief from Meds: 5  Mobility walk with assistance use a cane how many minutes can you walk? 3-5 ability to climb steps?  yes do you drive?  yes  Function disabled: date disabled n/a I need assistance with the following:  dressing, meal prep, household duties and shopping  Neuro/Psych weakness numbness tingling trouble walking spasms depression anxiety  Prior Studies Any changes since last visit?  no  Physicians involved in your care Any changes since last visit?  no   Family History  Problem Relation Age of Onset  . COPD Mother   . Cancer Mother   . Dementia Mother   . Cancer Father   . Cancer Brother   . Cirrhosis Brother   . CAD Father 60   Social History    Social History  . Marital status: Married    Spouse name: N/A  . Number of children: 2  . Years of education: N/A   Occupational History  . DISABLED Unemployed   Social History Main Topics  . Smoking status: Current Every Day Smoker    Packs/day: 1.00    Years: 35.00    Types: Cigarettes  . Smokeless tobacco: Never Used     Comment: trying to cut back  . Alcohol use No  . Drug use: No  . Sexual activity: Not Asked   Other Topics Concern  . None   Social History Narrative   Lives at home with husband   Past Surgical History:  Procedure Laterality Date  . CERVICAL SPINE SURGERY     x 2  . FOOT SURGERY  2009   Past Medical History:  Diagnosis Date  . Arthritis   . COPD (chronic obstructive pulmonary disease) ([place])   . Dysthymia   . GERD (gastroesophageal reflux disease)   . IBS (irritable bowel syndrome)   . Osteoporosis   . Other chronic pain   . Smoker    BP 123/89   Pulse (!) 106   Resp 14   SpO2 93%   Opioid Risk Score:   Fall Risk Score:  `1  Depression screen PHQ 2/9  Depression screen [facility] 2/9 07/21/2016 06/23/2016  Decreased Interest - 3  Down, Depressed, Hopeless -  3  PHQ - 2 Score - 6  Altered sleeping 3 3  Tired, decreased energy 3 3  Change in appetite - 0  Feeling bad or failure about yourself  - 2  Trouble concentrating - 2  Moving slowly or fidgety/restless - 2  Suicidal thoughts - 0  PHQ-9 Score - 18  Difficult doing work/chores - Very difficult    Review of Systems  Constitutional: Positive for unexpected weight change.  HENT: Negative.   Eyes: Negative.   Respiratory: Negative.   Cardiovascular: Negative.   Gastrointestinal: Positive for constipation.  Endocrine: Negative.   Genitourinary: Negative.   Musculoskeletal:       Limb swelling  Skin: Negative.   Allergic/Immunologic: Negative.   Neurological: Negative.   Hematological: Negative.   Psychiatric/Behavioral: Negative.        Objective:   Physical Exam   Constitutional: She is oriented to person, place, and time. She appears well-developed and well-nourished.  Cardiovascular: Normal rate and regular rhythm.   Pulmonary/Chest: Effort normal and breath sounds normal.  Musculoskeletal:  Normal Muscle Bulk and Muscle Testing Reveals: Upper Extremities: Decreased ROM 90 Degrees and Muscle Strength 5/5 Bilateral AC Joint Tenderness L>R Lumbar Paraspinal Tenderness: L-4-L-5 Lower Extremities: Full ROM and Muscle Strength 5/5 Arises from Table with ease Narrow Based Gait  Neurological: She is alert and oriented to person, place, and time.  Skin: Skin is warm and dry.  Psychiatric: She has a normal mood and affect.  Nursing note and vitals reviewed.         Assessment & Plan:  1. Cervical spondylolysis with chronic neck pain/ Cervical Radiculopathy: She has decreased range of motion, chronic left upper extremity pain. Patient s/p cervical spine surgery per Dr Patrice Paradise on Mar 09, 2012.  Continue Zonegran 2. Lumbar degenerative disc disease as well as facet arthropathy and chronic low back pain. Continue HEP 3. 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  4. Left Greater trochanteric bursitis: No complaints today. Continue Ice Therapy and HEP. Marland Kitchen  5. Chronic pain syndrome: Continue Flexeril and Mobic. Refilled oxycodone 15mg  one tablet QID as needed #120.  We will continue the opioid monitoring program, this consists of regular clinic visits, examinations, urine drug screen, pill counts as well as use of [place] Controlled Substance Reporting System.  7. Depression/anxiety-: Psychiatry Following: Continue Klonopin and Cymbalta.  10minutes of face to face patient care time was spent during this visit. All questions were encouraged and answered.  Follow up in a month

## 2016-09-18 NOTE — Addendum Note (Signed)
Addended by: Geryl Rankins D on: 09/18/2016 02:40 PM   Modules accepted: Orders

## 2016-09-26 LAB — TOXASSURE SELECT,+ANTIDEPR,UR

## 2016-09-30 ENCOUNTER — Encounter (HOSPITAL_BASED_OUTPATIENT_CLINIC_OR_DEPARTMENT_OTHER): Payer: Medicare Other | Admitting: Physical Medicine & Rehabilitation

## 2016-09-30 ENCOUNTER — Encounter: Payer: Self-pay | Admitting: Physical Medicine & Rehabilitation

## 2016-09-30 VITALS — BP 149/96 | HR 101 | Temp 98.0°F | Resp 14

## 2016-09-30 DIAGNOSIS — G894 Chronic pain syndrome: Secondary | ICD-10-CM

## 2016-09-30 DIAGNOSIS — M542 Cervicalgia: Secondary | ICD-10-CM | POA: Diagnosis not present

## 2016-09-30 DIAGNOSIS — M47816 Spondylosis without myelopathy or radiculopathy, lumbar region: Secondary | ICD-10-CM

## 2016-09-30 DIAGNOSIS — M7062 Trochanteric bursitis, left hip: Secondary | ICD-10-CM

## 2016-09-30 DIAGNOSIS — M4302 Spondylolysis, cervical region: Secondary | ICD-10-CM | POA: Diagnosis not present

## 2016-09-30 DIAGNOSIS — M961 Postlaminectomy syndrome, not elsewhere classified: Secondary | ICD-10-CM

## 2016-09-30 DIAGNOSIS — G5603 Carpal tunnel syndrome, bilateral upper limbs: Secondary | ICD-10-CM

## 2016-09-30 DIAGNOSIS — M545 Low back pain, unspecified: Secondary | ICD-10-CM

## 2016-09-30 DIAGNOSIS — M79605 Pain in left leg: Principal | ICD-10-CM

## 2016-09-30 DIAGNOSIS — M1288 Other specific arthropathies, not elsewhere classified, other specified site: Secondary | ICD-10-CM

## 2016-09-30 MED ORDER — OXYCODONE HCL 15 MG PO TABS: 15.0000 mg | ORAL_TABLET | Freq: Four times a day (QID) | ORAL | 0 refills | Status: DC | PRN

## 2016-09-30 MED ORDER — PREDNISONE 20 MG PO TABS: 20.0000 mg | ORAL_TABLET | ORAL | 0 refills | Status: DC

## 2016-09-30 MED ORDER — ZONISAMIDE 100 MG PO CAPS: 200.0000 mg | ORAL_CAPSULE | Freq: Every day | ORAL | 4 refills | Status: DC

## 2016-09-30 NOTE — Patient Instructions (Addendum)
WEAR CARPAL TUNNEL/WRIST SPLINTS AT NIGHT ONLY FOR NOW----YOU CAN WEAR DURING THE DAY IF NEEDED.   BACK BRACE/CORSET OK, BUT I DO NOT WANT YOU WEARING IT ALL DAY-----ONLY WHEN YOUR BACK FLARES UP---ONLY 1-2 HOURS THEN.  HOLD MELOXICAM FOR NOW   COMPRESSION STOCKINGS OR ACE WRAPS FOR YOUR LEGS   PLEASE CALL ME WITH ANY PROBLEMS OR QUESTIONS [phone]   HAPPY HOLIDAYS!!!!                    *                * *             *   *   *         *  *   *  *  *     *  *  *  *  *  *  * *  *  *  *  *  *  *  *  *  * *               *  *               *  *               *  *

## 2016-09-30 NOTE — Progress Notes (Signed)
Subjective:    Patient ID: Allison Lee, female    DOB: 11/20/1955, 60 y.o.   MRN: [id number]  HPI   Mrs. Arie is here in follow up of her chronic pain. She fell on thanksgiving day and landed forward on her hands. Since the fall she has had incontinence of bowels. She reports that her bladder is more frequent. In the low back she has had increased pain as well as the right leg. Her neck and left shoulder are also tender. She complains of numbness in both of her hands which is new. There is also new numbness in the right leg. She has had falls previous to this which were documented at her last visit here [place] this month. Since the fall she has been in the bed more and has been using a heating pad on her back. She is taking her medications as well.   She's also concerned that her feet are swelling. This has been going on for a month or so. She can't recall if it started with the zonegran, but she doesn't think so.    Pain Inventory Average Pain 7 Pain Right Now 9 My pain is constant, sharp, burning, dull, stabbing, tingling and aching  In the last 24 hours, has pain interfered with the following? General activity 9 Relation with others 10 Enjoyment of life 10 What TIME of day is your pain at its worst? All Sleep (in general) Poor  Pain is worse with: walking, bending, sitting and inactivity Pain improves with: rest and medication Relief from Meds: 4  Mobility walk with assistance use a cane how many minutes can you walk? 3-5 ability to climb steps?  yes do you drive?  yes transfers alone  Function disabled: date disabled 2005 I need assistance with the following:  dressing, meal prep, household duties and shopping  Neuro/Psych bowel control problems weakness numbness tremor tingling trouble walking spasms confusion depression anxiety  Prior Studies Any changes since last visit?  yes  Physicians involved in your care Any changes since last visit?   no   Family History  Problem Relation Age of Onset  . COPD Mother   . Cancer Mother   . Dementia Mother   . Cancer Father   . Cancer Brother   . Cirrhosis Brother   . CAD Father 48   Social History   Social History  . Marital status: Married    Spouse name: N/A  . Number of children: 2  . Years of education: N/A   Occupational History  . DISABLED Unemployed   Social History Main Topics  . Smoking status: Current Every Day Smoker    Packs/day: 1.00    Years: 35.00    Types: Cigarettes  . Smokeless tobacco: Never Used     Comment: trying to cut back  . Alcohol use No  . Drug use: No  . Sexual activity: Not Asked   Other Topics Concern  . None   Social History Narrative   Lives at home with husband   Past Surgical History:  Procedure Laterality Date  . CERVICAL SPINE SURGERY     x 2  . FOOT SURGERY  2009   Past Medical History:  Diagnosis Date  . Arthritis   . COPD (chronic obstructive pulmonary disease) ([place])   . Dysthymia   . GERD (gastroesophageal reflux disease)   . IBS (irritable bowel syndrome)   . Osteoporosis   . Other chronic pain   . Smoker  BP (!) 149/96 (BP Location: Right Arm, Patient Position: Sitting, Cuff Size: Normal)   Pulse (!) 101   Temp 98 F (36.7 C) (Oral)   Resp 14   SpO2 92%   Opioid Risk Score:   Fall Risk Score:  `1  Depression screen PHQ 2/9  Depression screen [facility] 2/9 07/21/2016 06/23/2016  Decreased Interest - 3  Down, Depressed, Hopeless - 3  PHQ - 2 Score - 6  Altered sleeping 3 3  Tired, decreased energy 3 3  Change in appetite - 0  Feeling bad or failure about yourself  - 2  Trouble concentrating - 2  Moving slowly or fidgety/restless - 2  Suicidal thoughts - 0  PHQ-9 Score - 18  Difficult doing work/chores - Very difficult      Review of Systems  Constitutional: Positive for unexpected weight change.  HENT: Negative.   Eyes: Negative.   Respiratory: Positive for shortness of breath.    Cardiovascular: Negative.   Gastrointestinal: Positive for constipation.  Endocrine: Negative.   Genitourinary: Negative.   Musculoskeletal:       Limb swelling  Skin: Negative.   Allergic/Immunologic: Negative.   Neurological: Negative.   Hematological: Negative.   Psychiatric/Behavioral: Negative.   All other systems reviewed and are negative.      Objective:   Physical Exam  General: Alert and oriented x 3, No apparent distress. Normal build. Smells of tobacco smoke. HEENT:PERRL  Neck:Supple without JVD or lymphadenopathy Heart:Reg rate and rhythm. No murmurs rubs or gallops. Tr to 1+ edema bilateral feet/ankles. Chest:CTA no wheezess Abdomen:Soft, non-tender, non-distended, bowel sounds positive. Extremities:No clubbing, cyanosis, or edema. Pulses are 2+ Skin:Clean and intact without signs of breakdown Neuro:Pt is cognitively appropriate with normal insight, memory, and awareness. Cranial nerves 2-12 are intact. Sensory exam is normal. Reflexes are 1+ in all 4's. Fine motor coordination is grosslyintact.   Motor function is grossly 4/5 to 5/5 in the UE's with some pain inhibition in left arm and to an extent in the left leg. LE: 4/5 prox to distal with pain inhibition also. sensed pain and LT in all 4's. Tinel;s and Phalens tests positive in both wrist.  Musculoskeletal:Cervical ROM remains limited in all planes today including with flexion and extension.  . Low back reveals mild lumbar levoscoliosis and prominence of the left lumbar musculature with pelvic lean to right.. She remains tender in the bilateral PSIS areas and lesser so to palpation in the low back. She could bend to only 45 degrees, extension was limited to 5-10 degrees and lateral bending and rotation wer limited to about 10 degrees---all movements provoked pain again today. She walks with antalgia favoring the left hip and uses cane to offload. She shuffles feet slightly and tends to lean forward.Marland Kitchen   Psych:Pt's affect is appropriate. Pt is cooperative      Assessment & Plan:  1. Cervical spondylolysis with chronic neck pain and decreased range of motion, chronic left upper extremity pain. Patient is s/p cervical spine surgery per Dr Patrice Paradise on Mar 09, 2012. No notes available regarding procedure.  2. Lumbar degenerative disc disease as well as facet arthropathy and chronic low back pain. Facets appear most symptomatic.  -continue facet stretches were reviewed  -asked her to forward me copies of her recent MRI's (cervical and lumbar)  -may wear a lumbar corset occasionally for breakthorugh pain. I descrbed why I didn't want her using all the time 3. 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   -  increased zonegran to 200mg  at HS--if she notices any increase in edema she'll let me know 4. Left greater troch bursitis.  -continue Ice -stretching reviewed.  5. Left RTC syndrome- consvt mgt for now. Basic ROM 6. Chronic pain syndrome  Refilled oxycodone 15mg  4x daily #120  -maintainflexeril to 10mg  TID -again reviewed the importance of "holistic mgt" of her pain. -smoking cessation still needs to be a focus 7. Depression/anxiety---continue psych care. Needs to work on better coping strategies. -consider titrating klonopin to 1mg  q12 8. Recent fall: ?exacerbation of CTS  -arrange NCS of bilateral UE  -bilateral CTS splints to wear at nights  -steroid taper for pain relief  -MRI's to be reviewed as above 9. Lower ext edema: elevation, TEDs, hold meloxicam  -follow up with PCP   25 minutes of face to face patient care time were spent during this visit. All questions were encouraged and answered. Follow up in a month with me or NP

## 2016-10-13 ENCOUNTER — Encounter: Payer: Medicare Other | Attending: Physical Medicine & Rehabilitation | Admitting: Registered Nurse

## 2016-10-13 ENCOUNTER — Encounter: Payer: Self-pay | Admitting: Registered Nurse

## 2016-10-13 VITALS — BP 128/84 | HR 104 | Resp 14

## 2016-10-13 DIAGNOSIS — Z9889 Other specified postprocedural states: Secondary | ICD-10-CM | POA: Diagnosis not present

## 2016-10-13 DIAGNOSIS — M542 Cervicalgia: Secondary | ICD-10-CM | POA: Diagnosis not present

## 2016-10-13 DIAGNOSIS — G8929 Other chronic pain: Secondary | ICD-10-CM | POA: Diagnosis not present

## 2016-10-13 DIAGNOSIS — M961 Postlaminectomy syndrome, not elsewhere classified: Secondary | ICD-10-CM | POA: Diagnosis not present

## 2016-10-13 DIAGNOSIS — M4302 Spondylolysis, cervical region: Secondary | ICD-10-CM | POA: Insufficient documentation

## 2016-10-13 DIAGNOSIS — Z79899 Other long term (current) drug therapy: Secondary | ICD-10-CM

## 2016-10-13 DIAGNOSIS — G5603 Carpal tunnel syndrome, bilateral upper limbs: Secondary | ICD-10-CM

## 2016-10-13 DIAGNOSIS — Z5181 Encounter for therapeutic drug level monitoring: Secondary | ICD-10-CM

## 2016-10-13 DIAGNOSIS — M1288 Other specific arthropathies, not elsewhere classified, other specified site: Secondary | ICD-10-CM | POA: Diagnosis not present

## 2016-10-13 DIAGNOSIS — M545 Low back pain: Secondary | ICD-10-CM

## 2016-10-13 DIAGNOSIS — M7062 Trochanteric bursitis, left hip: Secondary | ICD-10-CM

## 2016-10-13 DIAGNOSIS — M79605 Pain in left leg: Secondary | ICD-10-CM

## 2016-10-13 DIAGNOSIS — M47816 Spondylosis without myelopathy or radiculopathy, lumbar region: Secondary | ICD-10-CM

## 2016-10-13 DIAGNOSIS — G894 Chronic pain syndrome: Secondary | ICD-10-CM

## 2016-10-13 MED ORDER — ZONISAMIDE 100 MG PO CAPS: 200.0000 mg | ORAL_CAPSULE | Freq: Every day | ORAL | 4 refills | Status: DC

## 2016-10-13 MED ORDER — OXYCODONE HCL 15 MG PO TABS: 15.0000 mg | ORAL_TABLET | Freq: Four times a day (QID) | ORAL | 0 refills | Status: DC | PRN

## 2016-10-13 NOTE — Progress Notes (Signed)
Subjective:    Allison Allison, female    DOB: Apr 16, 1956, 60 y.o.   MRN: [id number]  HPI: Allison Allison is a 60 year old female who returns for follow up appointment and medication refill. She states her pain is located in her neck radiating into her left shoulder and lower back radiating in her left  left lower extremity laterally. She rates her pain 8. Her current exercise regime is performing stretching exercises and walking.  Allison Allison return prescriptions from 09/30/2016 Zonegran, Oxycodone and prednisone. She wasn't able to purchase due to financial hardship. Prescriptions destroyed and reprinted except for prednisone.    Pain Inventory Average Pain 10 Pain Right Now 8 My pain is constant, sharp, burning, dull, stabbing, tingling and aching  In Allison last 24 hours, has pain interfered with Allison following? General activity 9 Relation with others 9 Enjoyment of life 10 What TIME of day is your pain at its worst? all Sleep (in general) Poor  Pain is worse with: walking, bending, sitting, inactivity and standing Pain improves with: rest and medication Relief from Meds: 6  Mobility walk without assistance walk with assistance use a cane how many minutes can you walk? 3-5 ability to climb steps?  yes do you drive?  yes transfers alone  Function disabled: date disabled 04/2004 I need assistance with Allison following:  dressing, meal prep, household duties and shopping  Neuro/Psych bowel control problems weakness numbness tremor tingling trouble walking spasms dizziness confusion depression anxiety  Prior Studies Any changes since last visit?  no  Physicians involved in your care Any changes since last visit?  no   Family History  Problem Relation Age of Onset  . COPD Mother   . Cancer Mother   . Dementia Mother   . Cancer Father   . CAD Father 37  . Cancer Brother   . Cirrhosis Brother    Social History   Social History  . Marital  status: Married    Spouse name: N/A  . Number of children: 2  . Years of education: N/A   Occupational History  . DISABLED Unemployed   Social History Main Topics  . Smoking status: Current Every Day Smoker    Packs/day: 1.00    Years: 35.00    Types: Cigarettes  . Smokeless tobacco: Never Used     Comment: trying to cut back  . Alcohol use No  . Drug use: No  . Sexual activity: Not Asked   Other Topics Concern  . None   Social History Narrative   Lives at home with husband   Past Surgical History:  Procedure Laterality Date  . CERVICAL SPINE SURGERY     x 2  . FOOT SURGERY  2009   Past Medical History:  Diagnosis Date  . Arthritis   . COPD (chronic obstructive pulmonary disease) ([place])   . Dysthymia   . GERD (gastroesophageal reflux disease)   . IBS (irritable bowel syndrome)   . Osteoporosis   . Other chronic pain   . Smoker    BP 128/84 (BP Location: Right Arm, Allison Position: Sitting, Cuff Size: Large)   Pulse (!) 120   Resp 14   SpO2 92%   Opioid Risk Score:   Fall Risk Score:  `1  Depression screen PHQ 2/9  Depression screen [facility] 2/9 07/21/2016 06/23/2016  Decreased Interest - 3  Down, Depressed, Hopeless - 3  PHQ - 2 Score - 6  Altered sleeping 3 3  Tired, decreased energy 3 3  Change in appetite - 0  Feeling bad or failure about yourself  - 2  Trouble concentrating - 2  Moving slowly or fidgety/restless - 2  Suicidal thoughts - 0  PHQ-9 Score - 18  Difficult doing work/chores - Very difficult    Review of Systems  HENT: Negative.   Eyes: Negative.   Respiratory: Positive for shortness of breath.   Cardiovascular: Positive for leg swelling.  Gastrointestinal: Positive for constipation.  Endocrine: Negative.   Genitourinary: Negative.   Musculoskeletal: Positive for arthralgias, back pain, gait problem and myalgias.  Skin: Negative.   Allergic/Immunologic: Negative.   Neurological: Positive for dizziness, tremors, weakness and  numbness.       Tingling  Hematological: Negative.   Psychiatric/Behavioral: Positive for confusion and dysphoric mood. Allison Allison is nervous/anxious.   All other systems reviewed and are negative.      Objective:   Physical Exam  Constitutional: She is oriented to person, place, and time. She appears well-developed and well-nourished.  HENT:  Head: Normocephalic and atraumatic.  Neck: Normal range of motion. Neck supple.  Cervical Paraspinal Tenderness: C-5-C-6  Cardiovascular: Normal rate and regular rhythm.   Pulmonary/Chest: Effort normal and breath sounds normal.  Musculoskeletal:  Normal Muscle Bulk and Muscle Testing Reveals:  Upper Extremities: Full ROM and Muscle Strength on Allison right 5/5 and Left 4/5 Left AC Joint Tenderness Lumbar Paraspinal Tenderness: L-3-L-5 Lumbar Flexion 45 Degrees and Extension 20 Degrees Lower Extremities: Full ROM and Muscle Strength 5/5 Bilateral Lower Extremities Flexion Produces Pain into Lumbar Arises from Table Slowly using straight cane for support Narrow Based Gait   Neurological: She is alert and oriented to person, place, and time.  Skin: Skin is warm and dry.  Psychiatric: She has a normal mood and affect.  Nursing note and vitals reviewed.         Assessment & Plan:  1. Cervical spondylolysis with chronic neck pain/ Cervical Radiculopathy: She hasdecreased range of motion, chronic left upper extremity pain. Allison s/p cervical spine surgery per Dr Patrice Paradise on Mar 09, 2012.  Continue Zonegran 2. Lumbar degenerative disc disease as well as facet arthropathy and chronic low back pain. Continue HEP 3. 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  4. Left Greater trochantericbursitis: No complaints today. Continue Ice Therapy and HEP. Marland Kitchen  5. Chronic pain syndrome: Continue Flexeril and Mobic. Refilled oxycodone 15mg  one tablet QID as needed #120.  We will continue Allison opioid monitoring  program, this consists of regular clinic visits, examinations, urine drug screen, pill counts as well as use of [place] Controlled Substance Reporting System.  7. Depression/anxiety-: Psychiatry Following: Continue Klonopin and Cymbalta.  78minutes of face to face Allison care time wasspent during this visit. All questions were encouraged and answered.  Follow up in a month

## 2016-10-15 ENCOUNTER — Ambulatory Visit: Payer: Medicare Other | Admitting: Family Medicine

## 2016-11-11 ENCOUNTER — Encounter: Payer: Medicare Other | Attending: Physical Medicine & Rehabilitation | Admitting: Physical Medicine & Rehabilitation

## 2016-11-11 VITALS — BP 121/84 | HR 104 | Resp 14

## 2016-11-11 DIAGNOSIS — M545 Low back pain, unspecified: Secondary | ICD-10-CM

## 2016-11-11 DIAGNOSIS — M542 Cervicalgia: Secondary | ICD-10-CM | POA: Diagnosis not present

## 2016-11-11 DIAGNOSIS — M79605 Pain in left leg: Secondary | ICD-10-CM

## 2016-11-11 DIAGNOSIS — G8929 Other chronic pain: Secondary | ICD-10-CM | POA: Diagnosis not present

## 2016-11-11 DIAGNOSIS — M4302 Spondylolysis, cervical region: Secondary | ICD-10-CM | POA: Insufficient documentation

## 2016-11-11 DIAGNOSIS — G5603 Carpal tunnel syndrome, bilateral upper limbs: Secondary | ICD-10-CM

## 2016-11-11 DIAGNOSIS — Z9889 Other specified postprocedural states: Secondary | ICD-10-CM | POA: Diagnosis not present

## 2016-11-11 DIAGNOSIS — M961 Postlaminectomy syndrome, not elsewhere classified: Secondary | ICD-10-CM

## 2016-11-11 DIAGNOSIS — M7062 Trochanteric bursitis, left hip: Secondary | ICD-10-CM

## 2016-11-11 DIAGNOSIS — G894 Chronic pain syndrome: Secondary | ICD-10-CM | POA: Diagnosis not present

## 2016-11-11 DIAGNOSIS — M47816 Spondylosis without myelopathy or radiculopathy, lumbar region: Secondary | ICD-10-CM

## 2016-11-11 DIAGNOSIS — M1288 Other specific arthropathies, not elsewhere classified, other specified site: Secondary | ICD-10-CM

## 2016-11-11 MED ORDER — TRAZODONE HCL 100 MG PO TABS: 200.0000 mg | ORAL_TABLET | Freq: Every day | ORAL | 3 refills | Status: DC

## 2016-11-11 MED ORDER — OXYCODONE HCL 15 MG PO TABS: 15.0000 mg | ORAL_TABLET | Freq: Four times a day (QID) | ORAL | 0 refills | Status: DC | PRN

## 2016-11-11 MED ORDER — CYCLOBENZAPRINE HCL 10 MG PO TABS: 10.0000 mg | ORAL_TABLET | Freq: Three times a day (TID) | ORAL | 3 refills | Status: DC

## 2016-11-11 NOTE — Progress Notes (Signed)
PROCEDURE REPORT UPPER EXTREMITY EMG/NCS  (Bilateral) COMPLAINT: Bilateral hand tingling/numbness   Nerve Conduction Study: A  bilateral upper extremity nerve conduction screen was performed. All skin was prepped with isopropyl alcohol. The median, ulnar,  sensory nerves were stimulated antidromically using the middle finger, 5th finger, and the second metacarpal respectively as sites for the active and reference electrodes. Testing revealed normal distal latencies and SNAPs in all nerves tested.   Median and ulnar motor responses were measured orthodromically using the APB and ADM as the site for placement of the active and reference electrodes. The nerves were stimulated both at the wrist.  The median nerve was also stimulated in the antecubital fossa. The ulnar nerve was stimulated above and below the ulnar groove. Testing revealed normal latenices, CMAP's and nerve conduction velocities bilaterally  Conclusion: Normal study  Discussed with patient regarding positioning of her arms and hands at night. CAn wear wrist splints at night as needed.      Meredith Staggers, MD, [place]

## 2016-11-11 NOTE — Patient Instructions (Signed)
WEAR SPLINTS AT NIGHT. CHANGE YOUR SLEEPING POSITION TO HELP RELIEVE ANY NUMBNESS.

## 2016-11-25 ENCOUNTER — Telehealth: Payer: Self-pay | Admitting: *Deleted

## 2016-11-25 NOTE — Telephone Encounter (Signed)
Unable to reach patient at time of Pre-Visit Call.  Left message for patient to return call when available.    

## 2016-11-26 ENCOUNTER — Encounter: Payer: Self-pay | Admitting: Family Medicine

## 2016-11-26 ENCOUNTER — Ambulatory Visit (INDEPENDENT_AMBULATORY_CARE_PROVIDER_SITE_OTHER): Payer: Medicare Other | Admitting: Family Medicine

## 2016-11-26 VITALS — BP 113/72 | HR 96 | Temp 97.9°F | Ht 64.0 in | Wt 171.4 lb

## 2016-11-26 DIAGNOSIS — K219 Gastro-esophageal reflux disease without esophagitis: Secondary | ICD-10-CM

## 2016-11-26 DIAGNOSIS — R635 Abnormal weight gain: Secondary | ICD-10-CM

## 2016-11-26 DIAGNOSIS — R600 Localized edema: Secondary | ICD-10-CM | POA: Diagnosis not present

## 2016-11-26 MED ORDER — RANITIDINE HCL 150 MG PO TABS: 150.0000 mg | ORAL_TABLET | Freq: Two times a day (BID) | ORAL | 6 refills | Status: DC

## 2016-11-26 NOTE — Patient Instructions (Signed)
It was nice to see you today- take care and I will be in touch with your labs asap We will look for anything that may be causing your weight gain or swelling Assuming your kidneys are ok, we can try an as needed fluid pill for when you are swollen.  Take care!

## 2016-11-26 NOTE — Progress Notes (Signed)
[place] at [facility] [address], [place], [place] [address] [phone]  Date:  11/26/2016   Name:  Allison Lee   DOB:  10/24/1956   MRN:  [id number]  PCP:  Guadlupe Spanish, MD    Chief Complaint: Establish Care (Pt here to est care. c/o weight gain within the last yr, occ swelling in both feet and legs. Would like flu vaccine today. )   History of Present Illness:  Allison Lee is a 61 y.o. very pleasant female patient who presents with the following: Here today as a new patient to establish care.  History of IBS, GERD, osteoporosis, chronic pain.   Started to gain weight during the last 9 months.  Normal weight all her life is 118-125 lbs.  Filed Weights   11/26/16 1626  Weight: 171 lb 6.4 oz (77.7 kg)   10/2012- weight was 111 Pain mgt MD gave exercise with a resistance band.  Started these exercises.  Trying to do exercises 10-15 minutes per day.  Cannot see any benefit yet, but still performing.  Has 2 wrist braces (carpal tunnel).  Wearing these braces at night.  Should be wearing back brace at night.  Going to a pain clinic in [place] (Dr. Naaman Plummer).  Feet and legs swell.  Sometimes cannot get left shoe on.  Swelling is off and on.  Sometimes it will stay swollen for a week.  Swelling seems to be worse when taking Dulcolax.  Milk of Magnesia does not affect  Swelling.  Sometimes will get better overnight but not always  Has had another MD recommending back surgery, but patient is very leary, due to several previous neck surgeries.  Helping to take care of her mother-in-law (who lives alone), who is a patient of Dr. Charlett Blake.  Dr. Laren Everts with [facility] prescribing Klonopin. Taking BID.  Dr. Naaman Plummer prescribing Oxycodone.  Requesting medication for acid reflux early in morning, meal time and bedtime.  Has had Nexium before, with benefit.  Open to other medications, if they are cheaper.  She did  have a PE a couple of years ago- she was admitted and treated with xarelto.  She was able to stop using  She has not noted any orthopnea.  She went through menopause "back in the 1980s- last period in 1989."  Dr, Ulanda Edison is her GYN  Uses cane when walking. She does not think she has changed her diet or activity level to explain this weight gain   Patient Active Problem List   Diagnosis Date Noted  . Bilateral carpal tunnel syndrome 09/30/2016  . Lumbar facet arthropathy 06/23/2016  . Trochanteric bursitis of left hip 06/23/2016  . Cervical post-laminectomy syndrome 06/23/2016  . PE (pulmonary embolism) 08/24/2014  . Chest pain 08/24/2014  . Pleuritic chest pain 08/24/2014  . Hypokalemia 08/24/2014  . Hypomagnesemia 08/24/2014  . Medication monitoring encounter 10/21/2012  . Medication management 10/21/2012  . Cervicalgia 02/29/2012  . Lumbago 02/29/2012  . Lumbar pain with radiation down left leg 02/29/2012  . Neuropathic pain, arm 02/29/2012  . Elevated BP 01/05/2012  . Chronic pain disorder 01/05/2012  . GERD (gastroesophageal reflux disease) 01/04/2012  . Depression 01/04/2012  . COPD (chronic obstructive pulmonary disease) ([place])   . Osteoporosis   . IBS (irritable bowel syndrome)   . Other chronic pain     Past Medical History:  Diagnosis Date  . Arthritis   . COPD (chronic obstructive pulmonary disease) ([place])   .  Dysthymia   . GERD (gastroesophageal reflux disease)   . IBS (irritable bowel syndrome)   . Osteoporosis   . Other chronic pain   . Smoker     Past Surgical History:  Procedure Laterality Date  . CERVICAL SPINE SURGERY     x 2  . FOOT SURGERY  2009    Social History  Substance Use Topics  . Smoking status: Current Every Day Smoker    Packs/day: 1.00    Years: 35.00    Types: Cigarettes  . Smokeless tobacco: Never Used     Comment: trying to cut back  . Alcohol use No    Family History  Problem Relation Age of Onset  . COPD Mother   .  Cancer Mother   . Dementia Mother   . Cancer Father   . CAD Father 36  . Cancer Brother   . Cirrhosis Brother     Allergies  Allergen Reactions  . Methadone Other (See Comments)    Fluid retention  . Hydrocodone Nausea Only  . Topamax Other (See Comments)    osteoporosis  . Hctz [Hydrochlorothiazide] Rash  . Neurontin [Gabapentin] Swelling  . Pregabalin Other (See Comments)    Dizziness    Medication list has been reviewed and updated.  Current Outpatient Prescriptions on File Prior to Visit  Medication Sig Dispense Refill  . bisacodyl (DULCOLAX) 5 MG EC tablet Take 15-20 mg by mouth at bedtime.    . Calcium-Vitamin D 500-100 MG-UNIT WAFR Take 1 each by mouth 2 (two) times daily.     . clonazePAM (KLONOPIN) 1 MG tablet Take 1 mg by mouth 2 (two) times daily.     . cyclobenzaprine (FLEXERIL) 10 MG tablet Take 1 tablet (10 mg total) by mouth 3 (three) times daily. 90 tablet 3  . DULoxetine (CYMBALTA) 30 MG capsule Take 90 mg by mouth daily.    . hydrOXYzine (ATARAX/VISTARIL) 50 MG tablet Take 50 mg by mouth at bedtime.    Marland Kitchen oxyCODONE (ROXICODONE) 15 MG immediate release tablet Take 1 tablet (15 mg total) by mouth 4 (four) times daily as needed. 120 tablet 0  . traZODone (DESYREL) 100 MG tablet Take 2 tablets (200 mg total) by mouth at bedtime. 60 tablet 3  . zonisamide (ZONEGRAN) 100 MG capsule Take 2 capsules (200 mg total) by mouth at bedtime. 60 capsule 4   No current facility-administered medications on file prior to visit.     Review of Systems:  Review of Systems  Constitutional: Negative for chills, diaphoresis and fever.       Positive for ~50lb weight gain in the past 9 months.  HENT: Negative for congestion, ear discharge, ear pain, hearing loss, nosebleeds, sinus pain, sore throat and tinnitus.   Eyes: Negative for blurred vision, double vision, photophobia, pain, discharge and redness.  Respiratory: Negative for cough, hemoptysis, sputum production, shortness of  breath, wheezing and stridor.   Cardiovascular: Positive for leg swelling. Negative for chest pain, palpitations, orthopnea, claudication and PND.  Gastrointestinal: Positive for constipation. Negative for abdominal pain, blood in stool, diarrhea, heartburn, melena, nausea and vomiting.  Genitourinary: Negative for dysuria, flank pain, frequency, hematuria and urgency.  Musculoskeletal: Positive for back pain, joint pain, myalgias and neck pain. Negative for falls.  Skin: Negative for itching and rash.  Neurological: Negative for dizziness, speech change, focal weakness, seizures and headaches.  Psychiatric/Behavioral: Positive for depression. Negative for hallucinations, memory loss, substance abuse and suicidal ideas. The patient is nervous/anxious.  Physical Examination: Vitals:   11/26/16 1626  BP: 113/72  Pulse: 96  Temp: 97.9 F (36.6 C)   Vitals:   11/26/16 1626  Weight: 171 lb 6.4 oz (77.7 kg)  Height: 5\' 4"  (1.626 m)   Body mass index is 29.42 kg/m. Ideal Body Weight: Weight in (lb) to have BMI = 25: 145.3  Physical Examination: General appearance - alert, well appearing, and in no distress, oriented to person, place, and time and overweight Mental status - alert, oriented to person, place, and time, normal mood, behavior, speech, dress, motor activity, and thought processes Eyes - pupils equal and reactive, extraocular eye movements intact Ears - bilateral TM's and external ear canals normal Nose - normal and patent, no erythema, discharge or polyps Mouth - mucous membranes moist, pharynx normal without lesions Neck - thyroid exam: thyroid is normal in size without nodules or tenderness, small nodule on left Chest - clear to auscultation, no wheezes, rales or rhonchi, symmetric air entry, no tachypnea, retractions or cyanosis Heart - normal rate, regular rhythm, normal S1, S2, no murmurs, rubs, clicks or gallops Abdomen - soft, nontender, nondistended, no masses or  organomegaly Neurological - alert, oriented, normal speech, no focal findings or movement disorder noted Musculoskeletal - slow moving, pain in back and neck Extremities - peripheral pulses normal, no pedal edema, no clubbing or cyanosis Skin - normal coloration and turgor, no rashes, no suspicious skin lesions noted   Assessment and Plan: Weight gain - Plan: TSH  Lower extremity edema - Plan: CBC, Comprehensive metabolic panel  Gastroesophageal reflux disease, esophagitis presence not specified - Plan: ranitidine (ZANTAC) 150 MG tablet  Here today to establish care and go over a couple of concerns She has GERD and we will try zantac prn for same She has noted some LE edema and weight gain.  Do not think that she has CHF as she has no other sx to suggest this.  However will get basic labs for her today as above to look for any other cause of these changes Labs today: TSH, CBC, CMP If kidney function looks good, can start PRN HCTZ or Lasix for swelling. Will plan further follow- up pending labs.   Meds ordered this encounter  Medications  . ranitidine (ZANTAC) 150 MG tablet    Sig: Take 1 tablet (150 mg total) by mouth 2 (two) times daily. Use as needed for GERD    Dispense:  60 tablet    Refill:  6     Patient instructions:  It was nice to see you today- take care and I will be in touch with your labs asap We will look for anything that may be causing your weight gain or swelling Assuming your kidneys are ok, we can try an as needed fluid pill for when you are swollen.  Take care!    Signed Lamar Blinks, MD

## 2016-11-26 NOTE — Progress Notes (Signed)
Pre visit review using our clinic review tool, if applicable. No additional management support is needed unless otherwise documented below in the visit note. 

## 2016-11-27 LAB — COMPREHENSIVE METABOLIC PANEL
ALK PHOS: 138 U/L — AB (ref 39–117)
ALT: 19 U/L (ref 0–35)
AST: 12 U/L (ref 0–37)
Albumin: 3.8 g/dL (ref 3.5–5.2)
BUN: 8 mg/dL (ref 6–23)
CO2: 31 meq/L (ref 19–32)
Calcium: 9.1 mg/dL (ref 8.4–10.5)
Chloride: 105 mEq/L (ref 96–112)
Creatinine, Ser: 0.75 mg/dL (ref 0.40–1.20)
GFR: 84.32 mL/min (ref >60.00)
Glucose, Bld: 84 mg/dL (ref 70–99)
POTASSIUM: 3.9 meq/L (ref 3.5–5.1)
SODIUM: 140 meq/L (ref 135–145)
TOTAL PROTEIN: 6.6 g/dL (ref 6.0–8.3)
Total Bilirubin: 0.3 mg/dL (ref 0.2–1.2)

## 2016-11-27 LAB — CBC
HEMATOCRIT: 40.8 % (ref 36.0–46.0)
Hemoglobin: 13.8 g/dL (ref 12.0–15.0)
MCHC: 33.8 g/dL (ref 30.0–36.0)
MCV: 91.1 fl (ref 78.0–100.0)
Platelets: 229 10*3/uL (ref 150.0–400.0)
RBC: 4.48 Mil/uL (ref 3.87–5.11)
RDW: 14.3 % (ref 11.5–15.5)
WBC: 6.8 10*3/uL (ref 4.0–10.5)

## 2016-11-27 LAB — TSH: TSH: 1.58 u[IU]/mL (ref 0.35–4.50)

## 2016-11-28 ENCOUNTER — Encounter: Payer: Self-pay | Admitting: Family Medicine

## 2016-12-08 ENCOUNTER — Encounter: Payer: Medicare Other | Attending: Physical Medicine & Rehabilitation | Admitting: Physical Medicine & Rehabilitation

## 2016-12-08 ENCOUNTER — Encounter: Payer: Self-pay | Admitting: Physical Medicine & Rehabilitation

## 2016-12-08 VITALS — BP 119/80 | HR 106

## 2016-12-08 DIAGNOSIS — M1288 Other specific arthropathies, not elsewhere classified, other specified site: Secondary | ICD-10-CM

## 2016-12-08 DIAGNOSIS — Z9889 Other specified postprocedural states: Secondary | ICD-10-CM | POA: Insufficient documentation

## 2016-12-08 DIAGNOSIS — M75102 Unspecified rotator cuff tear or rupture of left shoulder, not specified as traumatic: Secondary | ICD-10-CM | POA: Diagnosis not present

## 2016-12-08 DIAGNOSIS — M79605 Pain in left leg: Secondary | ICD-10-CM

## 2016-12-08 DIAGNOSIS — G8929 Other chronic pain: Secondary | ICD-10-CM | POA: Insufficient documentation

## 2016-12-08 DIAGNOSIS — M7062 Trochanteric bursitis, left hip: Secondary | ICD-10-CM

## 2016-12-08 DIAGNOSIS — M961 Postlaminectomy syndrome, not elsewhere classified: Secondary | ICD-10-CM | POA: Diagnosis not present

## 2016-12-08 DIAGNOSIS — G5690 Unspecified mononeuropathy of unspecified upper limb: Secondary | ICD-10-CM

## 2016-12-08 DIAGNOSIS — M4302 Spondylolysis, cervical region: Secondary | ICD-10-CM | POA: Diagnosis present

## 2016-12-08 DIAGNOSIS — G5603 Carpal tunnel syndrome, bilateral upper limbs: Secondary | ICD-10-CM

## 2016-12-08 DIAGNOSIS — M542 Cervicalgia: Secondary | ICD-10-CM

## 2016-12-08 DIAGNOSIS — M792 Neuralgia and neuritis, unspecified: Secondary | ICD-10-CM

## 2016-12-08 DIAGNOSIS — M545 Low back pain: Secondary | ICD-10-CM

## 2016-12-08 DIAGNOSIS — G894 Chronic pain syndrome: Secondary | ICD-10-CM

## 2016-12-08 DIAGNOSIS — M47816 Spondylosis without myelopathy or radiculopathy, lumbar region: Secondary | ICD-10-CM

## 2016-12-08 MED ORDER — OXYCODONE HCL 15 MG PO TABS: 15.0000 mg | ORAL_TABLET | Freq: Four times a day (QID) | ORAL | 0 refills | Status: DC | PRN

## 2016-12-08 NOTE — Patient Instructions (Signed)
WATCH YOUR SLEEPING POSITIONS, OBSERVE COMPRESSION OF YOUR LEFT SHOULDER, ELBOW AND WRISTS WHILE YOU SLEEP   PLEASE FEEL FREE TO CALL OUR OFFICE WITH ANY PROBLEMS OR QUESTIONS [id number])

## 2016-12-08 NOTE — Progress Notes (Signed)
Subjective:    Patient ID: Allison Lee, female    DOB: 11/29/1955, 61 y.o.   MRN: [id number]  HPI   Acsa is here in follow up of her chronic pain. We performed bilateral UE NCS last month which were normal. She has continued to have numbness most often in her ulnar hands when she sleeps. She denies symptoms during the day and the numbness usually resolved over a few minutes after she awakens.   She continues to complain about shoulder pain on the left which is symptomatic when she uses her left arm for basic activities around the house. Left shoulder is also tender when she sleeps. She often sleeps on her side.   She remains on her oxycodone for pain control which provides some reilef. She also thinks the zonegran has helped her pain.   I reviewed her lumbar and cervical CT's from 2016 which demonstrate stable surgery site and multilevel lumbar spondylosis with degenerative disc disease.   Pain Inventory Average Pain 7 Pain Right Now 8 My pain is sharp, burning, dull, stabbing, tingling and aching  In the last 24 hours, has pain interfered with the following? General activity 8 Relation with others 8 Enjoyment of life 9 What TIME of day is your pain at its worst? all Sleep (in general) Poor  Pain is worse with: walking, bending, sitting and . Pain improves with: rest and medication Relief from Meds: 4  Mobility walk with assistance use a cane ability to climb steps?  yes do you drive?  yes  Function disabled: date disabled 2005 I need assistance with the following:  dressing, meal prep, household duties and shopping  Neuro/Psych weakness numbness tingling trouble walking spasms depression anxiety  Prior Studies Any changes since last visit?  no  Physicians involved in your care Any changes since last visit?  no   Family History  Problem Relation Age of Onset  . COPD Mother   . Cancer Mother   . Dementia Mother   . Cancer Father   . CAD Father 54   . Cancer Brother   . Cirrhosis Brother    Social History   Social History  . Marital status: Married    Spouse name: N/A  . Number of children: 2  . Years of education: N/A   Occupational History  . DISABLED Unemployed   Social History Main Topics  . Smoking status: Current Every Day Smoker    Packs/day: 1.00    Years: 35.00    Types: Cigarettes  . Smokeless tobacco: Never Used     Comment: trying to cut back  . Alcohol use No  . Drug use: No  . Sexual activity: Not on file   Other Topics Concern  . Not on file   Social History Narrative   Lives at home with husband   Past Surgical History:  Procedure Laterality Date  . CERVICAL SPINE SURGERY     x 2  . FOOT SURGERY  2009   Past Medical History:  Diagnosis Date  . Arthritis   . COPD (chronic obstructive pulmonary disease) ([place])   . Dysthymia   . GERD (gastroesophageal reflux disease)   . IBS (irritable bowel syndrome)   . Osteoporosis   . Other chronic pain   . Smoker    There were no vitals taken for this visit.  Opioid Risk Score:   Fall Risk Score:  `1  Depression screen PHQ 2/9  Depression screen [facility] 2/9 07/21/2016 06/23/2016  Decreased  Interest - 3  Down, Depressed, Hopeless - 3  PHQ - 2 Score - 6  Altered sleeping 3 3  Tired, decreased energy 3 3  Change in appetite - 0  Feeling bad or failure about yourself  - 2  Trouble concentrating - 2  Moving slowly or fidgety/restless - 2  Suicidal thoughts - 0  PHQ-9 Score - 18  Difficult doing work/chores - Very difficult   Review of Systems  HENT: Negative.   Eyes: Negative.   Respiratory: Negative.   Cardiovascular: Negative.   Gastrointestinal: Negative.   Endocrine: Negative.   Genitourinary: Negative.   Musculoskeletal: Positive for arthralgias, back pain and gait problem.  Skin: Negative.   Allergic/Immunologic: Negative.   Neurological: Positive for weakness and numbness.  Hematological: Negative.   Psychiatric/Behavioral: Positive  for dysphoric mood. The patient is nervous/anxious.   All other systems reviewed and are negative.      Objective:   Physical Exam  General: Alert and oriented x 3, No apparent distress. Normal build. Smells of tobacco smoke. HEENT:PERRL  Neck:Supple without JVD or lymphadenopathy Heart: RRR. Chest: CTA Abdomen:Soft, non-tender, non-distended, bowel sounds positive. Extremities:No clubbing, cyanosis, or tr LE edema. Pulses are 2+ Skin:Clean and intact without signs of breakdown Neuro:Pt is cognitively appropriate with normal insight, memory, and awareness. Cranial nerves 2-12 are intact. Sensory exam is normal. Reflexes are 1+ in all 4's. Fine motor coordination is grosslyintact.   Motor function is grossly 4/5 to 5/5 in the UE's with some pain inhibition in left arm and to an extent in the left leg. LE: 4/5 prox to distal with pain inhibition also. sensed pain and LT in all 4's. Tinel's and Phalens tests remains positive in both wrist.  Musculoskeletal:Cervical ROM  limited in all planes today including with flexion and extension.  . Low back reveals mild lumbar levoscoliosis and prominence of the left lumbar musculature with persistent tilt to the right.. She remainstender in the bilateral PSIS areas and lesser so to palpation in the low back. She could bend to only 45-50 degrees, extension was limited to 5-10 degrees and lateral bending and rotation wer limited to about 10 degrees---all movements provoked pain again today. She walks with antalgia favoring the left hip and uses cane to offload. Gait more balanced today.  Psych:Pt's affect is appropriate. Pt is cooperative      Assessment & Plan:  1. Cervical spondylolysis with chronic neck pain and decreased range of motion, chronic left upper extremity pain. Patient is s/p cervical spine surgery per Dr Patrice Paradise on Mar 09, 2012. No notes available regarding procedure.  2. Lumbar degenerative disc disease as well as facet  arthropathy and chronic low back pain. Facets appear most symptomatic.  -continue facet stretches/HEP             -CT's reviewed with patient             -may wear a lumbar corset occasionally for breakthorugh pain.  3. 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              -increased zonegran to 200mg  at HS--consider further titration 4. Left greater troch bursitis.  -continue Ice -stretching reviewed.  5. Left RTC syndrome- persistent pain  -ordered xrays to assess anatomy  -After informed consent and preparation of the skin with betadine and isopropyl alcohol, I injected 6mg  (1cc) of celestone and 4cc of 1% lidocaine into the left subacromial space via lateral approach. Additionally, aspiration was performed prior  to injection. The patient tolerated well, and no complications were encountered. Afterward the area was cleaned and dressed. Post- injection instructions were provided.   Basic ROM 6. Chronic pain syndrome  Refilled oxycodone 15mg  4x daily #120 -maintainflexeril to 10mg  TID - "holistic mgt" of her pain. -smoking cessation was discussed 7. Depression/anxiety---continue psych care. Needs to work on better coping strategies. -consider titrating klonopin to 1mg  q12 8. Recent fall: ?exacerbation of CTS             -NCS normal             -bilateral CTS splints--wear at night             -discussed apppropriate positioning in bed to avoid compression of wrist/elbow 9. Lower ext edema: elevation, TEDs, off meloxicam             -follow up with PCP   44minutes of face to face patient care time were spent during this visit. All questions were encouraged and answered. Follow up in a month with me or NP

## 2016-12-22 ENCOUNTER — Ambulatory Visit
Admission: RE | Admit: 2016-12-22 | Discharge: 2016-12-22 | Disposition: A | Discharge status: Home / Self Care | Payer: Medicare Other | Source: Ambulatory Visit | Attending: Physical Medicine & Rehabilitation | Admitting: Physical Medicine & Rehabilitation

## 2016-12-22 DIAGNOSIS — M75102 Unspecified rotator cuff tear or rupture of left shoulder, not specified as traumatic: Secondary | ICD-10-CM

## 2016-12-23 ENCOUNTER — Telehealth: Payer: Self-pay | Admitting: Physical Medicine & Rehabilitation

## 2016-12-23 NOTE — Telephone Encounter (Signed)
Please let mrs Allison Lee know that shoulder xray reveals no arthritis or degenerative changes. thx

## 2016-12-23 NOTE — Telephone Encounter (Signed)
Contacted patient and left detailed message regarding x-ray results.  Patient signed a DPR authorizing detailed message on an answering device

## 2017-01-05 ENCOUNTER — Telehealth: Payer: Self-pay | Admitting: Registered Nurse

## 2017-01-05 ENCOUNTER — Encounter: Payer: Medicare Other | Attending: Physical Medicine & Rehabilitation | Admitting: Registered Nurse

## 2017-01-05 ENCOUNTER — Encounter: Payer: Self-pay | Admitting: Registered Nurse

## 2017-01-05 VITALS — BP 128/87 | HR 104 | Resp 14

## 2017-01-05 DIAGNOSIS — Z9889 Other specified postprocedural states: Secondary | ICD-10-CM | POA: Diagnosis not present

## 2017-01-05 DIAGNOSIS — Z5181 Encounter for therapeutic drug level monitoring: Secondary | ICD-10-CM

## 2017-01-05 DIAGNOSIS — M79605 Pain in left leg: Secondary | ICD-10-CM

## 2017-01-05 DIAGNOSIS — M7062 Trochanteric bursitis, left hip: Secondary | ICD-10-CM

## 2017-01-05 DIAGNOSIS — M4302 Spondylolysis, cervical region: Secondary | ICD-10-CM | POA: Diagnosis present

## 2017-01-05 DIAGNOSIS — G5603 Carpal tunnel syndrome, bilateral upper limbs: Secondary | ICD-10-CM | POA: Diagnosis not present

## 2017-01-05 DIAGNOSIS — G8929 Other chronic pain: Secondary | ICD-10-CM | POA: Diagnosis not present

## 2017-01-05 DIAGNOSIS — M5416 Radiculopathy, lumbar region: Secondary | ICD-10-CM | POA: Diagnosis not present

## 2017-01-05 DIAGNOSIS — G894 Chronic pain syndrome: Secondary | ICD-10-CM | POA: Diagnosis not present

## 2017-01-05 DIAGNOSIS — M961 Postlaminectomy syndrome, not elsewhere classified: Secondary | ICD-10-CM | POA: Diagnosis not present

## 2017-01-05 DIAGNOSIS — M542 Cervicalgia: Secondary | ICD-10-CM

## 2017-01-05 DIAGNOSIS — M47816 Spondylosis without myelopathy or radiculopathy, lumbar region: Secondary | ICD-10-CM

## 2017-01-05 DIAGNOSIS — M1288 Other specific arthropathies, not elsewhere classified, other specified site: Secondary | ICD-10-CM

## 2017-01-05 DIAGNOSIS — M545 Low back pain: Secondary | ICD-10-CM

## 2017-01-05 DIAGNOSIS — Z79899 Other long term (current) drug therapy: Secondary | ICD-10-CM

## 2017-01-05 MED ORDER — OXYCODONE HCL 15 MG PO TABS: 15.0000 mg | ORAL_TABLET | Freq: Four times a day (QID) | ORAL | 0 refills | Status: DC | PRN

## 2017-01-05 NOTE — Progress Notes (Signed)
Subjective:    Patient ID: Allison Lee, female    DOB: [birth year], 61 y.o.   MRN: [id number]  HPI: [name] is a 61 year old female who returns for follow up appointment and medication refill. She states her pain is located in her neck radiating into her left shoulder and lower back radiating in her left  lower extremity laterally and anteriorly. She rates her pain 8. Her current exercise regime is performing stretching exercises and walking.  Also states she fell two weeks ago, she was trying to close the screen door when her left lower extremity gave way and she fell backwards and landed on her back. She was able to pick herself up, she didn't seek medical attention. She didn't have her cane with her during the fall, educated on falls prevention she verbalizes understanding.  Continue with smoking cessation, she's down to 7 cigarettes a day, she was unable to obtain Chantix due to the cost of $400.00 , also states she will purchase Nicotine Patches.   S/P Right Shoulder Injection with no relief noted she states.   Pain Inventory Average Pain 7 Pain Right Now 8 My pain is constant, sharp, burning, dull, stabbing, tingling and aching  In the last 24 hours, has pain interfered with the following? General activity 8 Relation with others 7 Enjoyment of life 9 What TIME of day is your pain at its worst? all Sleep (in general) Poor  Pain is worse with: walking, bending, sitting, inactivity, standing and some activites Pain improves with: rest and medication Relief from Meds: 6  Mobility walk with assistance use a cane how many minutes can you walk? 3-5 ability to climb steps?  yes do you drive?  yes transfers alone  Function disabled: date disabled 2005 I need assistance with the following:  dressing, meal prep, household duties and shopping  Neuro/Psych weakness numbness tingling trouble walking spasms depression anxiety  Prior Studies Any changes since  last visit?  no  Physicians involved in your care Any changes since last visit?  no   Family History  Problem Relation Age of Onset  . COPD Mother   . Cancer Mother   . Dementia Mother   . Cancer Father   . CAD Father 55  . Cancer Brother   . Cirrhosis Brother    Social History   Social History  . Marital status: Married    Spouse name: N/A  . Number of children: 2  . Years of education: N/A   Occupational History  . DISABLED Unemployed   Social History Main Topics  . Smoking status: Current Every Day Smoker    Packs/day: 1.00    Years: 35.00    Types: Cigarettes  . Smokeless tobacco: Never Used     Comment: trying to cut back  . Alcohol use No  . Drug use: No  . Sexual activity: Not Asked   Other Topics Concern  . None   Social History Narrative   Lives at home with husband   Past Surgical History:  Procedure Laterality Date  . CERVICAL SPINE SURGERY     x 2  . FOOT SURGERY  2009   Past Medical History:  Diagnosis Date  . Arthritis   . COPD (chronic obstructive pulmonary disease) ([place])   . Dysthymia   . GERD (gastroesophageal reflux disease)   . IBS (irritable bowel syndrome)   . Osteoporosis   . Other chronic pain   . Smoker    BP  128/87 (BP Location: Right Arm, Patient Position: Sitting, Cuff Size: Large)   Pulse (!) 104   Resp 14   SpO2 94%   Opioid Risk Score:   Fall Risk Score:  `1  Depression screen PHQ 2/9  Depression screen [facility] 2/9 07/21/2016 06/23/2016  Decreased Interest - 3  Down, Depressed, Hopeless - 3  PHQ - 2 Score - 6  Altered sleeping 3 3  Tired, decreased energy 3 3  Change in appetite - 0  Feeling bad or failure about yourself  - 2  Trouble concentrating - 2  Moving slowly or fidgety/restless - 2  Suicidal thoughts - 0  PHQ-9 Score - 18  Difficult doing work/chores - Very difficult    Review of Systems  Constitutional: Positive for unexpected weight change.  HENT: Negative.   Eyes: Negative.   Respiratory:  Positive for shortness of breath.   Cardiovascular: Positive for leg swelling.  Gastrointestinal: Positive for constipation.  Endocrine: Negative.   Genitourinary: Negative.   Musculoskeletal: Positive for arthralgias, back pain, gait problem, myalgias and neck pain.       Spasms   Skin: Negative.   Allergic/Immunologic: Negative.   Neurological: Positive for weakness and numbness.       Tingling  Hematological: Negative.   Psychiatric/Behavioral: Positive for dysphoric mood. The patient is nervous/anxious.   All other systems reviewed and are negative.      Objective:   Physical Exam  Constitutional: She is oriented to person, place, and time. She appears well-developed and well-nourished.  HENT:  Head: Normocephalic and atraumatic.  Neck: Normal range of motion. Neck supple.  Cervical Paraspinal Tenderness: C-5-C-6  Cardiovascular: Normal rate and regular rhythm.   Pulmonary/Chest: Effort normal and breath sounds normal.  Musculoskeletal:  Normal Muscle Bulk and Muscle Testing Reveals: Upper Extremities: Full ROM and Muscle Strength 5/5 Bilateral AC Joint Tenderness: L>R Thoracic Paraspinal Tenderness: T-1-T-3  T-7-T-9 Mainly Left Side Lumbar Paraspinal Tenderness: L-3-L-5 Lower Extremities: Full ROM and Muscle Strength 5/5 Arises from Table with ease Narrow Based Gait    Neurological: She is alert and oriented to person, place, and time.  Skin: Skin is warm and dry.  Psychiatric: She has a normal mood and affect.  Nursing note and vitals reviewed.         Assessment & Plan:  1. Cervical spondylolysis with chronic neck pain/ Cervical Radiculopathy: She hasdecreased range of motion, chronic left upper extremity pain. Patient s/p cervical spine surgery per Dr Patrice Paradise on Mar 09, 2012.  Continue Zonegran. 01/05/2017 2. Lumbar degenerative disc disease as well as facet arthropathy and chronic low back pain. Continue HEP. 01/05/2017 3. Neuropathic left upper extremity  and left lower extremity pain: Continue Zonegran. 01/05/2017 4. Left Greater trochantericbursitis: No complaints today. Continue Ice Therapy and HEP. 01/05/2017 5. Chronic pain syndrome: Continue Flexeril and Mobic. 01/05/2017 Refilled oxycodone 15mg  one tablet QID as needed #120.  We will continue the opioid monitoring program, this consists of regular clinic visits, examinations, urine drug screen, pill counts as well as use of [place] Controlled Substance Reporting System.  7. Depression/anxiety-: Psychiatry Following: Continue Klonopin and Cymbalta.01/05/2017  20 minutes of face to face patient care time was spent during this visit. All questions were encouraged and answered.  F/U in 1 month

## 2017-01-05 NOTE — Telephone Encounter (Signed)
On 01/05/2017 the [place] was reviewed no conflict was seen on the [place] with multiple prescribers. Ms. Bromberg has a signed narcotic contract with our office. If there were any discrepancies this would have been reported to her physician.

## 2017-01-18 ENCOUNTER — Other Ambulatory Visit: Payer: Self-pay | Admitting: Family Medicine

## 2017-01-18 ENCOUNTER — Telehealth: Payer: Self-pay | Admitting: Internal Medicine

## 2017-01-18 DIAGNOSIS — R600 Localized edema: Secondary | ICD-10-CM

## 2017-01-18 MED ORDER — FUROSEMIDE 20 MG PO TABS: 20.0000 mg | ORAL_TABLET | Freq: Every day | ORAL | 3 refills | Status: DC

## 2017-01-18 NOTE — Telephone Encounter (Signed)
Called her back- she continues to have some LE edema of her feet and legs which can be really bothersome. She would like some PRN lasix which is certainly ok- will rx this for her to use prn, she will see me next month for a recheck  Lytes were normal in January of this year

## 2017-01-18 NOTE — Telephone Encounter (Signed)
°  Relation to KA:JGOT Call back number:[phone] Pharmacy: [facility]  Reason for call: pt states Dr. Lorelei Pont informed her to contact her if she continued to have swelling and she would call her in a rx for it, pt states she still has some swelling and would like for dr. Lorelei Pont to send her in something pt states she can not take the HCTZ

## 2017-02-02 ENCOUNTER — Encounter: Payer: Medicare Other | Admitting: Registered Nurse

## 2017-02-08 ENCOUNTER — Encounter: Payer: Medicare Other | Attending: Physical Medicine & Rehabilitation | Admitting: Registered Nurse

## 2017-02-08 ENCOUNTER — Encounter: Payer: Self-pay | Admitting: Registered Nurse

## 2017-02-08 VITALS — BP 106/83 | HR 118 | Resp 14

## 2017-02-08 DIAGNOSIS — M4302 Spondylolysis, cervical region: Secondary | ICD-10-CM | POA: Diagnosis present

## 2017-02-08 DIAGNOSIS — M1288 Other specific arthropathies, not elsewhere classified, other specified site: Secondary | ICD-10-CM | POA: Diagnosis not present

## 2017-02-08 DIAGNOSIS — M542 Cervicalgia: Secondary | ICD-10-CM | POA: Diagnosis not present

## 2017-02-08 DIAGNOSIS — G894 Chronic pain syndrome: Secondary | ICD-10-CM | POA: Diagnosis not present

## 2017-02-08 DIAGNOSIS — G8929 Other chronic pain: Secondary | ICD-10-CM | POA: Insufficient documentation

## 2017-02-08 DIAGNOSIS — Z79899 Other long term (current) drug therapy: Secondary | ICD-10-CM

## 2017-02-08 DIAGNOSIS — M5416 Radiculopathy, lumbar region: Secondary | ICD-10-CM | POA: Diagnosis not present

## 2017-02-08 DIAGNOSIS — Z9889 Other specified postprocedural states: Secondary | ICD-10-CM | POA: Insufficient documentation

## 2017-02-08 DIAGNOSIS — M7062 Trochanteric bursitis, left hip: Secondary | ICD-10-CM | POA: Diagnosis not present

## 2017-02-08 DIAGNOSIS — M47816 Spondylosis without myelopathy or radiculopathy, lumbar region: Secondary | ICD-10-CM

## 2017-02-08 DIAGNOSIS — M961 Postlaminectomy syndrome, not elsewhere classified: Secondary | ICD-10-CM

## 2017-02-08 DIAGNOSIS — M5412 Radiculopathy, cervical region: Secondary | ICD-10-CM

## 2017-02-08 DIAGNOSIS — Z5181 Encounter for therapeutic drug level monitoring: Secondary | ICD-10-CM

## 2017-02-08 MED ORDER — OXYCODONE HCL 15 MG PO TABS: 15.0000 mg | ORAL_TABLET | Freq: Four times a day (QID) | ORAL | 0 refills | Status: DC | PRN

## 2017-02-08 NOTE — Progress Notes (Signed)
Subjective:    Patient ID: Allison Lee, female    DOB: 1956-09-28, 61 y.o.   MRN: [id number]  HPI: Allison Lee is a 61 year old female who returns for follow up appointment and medication refill. She states her pain is located in her neck radiating into her left shoulder, left arm, left hand andlower back pain radiating in her left lower extremity laterally and anteriorly. She rates her pain 8. Her current exercise regime is performing stretching exercises and walking.   Also states she fell two weeks ago, she was walking up her porch stairs when her left leg gave out, she landed on her left knee. Her husband helped her up, she states she had her cane with her. She will follow up with her surgeon she states. Also states she has been caring for her mother-in-law 7 days a week, and knows she it has caused intensity of pain and stress. We spoke about tolerance and have a family meeting to divide the care responsibilities, she verbalizes understanding. She also has a Social worker and psychiatrist.    s. Awad last UDS was on 09/18/2016 it was consistent.  Pain Inventory Average Pain 9 Pain Right Now 8 My pain is constant, sharp, burning, dull, stabbing, tingling and aching  In the last 24 hours, has pain interfered with the following? General activity 8 Relation with others 7 Enjoyment of life 8 What TIME of day is your pain at its worst? all Sleep (in general) Poor  Pain is worse with: walking, bending, sitting and standing Pain improves with: rest and medication Relief from Meds: 7  Mobility use a cane how many minutes can you walk? 3-5 ability to climb steps?  yes do you drive?  yes  Function disabled: date disabled 2005  Neuro/Psych weakness numbness tingling trouble walking spasms depression anxiety  Prior Studies Any changes since last visit?  no  Physicians involved in your care Any changes since last visit?  no   Family History  Problem Relation  Age of Onset  . COPD Mother   . Cancer Mother   . Dementia Mother   . Cancer Father   . CAD Father 31  . Cancer Brother   . Cirrhosis Brother    Social History   Social History  . Marital status: Married    Spouse name: N/A  . Number of children: 2  . Years of education: N/A   Occupational History  . DISABLED Unemployed   Social History Main Topics  . Smoking status: Current Every Day Smoker    Packs/day: 1.00    Years: 35.00    Types: Cigarettes  . Smokeless tobacco: Never Used     Comment: trying to cut back  . Alcohol use No  . Drug use: No  . Sexual activity: Not Asked   Other Topics Concern  . None   Social History Narrative   Lives at home with husband   Past Surgical History:  Procedure Laterality Date  . CERVICAL SPINE SURGERY     x 2  . FOOT SURGERY  2009   Past Medical History:  Diagnosis Date  . Arthritis   . COPD (chronic obstructive pulmonary disease) ([place])   . Dysthymia   . GERD (gastroesophageal reflux disease)   . IBS (irritable bowel syndrome)   . Osteoporosis   . Other chronic pain   . Smoker    BP 106/83   Pulse (!) 118   Resp 14   SpO2 96%  Opioid Risk Score:   Fall Risk Score:  `1  Depression screen PHQ 2/9  Depression screen [facility] 2/9 02/08/2017 07/21/2016 06/23/2016  Decreased Interest 3 - 3  Down, Depressed, Hopeless 3 - 3  PHQ - 2 Score 6 - 6  Altered sleeping - 3 3  Tired, decreased energy - 3 3  Change in appetite - - 0  Feeling bad or failure about yourself  - - 2  Trouble concentrating - - 2  Moving slowly or fidgety/restless - - 2  Suicidal thoughts - - 0  PHQ-9 Score - - 18  Difficult doing work/chores - - Very difficult   Review of Systems  Constitutional: Positive for unexpected weight change.  HENT: Negative.   Eyes: Negative.   Respiratory: Positive for shortness of breath.   Cardiovascular: Positive for leg swelling.  Gastrointestinal: Positive for constipation.  Endocrine: Negative.   Genitourinary:  Negative.   Musculoskeletal: Positive for back pain and gait problem.       Spasms  Skin: Negative.   Allergic/Immunologic: Negative.   Neurological: Positive for weakness and numbness.       Tingling  Hematological: Negative.   Psychiatric/Behavioral: Positive for dysphoric mood. The patient is nervous/anxious.   All other systems reviewed and are negative.      Objective:   Physical Exam  Constitutional: She is oriented to person, place, and time. She appears well-developed and well-nourished.  HENT:  Head: Normocephalic and atraumatic.  Neck: Normal range of motion. Neck supple.  Cervical Paraspinal Tenderness: C-5-C-6  Cardiovascular: Normal rate and regular rhythm.   Pulmonary/Chest: Effort normal and breath sounds normal.  Musculoskeletal:  Normal Muscle Bulk and Muscle Testing Reveals: Upper Extremities: Full ROM and Muscle Strength on right 5/5 and Left 4/5 Left AC Joint Tenderness Thoracic Paraspinal Tenderness: T-1-T-3 Mainly Left Side Lumbar Paraspinal Tenderness: L-3-L-5 Lower Extremities: Right: Full ROM and Muscle Strength 5/5 Left: Decreased ROM and Muscle Strength 4/5 Arises from Table slowly using straight cane for support Antalgic Gait   Neurological: She is alert and oriented to person, place, and time.  Skin: Skin is warm and dry.  Psychiatric: She has a normal mood and affect.          Assessment & Plan:  1. Cervical spondylolysis with chronic neck pain/ Cervical Radiculopathy: She hasdecreased range of motion, chronic left upper extremity pain. Patient s/p cervical spine surgery per Dr Patrice Paradise on Mar 09, 2012.  Continue Zonegran. 02/08/2017 2. Lumbar degenerative disc disease as well as facet arthropathy and chronic low back pain. Continue HEP. 02/08/2017 3. 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 02/08/2017 4. Left Greater trochantericbursitis: Continue Ice Therapy and HEP. 02/08/2017 5. Chronic pain  syndrome: Continue Flexeril and Mobic. 02/08/2017 Refilled oxycodone 15mg  one tablet QID as needed #120.  We will continue the opioid monitoring program, this consists of regular clinic visits, examinations, urine drug screen, pill counts as well as use of [place] Controlled Substance Reporting System.  7. Depression/anxiety-: Psychiatry Following: Continue Klonopin and Cymbalta.02/08/2017  20 minutes of face to face patient care time was spent during this visit. All questions were encouraged and answered.   F/U in 1 month

## 2017-02-17 LAB — TOXASSURE SELECT,+ANTIDEPR,UR

## 2017-02-18 ENCOUNTER — Telehealth: Payer: Self-pay | Admitting: *Deleted

## 2017-02-18 NOTE — Telephone Encounter (Signed)
Urine drug screen for this encounter is consistent for prescribed narcotic medication.

## 2017-02-25 ENCOUNTER — Ambulatory Visit (INDEPENDENT_AMBULATORY_CARE_PROVIDER_SITE_OTHER): Payer: Medicare Other | Admitting: Family Medicine

## 2017-02-25 VITALS — BP 108/82 | HR 104 | Temp 98.0°F | Ht 64.0 in | Wt 168.6 lb

## 2017-02-25 DIAGNOSIS — Z1159 Encounter for screening for other viral diseases: Secondary | ICD-10-CM | POA: Diagnosis not present

## 2017-02-25 DIAGNOSIS — E041 Nontoxic single thyroid nodule: Secondary | ICD-10-CM

## 2017-02-25 DIAGNOSIS — Z5181 Encounter for therapeutic drug level monitoring: Secondary | ICD-10-CM | POA: Diagnosis not present

## 2017-02-25 DIAGNOSIS — Z23 Encounter for immunization: Secondary | ICD-10-CM | POA: Diagnosis not present

## 2017-02-25 DIAGNOSIS — R600 Localized edema: Secondary | ICD-10-CM | POA: Diagnosis not present

## 2017-02-25 DIAGNOSIS — Z13220 Encounter for screening for lipoid disorders: Secondary | ICD-10-CM | POA: Diagnosis not present

## 2017-02-25 MED ORDER — FUROSEMIDE 20 MG PO TABS: ORAL_TABLET | ORAL | 3 refills | Status: DC

## 2017-02-25 NOTE — Progress Notes (Signed)
[place] [facility] [address] [place], [place] [address] [phone]  Date:  02/25/2017   Name:  Allison Lee   DOB:  July 24, 1956   MRN:  [id number]  PCP:  Guadlupe Spanish, MD    Chief Complaint: Follow-up (Pt here to f/u up on weight gain. Still taking Lasix and tolerating well. Zantac not helping with acid reflux. )   History of Present Illness:  Allison Lee is a 61 y.o. very pleasant female patient who presents with the following:  Last seen by myself in January of this year.  She has a history of spine problems and operations, resultant chronic pain  Wt Readings from Last 3 Encounters:  02/25/17 168 lb 9.6 oz (76.5 kg)  11/26/16 171 lb 6.4 oz (77.7 kg)  08/25/14 127 lb 10.3 oz (57.9 kg)   We did a CMP, CBC, TSH for her at last visit.  All except alk phos were normal   She notes that "I ain't been feeling so good this week," she had a GI bug earlier this week with vomiting.  This is now much better No fever, no diarrhea She is eating again now   She is still having reflux- the medication we tried did not help She has noted reflux for 2-3 years.   She has tried nexium and a few other drugs.  She saw GI in 2011 for her colonoscopy.  Dr. Earlean Shawl  She did have a history of thyroid nodule and had a (benign) bx in 2013.  Would like to have a follow-up US which is quite reasonable   Her breathing seems to be ok She is going to pain management, sees Dr. Tessa Lerner.    She is helping to take care of her mother in law; she has ended up doing this 5 days a week and it is overwhelming to her.  She would like to decrease her working hours but is not sure of how to go about this  She sees OBGYN- Dr. Ulanda Edison  She had a pneumonia shot in 2008, also her tdap in 2008. Would like to update her Td today  She would like to increase her lasix - she is taking 20 mg a day right now on a prn basis for leg swelling.  It is helping  her but she does sometimes still have some swelling.   Wonders if she could take 40 mg.  She has not noted any sx of hypotension  BP Readings from Last 3 Encounters:  02/25/17 108/82  02/08/17 106/83  01/05/17 128/87    Patient Active Problem List   Diagnosis Date Noted  . Bilateral carpal tunnel syndrome 09/30/2016  . Lumbar facet arthropathy ([place]) 06/23/2016  . Trochanteric bursitis of left hip 06/23/2016  . Cervical post-laminectomy syndrome 06/23/2016  . PE (pulmonary embolism) 08/24/2014  . Chest pain 08/24/2014  . Pleuritic chest pain 08/24/2014  . Hypokalemia 08/24/2014  . Hypomagnesemia 08/24/2014  . Medication monitoring encounter 10/21/2012  . Medication management 10/21/2012  . Cervicalgia 02/29/2012  . Lumbar pain with radiation down left leg 02/29/2012  . Neuropathic pain, arm 02/29/2012  . Elevated BP 01/05/2012  . Chronic pain disorder 01/05/2012  . GERD (gastroesophageal reflux disease) 01/04/2012  . Depression 01/04/2012  . COPD (chronic obstructive pulmonary disease) ([place])   . Osteoporosis   . IBS (irritable bowel syndrome)     Past Medical History:  Diagnosis Date  . Arthritis   .  COPD (chronic obstructive pulmonary disease) ([place])   . Dysthymia   . GERD (gastroesophageal reflux disease)   . IBS (irritable bowel syndrome)   . Osteoporosis   . Other chronic pain   . Smoker     Past Surgical History:  Procedure Laterality Date  . CERVICAL SPINE SURGERY     x 2  . FOOT SURGERY  2009    Social History  Substance Use Topics  . Smoking status: Current Every Day Smoker    Packs/day: 1.00    Years: 35.00    Types: Cigarettes  . Smokeless tobacco: Never Used     Comment: trying to cut back  . Alcohol use No    Family History  Problem Relation Age of Onset  . COPD Mother   . Cancer Mother   . Dementia Mother   . Cancer Father   . CAD Father 52  . Cancer Brother   . Cirrhosis Brother     Allergies  Allergen Reactions  . Methadone  Other (See Comments)    Fluid retention  . Hydrocodone Nausea Only  . Topamax Other (See Comments)    osteoporosis  . Hctz [Hydrochlorothiazide] Rash  . Neurontin [Gabapentin] Swelling  . Pregabalin Other (See Comments)    Dizziness    Medication list has been reviewed and updated.  Current Outpatient Prescriptions on File Prior to Visit  Medication Sig Dispense Refill  . bisacodyl (DULCOLAX) 5 MG EC tablet Take 15-20 mg by mouth at bedtime.    . Calcium-Vitamin D 500-100 MG-UNIT WAFR Take 1 each by mouth 2 (two) times daily.     . clonazePAM (KLONOPIN) 1 MG tablet Take 1 mg by mouth 2 (two) times daily.     . cyclobenzaprine (FLEXERIL) 10 MG tablet Take 1 tablet (10 mg total) by mouth 3 (three) times daily. 90 tablet 3  . DULoxetine (CYMBALTA) 30 MG capsule Take 90 mg by mouth daily.    Marland Kitchen oxyCODONE (ROXICODONE) 15 MG immediate release tablet Take 1 tablet (15 mg total) by mouth 4 (four) times daily as needed. 120 tablet 0  . ranitidine (ZANTAC) 150 MG tablet Take 1 tablet (150 mg total) by mouth 2 (two) times daily. Use as needed for GERD 60 tablet 6  . traZODone (DESYREL) 100 MG tablet Take 2 tablets (200 mg total) by mouth at bedtime. 60 tablet 3  . zonisamide (ZONEGRAN) 100 MG capsule Take 2 capsules (200 mg total) by mouth at bedtime. 60 capsule 4   No current facility-administered medications on file prior to visit.     Review of Systems:  As per HPI- otherwise negative. No CP or SOB  Physical Examination: Vitals:   02/25/17 1752  BP: 108/82  Pulse: (!) 104  Temp: 98 F (36.7 C)   Vitals:   02/25/17 1752  Weight: 168 lb 9.6 oz (76.5 kg)  Height: '5\' 4"'  (1.626 m)   Body mass index is 28.94 kg/m. Ideal Body Weight: Weight in (lb) to have BMI = 25: 145.3  GEN: WDWN, NAD, Non-toxic, A & O x 3, overweight, otherwise looks well HEENT: Atraumatic, Normocephalic. Neck supple. No masses, No LAD. Ears and Nose: No external deformity. CV: RRR, No M/G/R. No JVD. No  thrill. No extra heart sounds. PULM: CTA B, no wheezes, crackles, rhonchi. No retractions. No resp. distress. No accessory muscle use. ABD: S, NT, ND EXTR: No c/c/e NEURO Normal gait for pt, uses a cane PSYCH: Normally interactive. Conversant. Not depressed or anxious appearing.  Calm demeanor.    Assessment and Plan: Thyroid nodule - Plan: US THYROID  Immunization due - Plan: Td vaccine greater than or equal to 7yo preservative free IM  Leg edema - Plan: furosemide (LASIX) 20 MG tablet  Medication monitoring encounter - Plan: Basic metabolic panel  Screening for hyperlipidemia - Plan: Lipid panel  Encounter for hepatitis C screening test for low risk patient - Plan: Hepatitis C antibody  Here today for a follow-up visit She is not fasting but did not eat much today- will check her lipids today Also do her hep C screening BMP as she is using lasix She would like to use up to 40 mg a day for her LE swelling- this is fine, will make sure her BMP is ok Ordered thyroid US for her   Signed Lamar Blinks, MD

## 2017-02-25 NOTE — Patient Instructions (Addendum)
Please give Dr. Earlean Shawl a call and schedule to see him and discuss your reflux Phone: [phone]  We will arrange for a thyroid ultrasound for you  You can use the lasix 1 or 2 daily if needed for your swelling- just watch for any sign of low blood sugar like dizziness.   We will check your metabolic profile and cholesterol today- I will be in touch with these results asap You got your tetanus booster today

## 2017-02-26 ENCOUNTER — Encounter: Payer: Self-pay | Admitting: Family Medicine

## 2017-02-26 ENCOUNTER — Ambulatory Visit (HOSPITAL_BASED_OUTPATIENT_CLINIC_OR_DEPARTMENT_OTHER)
Admission: RE | Admit: 2017-02-26 | Discharge: 2017-02-26 | Disposition: A | Discharge status: Home / Self Care | Payer: Medicare Other | Source: Ambulatory Visit | Attending: Family Medicine | Admitting: Family Medicine

## 2017-02-26 ENCOUNTER — Other Ambulatory Visit: Payer: Self-pay | Admitting: Family Medicine

## 2017-02-26 DIAGNOSIS — E041 Nontoxic single thyroid nodule: Secondary | ICD-10-CM | POA: Insufficient documentation

## 2017-02-26 DIAGNOSIS — Z1231 Encounter for screening mammogram for malignant neoplasm of breast: Secondary | ICD-10-CM

## 2017-02-26 LAB — BASIC METABOLIC PANEL
BUN: 8 mg/dL (ref 6–23)
CALCIUM: 9.5 mg/dL (ref 8.4–10.5)
CO2: 32 meq/L (ref 19–32)
CREATININE: 0.81 mg/dL (ref 0.40–1.20)
Chloride: 100 mEq/L (ref 96–112)
GFR: 77.09 mL/min (ref >60.00)
Glucose, Bld: 69 mg/dL — ABNORMAL LOW (ref 70–99)
Potassium: 3.6 mEq/L (ref 3.5–5.1)
SODIUM: 139 meq/L (ref 135–145)

## 2017-02-26 LAB — LIPID PANEL
Cholesterol: 155 mg/dL (ref 0–200)
HDL: 40 mg/dL (ref >39.00)
LDL CALC: 80 mg/dL (ref 0–99)
NonHDL: 114.89
TRIGLYCERIDES: 176 mg/dL — AB (ref 0.0–149.0)
Total CHOL/HDL Ratio: 4
VLDL: 35.2 mg/dL (ref 0.0–40.0)

## 2017-02-27 LAB — HEPATITIS C ANTIBODY: HCV AB: NEGATIVE

## 2017-03-05 ENCOUNTER — Ambulatory Visit: Payer: Medicare Other | Admitting: Registered Nurse

## 2017-03-05 ENCOUNTER — Encounter: Payer: Medicare Other | Attending: Physical Medicine & Rehabilitation | Admitting: Registered Nurse

## 2017-03-05 ENCOUNTER — Encounter: Payer: Self-pay | Admitting: Registered Nurse

## 2017-03-05 VITALS — BP 109/73 | HR 116

## 2017-03-05 DIAGNOSIS — Z5181 Encounter for therapeutic drug level monitoring: Secondary | ICD-10-CM | POA: Diagnosis not present

## 2017-03-05 DIAGNOSIS — M4696 Unspecified inflammatory spondylopathy, lumbar region: Secondary | ICD-10-CM

## 2017-03-05 DIAGNOSIS — M542 Cervicalgia: Secondary | ICD-10-CM | POA: Diagnosis not present

## 2017-03-05 DIAGNOSIS — M5416 Radiculopathy, lumbar region: Secondary | ICD-10-CM | POA: Diagnosis not present

## 2017-03-05 DIAGNOSIS — G894 Chronic pain syndrome: Secondary | ICD-10-CM

## 2017-03-05 DIAGNOSIS — Z79899 Other long term (current) drug therapy: Secondary | ICD-10-CM

## 2017-03-05 DIAGNOSIS — M4302 Spondylolysis, cervical region: Secondary | ICD-10-CM | POA: Diagnosis not present

## 2017-03-05 DIAGNOSIS — M961 Postlaminectomy syndrome, not elsewhere classified: Secondary | ICD-10-CM

## 2017-03-05 DIAGNOSIS — M47816 Spondylosis without myelopathy or radiculopathy, lumbar region: Secondary | ICD-10-CM

## 2017-03-05 DIAGNOSIS — G8929 Other chronic pain: Secondary | ICD-10-CM | POA: Diagnosis not present

## 2017-03-05 DIAGNOSIS — M62838 Other muscle spasm: Secondary | ICD-10-CM | POA: Diagnosis not present

## 2017-03-05 DIAGNOSIS — M5412 Radiculopathy, cervical region: Secondary | ICD-10-CM

## 2017-03-05 DIAGNOSIS — M7062 Trochanteric bursitis, left hip: Secondary | ICD-10-CM

## 2017-03-05 DIAGNOSIS — Z9889 Other specified postprocedural states: Secondary | ICD-10-CM | POA: Insufficient documentation

## 2017-03-05 MED ORDER — OXYCODONE HCL 15 MG PO TABS: 15.0000 mg | ORAL_TABLET | Freq: Four times a day (QID) | ORAL | 0 refills | Status: DC | PRN

## 2017-03-05 NOTE — Progress Notes (Signed)
Subjective:    Patient ID: Allison Lee, female    DOB: 07-Nov-1955, 61 y.o.   MRN: [id number]  HPI: [name] is a 61year old female who returns for follow up appointment and medication refill. Allison Lee states her pain is located in her neck radiating into her bilateral shoulders andlower back pain radiating in her left hip and left lower extremity laterally and anteriorly. Allison Lee rates her pain 7. Her current exercise regime is performing stretching exercises and walking.   Also states Allison Lee has been caring for her mother-in-law 7 days a week, and Allison Lee realizes this has caused icreased intensity of lower back  pain and stress. I asked Allison Lee to speak with her husband and to call for a family meeting to divide the care responsibilities, Allison Lee verbalizes understanding.   Allison Lee  has a Social worker and psychiatrist to address her emotional stress.  Allison Lee last UDS was on 02/09/2016 it was consistent.  Pain Inventory Average Pain 7 Pain Right Now 7 My pain is constant, sharp, burning, dull, stabbing, tingling and aching  In the last 24 hours, has pain interfered with the following? General activity 7 Relation with others 7 Enjoyment of life 8 What TIME of day is your pain at its worst? all Sleep (in general) Poor  Pain is worse with: walking, bending, sitting and standing Pain improves with: rest and medication Relief from Meds: 6  Mobility walk with assistance use a cane ability to climb steps?  yes do you drive?  yes  Function disabled: date disabled 2005 I need assistance with the following:  dressing, meal prep and household duties  Neuro/Psych weakness numbness tingling spasms dizziness depression anxiety  Prior Studies Any changes since last visit?  no  Physicians involved in your care Any changes since last visit?  no   Family History  Problem Relation Age of Onset  . COPD Mother   . Cancer Mother   . Dementia Mother   . Cancer Father   . CAD  Father 45  . Cancer Brother   . Cirrhosis Brother    Social History   Social History  . Marital status: Married    Spouse name: N/A  . Number of children: 2  . Years of education: N/A   Occupational History  . DISABLED Unemployed   Social History Main Topics  . Smoking status: Current Every Day Smoker    Packs/day: 1.00    Years: 35.00    Types: Cigarettes  . Smokeless tobacco: Never Used     Comment: trying to cut back  . Alcohol use No  . Drug use: No  . Sexual activity: Not on file   Other Topics Concern  . Not on file   Social History Narrative   Lives at home with husband   Past Surgical History:  Procedure Laterality Date  . CERVICAL SPINE SURGERY     x 2  . FOOT SURGERY  2009   Past Medical History:  Diagnosis Date  . Arthritis   . COPD (chronic obstructive pulmonary disease) ([place])   . Dysthymia   . GERD (gastroesophageal reflux disease)   . IBS (irritable bowel syndrome)   . Osteoporosis   . Other chronic pain   . Smoker    BP 109/73   Pulse (!) 116   SpO2 97%   Opioid Risk Score:   Fall Risk Score:  `1  Depression screen PHQ 2/9  Depression screen [facility] 2/9 02/08/2017 07/21/2016 06/23/2016  Decreased  Interest 3 - 3  Down, Depressed, Hopeless 3 - 3  PHQ - 2 Score 6 - 6  Altered sleeping - 3 3  Tired, decreased energy - 3 3  Change in appetite - - 0  Feeling bad or failure about yourself  - - 2  Trouble concentrating - - 2  Moving slowly or fidgety/restless - - 2  Suicidal thoughts - - 0  PHQ-9 Score - - 18  Difficult doing work/chores - - Very difficult    Review of Systems  Constitutional: Positive for unexpected weight change.  HENT: Negative.   Eyes: Negative.   Respiratory: Positive for shortness of breath.   Cardiovascular: Negative.   Gastrointestinal: Positive for constipation, nausea and vomiting.  Endocrine: Negative.   Genitourinary: Negative.   Musculoskeletal: Positive for joint swelling.  Skin: Negative.     Allergic/Immunologic: Negative.   Neurological: Negative.   Hematological: Negative.   Psychiatric/Behavioral: Negative.   All other systems reviewed and are negative.      Objective:   Physical Exam  Constitutional: Allison Lee is oriented to person, place, and time. Allison Lee appears well-developed and well-nourished.  HENT:  Head: Normocephalic and atraumatic.  Neck: Normal range of motion. Neck supple.  Cardiovascular: Normal rate and regular rhythm.   Pulmonary/Chest: Effort normal and breath sounds normal.  Musculoskeletal:  Normal Muscle Bulk and Muscle Testing Reveals: Upper Extremities: Decreased ROM 90 Degrees and Muscle Strength 4/5 Bilateral AC Joint Tenderness Thoracic Hypersensitivity: T-1-T-4 Lumbar Paraspinal Tenderness: L-3-L-5 Mainly Left Side Lower Extremities: Full ROM and Muscle Strength 5/5 Left Lower Extremity Flexion Produces Pain into Lumbar and Left Hip Arises from Table Slowly using straight cane for support Antalgic gait  Neurological: Allison Lee is alert and oriented to person, place, and time.  Skin: Skin is warm and dry.  Psychiatric: Allison Lee has a normal mood and affect.  Nursing note and vitals reviewed.         Assessment & Plan:  1. Cervical spondylolysis with chronic neck pain/ Cervical Radiculopathy: Allison Lee hasdecreased range of motion, chronic left upper extremity pain. Patient s/p cervical spine surgery per Dr Patrice Paradise on Mar 09, 2012.  Continue Zonegran. 03/05/2017 2. Lumbar degenerative disc disease as well as facet arthropathy and chronic low back pain. Continue HEP. 03/05/2017 3. 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 03/05/2017 4. Left Greater trochantericbursitis: Continue Ice Therapy and HEP. 03/05/2017 5. Chronic pain syndrome: Continue Flexeril and Mobic. 03/05/2017 Refilled oxycodone 15mg  one tablet QID as needed #120.  We will continue the opioid monitoring program, this consists of regular clinic visits,  examinations, urine drug screen, pill counts as well as use of [place] Controlled Substance Reporting System.  6. Depression/anxiety-: Psychiatry Following: Continue Klonopin and Cymbalta.03/05/2017 7. Muscle Spasm: Continue Flexeril  20 minutes of face to face patient care time was spent during this visit. All questions were encouraged and answered.  F/U in 1 month

## 2017-03-23 ENCOUNTER — Ambulatory Visit: Payer: Medicare Other

## 2017-04-02 ENCOUNTER — Ambulatory Visit
Admission: RE | Admit: 2017-04-02 | Discharge: 2017-04-02 | Disposition: A | Discharge status: Home / Self Care | Payer: Medicare Other | Source: Ambulatory Visit | Attending: Family Medicine | Admitting: Family Medicine

## 2017-04-02 DIAGNOSIS — Z1231 Encounter for screening mammogram for malignant neoplasm of breast: Secondary | ICD-10-CM

## 2017-04-06 ENCOUNTER — Encounter: Payer: Self-pay | Admitting: Registered Nurse

## 2017-04-06 ENCOUNTER — Encounter: Payer: Medicare Other | Attending: Physical Medicine & Rehabilitation | Admitting: Registered Nurse

## 2017-04-06 ENCOUNTER — Telehealth: Payer: Self-pay | Admitting: Registered Nurse

## 2017-04-06 VITALS — BP 112/79 | HR 102

## 2017-04-06 DIAGNOSIS — M4302 Spondylolysis, cervical region: Secondary | ICD-10-CM | POA: Diagnosis present

## 2017-04-06 DIAGNOSIS — M961 Postlaminectomy syndrome, not elsewhere classified: Secondary | ICD-10-CM | POA: Diagnosis not present

## 2017-04-06 DIAGNOSIS — M5416 Radiculopathy, lumbar region: Secondary | ICD-10-CM | POA: Diagnosis not present

## 2017-04-06 DIAGNOSIS — Z5181 Encounter for therapeutic drug level monitoring: Secondary | ICD-10-CM

## 2017-04-06 DIAGNOSIS — M7062 Trochanteric bursitis, left hip: Secondary | ICD-10-CM | POA: Diagnosis not present

## 2017-04-06 DIAGNOSIS — M4696 Unspecified inflammatory spondylopathy, lumbar region: Secondary | ICD-10-CM

## 2017-04-06 DIAGNOSIS — Z9889 Other specified postprocedural states: Secondary | ICD-10-CM | POA: Insufficient documentation

## 2017-04-06 DIAGNOSIS — Z79899 Other long term (current) drug therapy: Secondary | ICD-10-CM

## 2017-04-06 DIAGNOSIS — M542 Cervicalgia: Secondary | ICD-10-CM

## 2017-04-06 DIAGNOSIS — M47816 Spondylosis without myelopathy or radiculopathy, lumbar region: Secondary | ICD-10-CM

## 2017-04-06 DIAGNOSIS — G8929 Other chronic pain: Secondary | ICD-10-CM | POA: Insufficient documentation

## 2017-04-06 DIAGNOSIS — M5412 Radiculopathy, cervical region: Secondary | ICD-10-CM | POA: Diagnosis not present

## 2017-04-06 DIAGNOSIS — G894 Chronic pain syndrome: Secondary | ICD-10-CM | POA: Diagnosis not present

## 2017-04-06 MED ORDER — OXYCODONE HCL 15 MG PO TABS: 15.0000 mg | ORAL_TABLET | Freq: Four times a day (QID) | ORAL | 0 refills | Status: DC | PRN

## 2017-04-06 NOTE — Telephone Encounter (Signed)
On 04/06/2017 the  [place] was reviewed no conflict was seen on the [place] with multiple prescribers. Ms. Tozzi has a signed narcotic contract with our office. If there were any discrepancies this would have been reported to her physician.

## 2017-04-06 NOTE — Progress Notes (Signed)
Subjective:    Patient ID: Allison Lee, female    DOB: 1956/01/07, 61 y.o.   MRN: [id number]  HPI: Allison Lee is a 61year old female who returns for follow up appointment and medication refill. She states her pain is located in her neck radiating into her right shoulder andlower back painradiating in her left groin, left hip, left buttock and left lower extremity posteriorly. She rates her pain 7. Her current exercise regime is performing stretching exercises and walking.   Allison Lee last UDS was on 02/09/2016 it was consistent.  Pain Inventory Average Pain 6 Pain Right Now 8 My pain is constant, sharp, burning, dull, stabbing, tingling and aching  In the last 24 hours, has pain interfered with the following? General activity 9 Relation with others 8 Enjoyment of life 9 What TIME of day is your pain at its worst? all Sleep (in general) Poor  Pain is worse with: walking, bending, sitting, inactivity and standing Pain improves with: rest and medication Relief from Meds: 6  Mobility use a cane how many minutes can you walk? 1-2 ability to climb steps?  yes do you drive?  yes  Function disabled: date disabled 05/2004 I need assistance with the following:  meal prep, household duties and shopping  Neuro/Psych weakness numbness tingling spasms depression anxiety  Prior Studies Any changes since last visit?  no  Physicians involved in your care Any changes since last visit?  no   Family History  Problem Relation Age of Onset  . COPD Mother   . Cancer Mother   . Dementia Mother   . Cancer Father   . CAD Father 42  . Cancer Brother   . Cirrhosis Brother    Social History   Social History  . Marital status: Married    Spouse name: N/A  . Number of children: 2  . Years of education: N/A   Occupational History  . DISABLED Unemployed   Social History Main Topics  . Smoking status: Current Every Day Smoker    Packs/day: 1.00    Years: 35.00     Types: Cigarettes  . Smokeless tobacco: Never Used     Comment: trying to cut back  . Alcohol use No  . Drug use: No  . Sexual activity: Not Asked   Other Topics Concern  . None   Social History Narrative   Lives at home with husband   Past Surgical History:  Procedure Laterality Date  . CERVICAL SPINE SURGERY     x 2  . FOOT SURGERY  2009   Past Medical History:  Diagnosis Date  . Arthritis   . COPD (chronic obstructive pulmonary disease) ([place])   . Dysthymia   . GERD (gastroesophageal reflux disease)   . IBS (irritable bowel syndrome)   . Osteoporosis   . Other chronic pain   . Smoker    BP 104/74   Pulse (!) 104   SpO2 94%   Opioid Risk Score:  1 Fall Risk Score:  `1  Depression screen PHQ 2/9  Depression screen [facility] 2/9 02/08/2017 07/21/2016 06/23/2016  Decreased Interest 3 - 3  Down, Depressed, Hopeless 3 - 3  PHQ - 2 Score 6 - 6  Altered sleeping - 3 3  Tired, decreased energy - 3 3  Change in appetite - - 0  Feeling bad or failure about yourself  - - 2  Trouble concentrating - - 2  Moving slowly or fidgety/restless - - 2  Suicidal thoughts - - 0  PHQ-9 Score - - 18  Difficult doing work/chores - - Very difficult     Review of Systems  Constitutional: Positive for unexpected weight change.  HENT: Negative.   Eyes: Negative.   Respiratory: Positive for shortness of breath.   Cardiovascular: Positive for leg swelling.  Gastrointestinal: Positive for constipation.  Endocrine: Negative.   Genitourinary: Negative.   Musculoskeletal:       Spasms  Skin: Negative.   Allergic/Immunologic: Negative.   Neurological: Positive for weakness and numbness.       Tingling  Psychiatric/Behavioral: Positive for dysphoric mood. The patient is nervous/anxious.   All other systems reviewed and are negative.      Objective:   Physical Exam  Constitutional: She is oriented to person, place, and time. She appears well-developed and well-nourished.  HENT:    Head: Normocephalic and atraumatic.  Neck: Normal range of motion. Neck supple.  Cervical Paraspinal Tenderness: C-5-C-6  Cardiovascular: Normal rate and regular rhythm.   Pulmonary/Chest: Effort normal and breath sounds normal.  Musculoskeletal:  Normal Muscle Bulk and Muscle Testing Reveals: Upper Extremities: Full ROM and Muscle Strength 5/5 Thoracic Paraspinal Tenderness: T-1-T-3 Mainly Left Side  Lumbar Paraspinal Tenderness: L-3-L-5 Left Greater Trochanter Tenderness Lower Extremities: Right: Full ROM and Muscle Strength 5/5 Left: Decreased ROM and Muscle Strength 4/5 Left Lower Extremity Flexion Produces Pain into Lumbar and Left Hip Arises from Table Slowly using straight cane for support Antalgic  Gait    Neurological: She is alert and oriented to person, place, and time.  Skin: Skin is warm and dry.  Psychiatric: She has a normal mood and affect.  Nursing note and vitals reviewed.         Assessment & Plan:  1. Cervical spondylolysis with chronic neck pain/ Cervical Radiculopathy: She hasdecreased range of motion, chronic left upper extremity pain. Patient s/p cervical spine surgery per Dr Patrice Paradise on Mar 09, 2012.  Continue Zonegran. 04/05/2017 2. Lumbar degenerative disc disease as well as facet arthropathy and chronic low back pain. Continue HEP. 04/05/2017 3. 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 04/05/2017 4. Left Greater trochantericbursitis: Schedule for Cortisone Injection with Dr. Naaman Plummer.Continue Ice Therapy and HEP. 04/05/2017 5. Chronic pain syndrome: Continue Flexeril and Mobic. 04/05/2017 Refilled oxycodone 15mg  one tablet QID as needed #120. Second script given to accommodate scheduled appointment.  We will continue the opioid monitoring program, this consists of regular clinic visits, examinations, urine drug screen, pill counts as well as use of [place] Controlled Substance Reporting System.  6.  Depression/anxiety-: Psychiatry Following: Continue Klonopin and Cymbalta.04/05/2017 7. Muscle Spasm: Continue Flexeril. 04/05/2017.  20 minutes of face to face patient care time was spent during this visit. All questions were encouraged and answered.    F/U in 1 month

## 2017-04-08 ENCOUNTER — Other Ambulatory Visit: Payer: Self-pay | Admitting: Registered Nurse

## 2017-04-08 DIAGNOSIS — M79605 Pain in left leg: Secondary | ICD-10-CM

## 2017-04-08 DIAGNOSIS — M7062 Trochanteric bursitis, left hip: Secondary | ICD-10-CM

## 2017-04-08 DIAGNOSIS — M47816 Spondylosis without myelopathy or radiculopathy, lumbar region: Secondary | ICD-10-CM

## 2017-04-08 DIAGNOSIS — M542 Cervicalgia: Secondary | ICD-10-CM

## 2017-04-08 DIAGNOSIS — M961 Postlaminectomy syndrome, not elsewhere classified: Secondary | ICD-10-CM

## 2017-04-08 DIAGNOSIS — M545 Low back pain: Secondary | ICD-10-CM

## 2017-04-26 ENCOUNTER — Other Ambulatory Visit: Payer: Self-pay | Admitting: Physical Medicine & Rehabilitation

## 2017-04-26 DIAGNOSIS — M545 Low back pain: Secondary | ICD-10-CM

## 2017-04-26 DIAGNOSIS — M542 Cervicalgia: Secondary | ICD-10-CM

## 2017-04-26 DIAGNOSIS — M79605 Pain in left leg: Secondary | ICD-10-CM

## 2017-04-26 DIAGNOSIS — M961 Postlaminectomy syndrome, not elsewhere classified: Secondary | ICD-10-CM

## 2017-04-26 DIAGNOSIS — M47816 Spondylosis without myelopathy or radiculopathy, lumbar region: Secondary | ICD-10-CM

## 2017-04-26 DIAGNOSIS — M7062 Trochanteric bursitis, left hip: Secondary | ICD-10-CM

## 2017-04-28 ENCOUNTER — Telehealth: Payer: Self-pay | Admitting: *Deleted

## 2017-04-28 NOTE — Telephone Encounter (Signed)
Portia from [facility] called to let us know cyclobenzaprine was approved for one year.

## 2017-05-18 ENCOUNTER — Encounter: Payer: Medicare Other | Attending: Physical Medicine & Rehabilitation | Admitting: Physical Medicine & Rehabilitation

## 2017-05-18 ENCOUNTER — Encounter: Payer: Self-pay | Admitting: Physical Medicine & Rehabilitation

## 2017-05-18 VITALS — BP 94/69 | HR 105

## 2017-05-18 DIAGNOSIS — M4302 Spondylolysis, cervical region: Secondary | ICD-10-CM | POA: Diagnosis not present

## 2017-05-18 DIAGNOSIS — M7062 Trochanteric bursitis, left hip: Secondary | ICD-10-CM | POA: Diagnosis not present

## 2017-05-18 DIAGNOSIS — Z9889 Other specified postprocedural states: Secondary | ICD-10-CM | POA: Insufficient documentation

## 2017-05-18 DIAGNOSIS — G8929 Other chronic pain: Secondary | ICD-10-CM | POA: Insufficient documentation

## 2017-05-18 NOTE — Patient Instructions (Signed)
CONTINUE YOUR STRETCHES DAILY!!!

## 2017-05-18 NOTE — Progress Notes (Signed)
PROCEDURE NOTE  DIAGNOSIS: left greater troch bursitis  INTERVENTION:  Left peri-trochanteric injection     After informed consent and preparation of the skin with betadine and isopropyl alcohol, I injected 6mg  (1cc) of celestone and 4cc of 1% lidocaine around the left greater trochanter via lateral approach. Additionally, aspiration was performed prior to injection. The patient tolerated well, and no complications were encountered. Afterward the area was cleaned and dressed. Post- injection instructions were provided.      Meredith Staggers, MD, [facility] 05/18/2017

## 2017-05-19 NOTE — Telephone Encounter (Signed)
Error/gd °

## 2017-06-04 ENCOUNTER — Encounter: Payer: Medicare Other | Attending: Physical Medicine & Rehabilitation | Admitting: Registered Nurse

## 2017-06-04 ENCOUNTER — Encounter: Payer: Self-pay | Admitting: Registered Nurse

## 2017-06-04 VITALS — BP 99/73 | HR 112

## 2017-06-04 DIAGNOSIS — M5416 Radiculopathy, lumbar region: Secondary | ICD-10-CM

## 2017-06-04 DIAGNOSIS — G8929 Other chronic pain: Secondary | ICD-10-CM | POA: Insufficient documentation

## 2017-06-04 DIAGNOSIS — M4696 Unspecified inflammatory spondylopathy, lumbar region: Secondary | ICD-10-CM

## 2017-06-04 DIAGNOSIS — M7062 Trochanteric bursitis, left hip: Secondary | ICD-10-CM

## 2017-06-04 DIAGNOSIS — M5412 Radiculopathy, cervical region: Secondary | ICD-10-CM | POA: Diagnosis not present

## 2017-06-04 DIAGNOSIS — Z5181 Encounter for therapeutic drug level monitoring: Secondary | ICD-10-CM

## 2017-06-04 DIAGNOSIS — G894 Chronic pain syndrome: Secondary | ICD-10-CM

## 2017-06-04 DIAGNOSIS — M47816 Spondylosis without myelopathy or radiculopathy, lumbar region: Secondary | ICD-10-CM

## 2017-06-04 DIAGNOSIS — M62838 Other muscle spasm: Secondary | ICD-10-CM | POA: Diagnosis not present

## 2017-06-04 DIAGNOSIS — Z9889 Other specified postprocedural states: Secondary | ICD-10-CM | POA: Diagnosis not present

## 2017-06-04 DIAGNOSIS — Z79899 Other long term (current) drug therapy: Secondary | ICD-10-CM | POA: Diagnosis not present

## 2017-06-04 DIAGNOSIS — M4302 Spondylolysis, cervical region: Secondary | ICD-10-CM | POA: Insufficient documentation

## 2017-06-04 DIAGNOSIS — M542 Cervicalgia: Secondary | ICD-10-CM | POA: Diagnosis not present

## 2017-06-04 DIAGNOSIS — M961 Postlaminectomy syndrome, not elsewhere classified: Secondary | ICD-10-CM

## 2017-06-04 MED ORDER — OXYCODONE HCL 15 MG PO TABS: 15.0000 mg | ORAL_TABLET | Freq: Four times a day (QID) | ORAL | 0 refills | Status: DC | PRN

## 2017-06-04 NOTE — Progress Notes (Signed)
Subjective:    Patient ID: Allison Lee, female    DOB: Mar 19, 1956, 61 y.o.   MRN: [id number]  HPI: [name] is a 61year old female who returns for follow up appointment and medication refill. She states her pain is located in her neck radiating into her left  shoulder and left arm with tingling and numbness.Also reportslower back painradiating in her  left hip, left buttock and leftlower extremity posteriorly. She rates her pain 8. Her current exercise regime is performing stretching exercises with bands and walking.   S/P Left hip injection with no relief noted she reports.   [name] last UDS was on 02/09/2016 it was consistent. Oral swab performed today.   Pain Inventory Average Pain 7 Pain Right Now 8 My pain is constant, sharp, burning, dull, stabbing, tingling and aching  In the last 24 hours, has pain interfered with the following? General activity 9 Relation with others 9 Enjoyment of life 9 What TIME of day is your pain at its worst? all Sleep (in general) Poor  Pain is worse with: walking, bending, sitting and standing Pain improves with: rest and medication Relief from Meds: 6  Mobility walk with assistance use a cane ability to climb steps?  yes do you drive?  yes  Function disabled: date disabled 2005 I need assistance with the following:  dressing and bathing  Neuro/Psych weakness numbness tingling spasms depression anxiety  Prior Studies Any changes since last visit?  no  Physicians involved in your care Any changes since last visit?  no   Family History  Problem Relation Age of Onset  . COPD Mother   . Cancer Mother   . Dementia Mother   . Cancer Father   . CAD Father 5  . Cancer Brother   . Cirrhosis Brother    Social History   Social History  . Marital status: Married    Spouse name: N/A  . Number of children: 2  . Years of education: N/A   Occupational History  . DISABLED Unemployed   Social History  Main Topics  . Smoking status: Current Every Day Smoker    Packs/day: 1.00    Years: 35.00    Types: Cigarettes  . Smokeless tobacco: Never Used     Comment: trying to cut back  . Alcohol use No  . Drug use: No  . Sexual activity: Not on file   Other Topics Concern  . Not on file   Social History Narrative   Lives at home with husband   Past Surgical History:  Procedure Laterality Date  . CERVICAL SPINE SURGERY     x 2  . FOOT SURGERY  2009   Past Medical History:  Diagnosis Date  . Arthritis   . COPD (chronic obstructive pulmonary disease) ([place])   . Dysthymia   . GERD (gastroesophageal reflux disease)   . IBS (irritable bowel syndrome)   . Osteoporosis   . Other chronic pain   . Smoker    There were no vitals taken for this visit.  Opioid Risk Score:   Fall Risk Score:  `1  Depression screen PHQ 2/9  Depression screen [facility] 2/9 02/08/2017 07/21/2016 06/23/2016  Decreased Interest 3 - 3  Down, Depressed, Hopeless 3 - 3  PHQ - 2 Score 6 - 6  Altered sleeping - 3 3  Tired, decreased energy - 3 3  Change in appetite - - 0  Feeling bad or failure about yourself  - -  2  Trouble concentrating - - 2  Moving slowly or fidgety/restless - - 2  Suicidal thoughts - - 0  PHQ-9 Score - - 18  Difficult doing work/chores - - Very difficult     Review of Systems  Constitutional: Positive for unexpected weight change.  HENT: Negative.   Eyes: Negative.   Respiratory: Negative.   Cardiovascular: Negative.   Gastrointestinal: Positive for constipation.  Endocrine: Negative.   Genitourinary: Negative.   Musculoskeletal: Positive for joint swelling.  Skin: Negative.   Allergic/Immunologic: Negative.   Neurological: Negative.   Hematological: Negative.   Psychiatric/Behavioral: Negative.   All other systems reviewed and are negative.      Objective:   Physical Exam  Constitutional: She is oriented to person, place, and time. She appears well-developed and  well-nourished.  HENT:  Head: Normocephalic and atraumatic.  Neck: Normal range of motion. Neck supple.  Cervical Paraspinal Tenderness: C-5-C-6  Cardiovascular: Normal rate and regular rhythm.   Pulmonary/Chest: Effort normal and breath sounds normal.  Musculoskeletal:  Normal Muscle Bulk and Muscle Testing Reveals:  Upper Extremities: Full ROM and Muscle Strength 5/5 Thoracic Paraspinal Tenderness: T-1-T-5 Mainly Left Side Lumbar Paraspinal Tenderness: L-3-L-5 Mainly Left Side Lower Extremities: Right: Full ROM and Muscle Strength 5/5 Left: Decreased ROM and Muscle Strength 4/5 Left Lower Extremity Flexion Produces Pain into Hip and Lumbar Arises from Table Slowly using Straight Cane for support Narrow Based Gait    Neurological: She is alert and oriented to person, place, and time.  Skin: Skin is warm and dry.  Psychiatric: She has a normal mood and affect.  Nursing note and vitals reviewed.         Assessment & Plan:  1. Cervical spondylolysis with chronic neck pain/ Cervical Radiculopathy: She hasdecreased range of motion, chronic left upper extremity pain. Patient s/p cervical spine surgery per Dr Patrice Paradise on Mar 09, 2012.  Continue Zonegran. 06/04/2017 2. Lumbar degenerative disc disease as well as facet arthropathy and chronic low back pain. Continue HEP. 06/04/2017 3. 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 06/04/2017 4. Left Greater trochantericbursitis: S/PCortisone Injection no Relief Noted. Continue Ice Therapy and HEP. 06/04/2017 5. Chronic pain syndrome:Continue Flexeril. 06/04/2017 Refilled oxycodone 15mg  one tablet QID as needed #120.  We will continue the opioid monitoring program, this consists of regular clinic visits, examinations, urine drug screen, pill counts as well as use of [place] Controlled Substance Reporting System.  6. Depression/anxiety-: Psychiatry Following: Continue Klonopin.06/04/2017 7.  Muscle Spasm: Continue Flexeril. 06/04/2017.  20 minutes of face to face patient care time was spent during this visit. All questions were encouraged and answered.  F/U in 1 month

## 2017-06-10 LAB — DRUG TOX MONITOR 1 W/CONF, ORAL FLD
AMPHETAMINES: NEGATIVE ng/mL (ref <10)
BARBITURATES: NEGATIVE ng/mL (ref <10)
BUPRENORPHINE: NEGATIVE ng/mL (ref <0.025)
Benzodiazepines: NEGATIVE ng/mL (ref <0.50)
COCAINE: NEGATIVE ng/mL (ref <2.5)
COTININE: 11.1 ng/mL — AB (ref <5.0)
Codeine: NEGATIVE ng/mL (ref <2.5)
Dihydrocodeine: NEGATIVE ng/mL (ref <2.5)
Fentanyl: NEGATIVE ng/mL (ref <0.10)
Heroin Metabolite: NEGATIVE ng/mL (ref <1.0)
Hydrocodone: NEGATIVE ng/mL (ref <2.5)
Hydromorphone: NEGATIVE ng/mL (ref <2.5)
MDMA: NEGATIVE ng/mL (ref <10)
METHADONE: NEGATIVE ng/mL (ref <5.0)
MORPHINE: NEGATIVE ng/mL (ref <2.5)
Marijuana: NEGATIVE ng/mL (ref <2.5)
Meperidine: NEGATIVE ng/mL (ref <5.0)
Meprobamate: NEGATIVE ng/mL (ref <2.5)
NICOTINE METABOLITE: POSITIVE ng/mL — AB (ref <5.0)
NORHYDROCODONE: NEGATIVE ng/mL (ref <2.5)
Noroxycodone: 10.4 ng/mL — ABNORMAL HIGH (ref <2.5)
OXYCODONE: 39.1 ng/mL — AB (ref <2.5)
OXYMORPHONE: NEGATIVE ng/mL (ref <2.5)
Opiates: POSITIVE ng/mL — AB (ref <2.5)
Phencyclidine: NEGATIVE ng/mL (ref <10)
Propoxyphene: NEGATIVE ng/mL (ref <5.0)
TRAMADOL: NEGATIVE ng/mL (ref <5.0)
Tapentadol: NEGATIVE ng/mL (ref <5.0)
ZOLPIDEM: NEGATIVE ng/mL (ref <5.0)

## 2017-06-10 LAB — DRUG TOX ALC METAB W/CON, ORAL FLD: Alcohol Metabolite: NEGATIVE ng/mL (ref <25)

## 2017-06-14 ENCOUNTER — Telehealth: Payer: Self-pay | Admitting: *Deleted

## 2017-06-14 NOTE — Telephone Encounter (Signed)
Oral swab drug screen was consistent for prescribed medications.  ?

## 2017-07-02 ENCOUNTER — Other Ambulatory Visit: Payer: Self-pay | Admitting: Physical Medicine & Rehabilitation

## 2017-07-02 ENCOUNTER — Encounter: Payer: Self-pay | Admitting: Registered Nurse

## 2017-07-02 ENCOUNTER — Encounter (HOSPITAL_BASED_OUTPATIENT_CLINIC_OR_DEPARTMENT_OTHER): Payer: Medicare Other | Admitting: Registered Nurse

## 2017-07-02 VITALS — BP 100/64 | HR 110

## 2017-07-02 DIAGNOSIS — M5416 Radiculopathy, lumbar region: Secondary | ICD-10-CM | POA: Diagnosis not present

## 2017-07-02 DIAGNOSIS — Z79899 Other long term (current) drug therapy: Secondary | ICD-10-CM

## 2017-07-02 DIAGNOSIS — M961 Postlaminectomy syndrome, not elsewhere classified: Secondary | ICD-10-CM

## 2017-07-02 DIAGNOSIS — M47816 Spondylosis without myelopathy or radiculopathy, lumbar region: Secondary | ICD-10-CM

## 2017-07-02 DIAGNOSIS — M542 Cervicalgia: Secondary | ICD-10-CM | POA: Diagnosis not present

## 2017-07-02 DIAGNOSIS — K5903 Drug induced constipation: Secondary | ICD-10-CM

## 2017-07-02 DIAGNOSIS — M7062 Trochanteric bursitis, left hip: Secondary | ICD-10-CM

## 2017-07-02 DIAGNOSIS — Z5181 Encounter for therapeutic drug level monitoring: Secondary | ICD-10-CM | POA: Diagnosis not present

## 2017-07-02 DIAGNOSIS — T402X5A Adverse effect of other opioids, initial encounter: Secondary | ICD-10-CM

## 2017-07-02 DIAGNOSIS — G894 Chronic pain syndrome: Secondary | ICD-10-CM | POA: Diagnosis not present

## 2017-07-02 DIAGNOSIS — M79605 Pain in left leg: Secondary | ICD-10-CM

## 2017-07-02 DIAGNOSIS — M545 Low back pain: Secondary | ICD-10-CM

## 2017-07-02 DIAGNOSIS — M5412 Radiculopathy, cervical region: Secondary | ICD-10-CM | POA: Diagnosis not present

## 2017-07-02 DIAGNOSIS — M4696 Unspecified inflammatory spondylopathy, lumbar region: Secondary | ICD-10-CM | POA: Diagnosis not present

## 2017-07-02 DIAGNOSIS — M4302 Spondylolysis, cervical region: Secondary | ICD-10-CM | POA: Diagnosis not present

## 2017-07-02 MED ORDER — OXYCODONE HCL 15 MG PO TABS: 15.0000 mg | ORAL_TABLET | Freq: Four times a day (QID) | ORAL | 0 refills | Status: DC | PRN

## 2017-07-02 NOTE — Progress Notes (Signed)
Subjective:    Patient ID: Allison Lee, female    DOB: [birth year], 61 y.o.   MRN: [id number]  HPI: [name] is a 61year old female who returns for follow up appointment and medication refill. She states her pain is located in her neck radiating into her left  shoulder and left arm with tingling and numbness.Also reportslower back painradiating in her leftlower extremity posteriorly. She rates her pain 8. Her current exercise regime is performing stretching exercises with bands and walking.   [name] states her constipation has become more sever in the last two weeks, she has tried stool softener ( colace), MOM, suppositories and has been prescribed Linzess in the past she had an adverse reaction to Linzess she developed a rash over her body she states. She was given 2 boxes of Movantik samples, she received written and verbal instructions, she verbalizes understanding.   Instructed to call office on Tuesday 07/06/2017 for evaluation of medication she verbalizes understanding.   [name] last UDS was on 06/04/2017 it was consistent.  Pain Inventory Average Pain 7 Pain Right Now 8 My pain is constant, sharp, burning, dull, stabbing, tingling and aching  In the last 24 hours, has pain interfered with the following? General activity 10 Relation with others 9 Enjoyment of life 10 What TIME of day is your pain at its worst? all Sleep (in general) Poor  Pain is worse with: walking, bending, sitting, inactivity, standing and some activites Pain improves with: rest and medication Relief from Meds: 6  Mobility walk with assistance use a cane ability to climb steps?  yes do you drive?  yes  Function disabled: date disabled 2005  Neuro/Psych weakness numbness tingling spasms dizziness depression anxiety  Prior Studies Any changes since last visit?  no  Physicians involved in your care Any changes since last visit?  no   Family History  Problem  Relation Age of Onset  . COPD Mother   . Cancer Mother   . Dementia Mother   . Cancer Father   . CAD Father 84  . Cancer Brother   . Cirrhosis Brother    Social History   Social History  . Marital status: Married    Spouse name: N/A  . Number of children: 2  . Years of education: N/A   Occupational History  . DISABLED Unemployed   Social History Main Topics  . Smoking status: Current Every Day Smoker    Packs/day: 1.00    Years: 35.00    Types: Cigarettes  . Smokeless tobacco: Never Used     Comment: trying to cut back  . Alcohol use No  . Drug use: No  . Sexual activity: Not on file   Other Topics Concern  . Not on file   Social History Narrative   Lives at home with husband   Past Surgical History:  Procedure Laterality Date  . CERVICAL SPINE SURGERY     x 2  . FOOT SURGERY  2009   Past Medical History:  Diagnosis Date  . Arthritis   . COPD (chronic obstructive pulmonary disease) ([place])   . Dysthymia   . GERD (gastroesophageal reflux disease)   . IBS (irritable bowel syndrome)   . Osteoporosis   . Other chronic pain   . Smoker    There were no vitals taken for this visit.  Opioid Risk Score:   Fall Risk Score:  `1  Depression screen PHQ 2/9  Depression screen [facility] 2/9 02/08/2017  07/21/2016 06/23/2016  Decreased Interest 3 - 3  Down, Depressed, Hopeless 3 - 3  PHQ - 2 Score 6 - 6  Altered sleeping - 3 3  Tired, decreased energy - 3 3  Change in appetite - - 0  Feeling bad or failure about yourself  - - 2  Trouble concentrating - - 2  Moving slowly or fidgety/restless - - 2  Suicidal thoughts - - 0  PHQ-9 Score - - 18  Difficult doing work/chores - - Very difficult     Review of Systems  Constitutional: Positive for unexpected weight change.  Eyes: Negative.   Respiratory: Positive for shortness of breath.   Cardiovascular: Negative.   Gastrointestinal: Positive for abdominal pain, constipation and nausea.  Endocrine: Negative.     Genitourinary: Negative.   Musculoskeletal: Positive for joint swelling.  Skin: Negative.   Allergic/Immunologic: Negative.   Neurological: Negative.   Hematological: Bruises/bleeds easily.  Psychiatric/Behavioral: Negative.   All other systems reviewed and are negative.      Objective:   Physical Exam  Constitutional: She is oriented to person, place, and time. She appears well-developed and well-nourished.  HENT:  Head: Normocephalic and atraumatic.  Neck: Normal range of motion. Neck supple.  Cervical Paraspinal Tenderness: C-5-C-6  Cardiovascular: Normal rate and regular rhythm.   Pulmonary/Chest: Effort normal and breath sounds normal.  Musculoskeletal:  Normal Muscle Bulk and Muscle Testing Reveals: Upper Extremities: Right: Full ROM and Muscle Strength 5/5 Left: Decreased ROM 45 Degrees and Muscle Strength 4/5 Left: AC Joint Tenderness Thoracic Paraspinal Tenderness: T-1-T-3 Mainly Left Side Lumbar Paraspinal Tenderness: L-3-L-5 Lower Extremities: Full ROM and Muscle Strength 5/5 Arises from Table with ease Narrow Based Gait  Neurological: She is alert and oriented to person, place, and time.  Skin: Skin is warm and dry.  Psychiatric: She has a normal mood and affect.  Nursing note and vitals reviewed.         Assessment & Plan:  1. Cervical spondylolysis with chronic neck pain/ Cervical Radiculopathy: She hasdecreased range of motion, chronic left upper extremity pain. Patient s/p cervical spine surgery per Dr Patrice Paradise on Mar 09, 2012.  Continue Zonegran. 07/02/2017 2. Lumbar degenerative disc disease as well as facet arthropathy and chronic low back pain. Continue HEP. 07/02/2017 3. 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 07/02/2017 4. Left Greater trochantericbursitis: S/PCortisone Injection on 05/18/2017  no Relief was  Noted. Continue Ice Therapy and HEP. 07/02/2017 5. Chronic pain syndrome:Continue Flexeril.  07/02/2017 Refilled oxycodone 15mg  one tablet QID as needed #120.  We will continue the opioid monitoring program, this consists of regular clinic visits, examinations, urine drug screen, pill counts as well as use of [place] Controlled Substance Reporting System.  6. Depression/anxiety-: Psychiatry Following: Continue Klonopin.07/02/2017 7. Muscle Spasm: Continue Flexeril. 07/02/2017.  30 minutes of face to face patient care time was spent during this visit. All questions were encouraged and answered.  F/U in 1 month

## 2017-07-07 ENCOUNTER — Telehealth: Payer: Self-pay

## 2017-07-07 MED ORDER — NALOXEGOL OXALATE 25 MG PO TABS: 25.0000 mg | ORAL_TABLET | Freq: Every day | ORAL | 2 refills | Status: DC

## 2017-07-07 NOTE — Telephone Encounter (Signed)
Patient called and stated that the movantic samples are working for her and would like a prescription for this medication

## 2017-07-07 NOTE — Telephone Encounter (Signed)
Ms. Schlereth having good bowel movements with Movantik, Movantik ordered. PA will be performed today.

## 2017-07-09 ENCOUNTER — Telehealth: Payer: Self-pay | Admitting: *Deleted

## 2017-07-09 NOTE — Telephone Encounter (Signed)
[employer] called and Movantik 25 mg has been approved 07/09/17 -07/09/18. [place] notified.

## 2017-07-23 ENCOUNTER — Encounter: Payer: Self-pay | Admitting: Registered Nurse

## 2017-07-23 ENCOUNTER — Encounter: Payer: Medicare Other | Attending: Physical Medicine & Rehabilitation | Admitting: Registered Nurse

## 2017-07-23 VITALS — BP 114/81 | HR 92 | Resp 14

## 2017-07-23 DIAGNOSIS — M4302 Spondylolysis, cervical region: Secondary | ICD-10-CM | POA: Insufficient documentation

## 2017-07-23 DIAGNOSIS — K5903 Drug induced constipation: Secondary | ICD-10-CM | POA: Diagnosis not present

## 2017-07-23 DIAGNOSIS — Z9889 Other specified postprocedural states: Secondary | ICD-10-CM | POA: Diagnosis not present

## 2017-07-23 DIAGNOSIS — M542 Cervicalgia: Secondary | ICD-10-CM

## 2017-07-23 DIAGNOSIS — G8929 Other chronic pain: Secondary | ICD-10-CM | POA: Insufficient documentation

## 2017-07-23 DIAGNOSIS — M5416 Radiculopathy, lumbar region: Secondary | ICD-10-CM

## 2017-07-23 DIAGNOSIS — T402X5A Adverse effect of other opioids, initial encounter: Secondary | ICD-10-CM | POA: Diagnosis not present

## 2017-07-23 DIAGNOSIS — M47816 Spondylosis without myelopathy or radiculopathy, lumbar region: Secondary | ICD-10-CM

## 2017-07-23 DIAGNOSIS — Z79899 Other long term (current) drug therapy: Secondary | ICD-10-CM

## 2017-07-23 DIAGNOSIS — M7062 Trochanteric bursitis, left hip: Secondary | ICD-10-CM

## 2017-07-23 DIAGNOSIS — M4696 Unspecified inflammatory spondylopathy, lumbar region: Secondary | ICD-10-CM | POA: Diagnosis not present

## 2017-07-23 DIAGNOSIS — G894 Chronic pain syndrome: Secondary | ICD-10-CM

## 2017-07-23 DIAGNOSIS — M5412 Radiculopathy, cervical region: Secondary | ICD-10-CM | POA: Diagnosis not present

## 2017-07-23 DIAGNOSIS — Z5181 Encounter for therapeutic drug level monitoring: Secondary | ICD-10-CM | POA: Diagnosis not present

## 2017-07-23 DIAGNOSIS — M961 Postlaminectomy syndrome, not elsewhere classified: Secondary | ICD-10-CM

## 2017-07-23 MED ORDER — OXYCODONE HCL 15 MG PO TABS: 15.0000 mg | ORAL_TABLET | Freq: Four times a day (QID) | ORAL | 0 refills | Status: DC | PRN

## 2017-07-23 NOTE — Progress Notes (Signed)
Subjective:    Patient ID: Allison Lee, female    DOB: 18-Mar-1956, 61 y.o.   MRN: [id number]  HPI: Allison Lee is a 61year old female who returns for follow up appointment and medication refill. She states her pain is located in her neck radiating into her left  shoulder and left arm with tingling and numbness.Also states she haslower back painradiating in her leftlower extremity and left foot. She rates her pain 6. Her current exercise regime is performing stretching exercises with bands and walking.   Allison Lee Oral Swab was performed on 06/04/2017 it was consistent.  Pain Inventory Average Pain 7 Pain Right Now 6 My pain is constant, sharp, burning, stabbing, tingling and aching  In the last 24 hours, has pain interfered with the following? General activity 9 Relation with others 9 Enjoyment of life 9 What TIME of day is your pain at its worst? all Sleep (in general) Poor  Pain is worse with: walking, bending, sitting, inactivity, standing and some activites Pain improves with: rest and medication Relief from Meds: 6  Mobility walk with assistance use a cane ability to climb steps?  yes do you drive?  yes transfers alone  Function disabled: date disabled 2005 I need assistance with the following:  meal prep, household duties and shopping Do you have any goals in this area?  no  Neuro/Psych weakness numbness tremor tingling trouble walking spasms depression anxiety  Prior Studies Any changes since last visit?  no  Physicians involved in your care Any changes since last visit?  no   Family History  Problem Relation Age of Onset  . COPD Mother   . Cancer Mother   . Dementia Mother   . Cancer Father   . CAD Father 61  . Cancer Brother   . Cirrhosis Brother    Social History   Social History  . Marital status: Married    Spouse name: N/A  . Number of children: 2  . Years of education: N/A   Occupational History  . DISABLED  Unemployed   Social History Main Topics  . Smoking status: Current Every Day Smoker    Packs/day: 1.00    Years: 35.00    Types: Cigarettes  . Smokeless tobacco: Never Used     Comment: trying to cut back  . Alcohol use No  . Drug use: No  . Sexual activity: Not Asked   Other Topics Concern  . None   Social History Narrative   Lives at home with husband   Past Surgical History:  Procedure Laterality Date  . CERVICAL SPINE SURGERY     x 2  . FOOT SURGERY  2009   Past Medical History:  Diagnosis Date  . Arthritis   . COPD (chronic obstructive pulmonary disease) ([place])   . Dysthymia   . GERD (gastroesophageal reflux disease)   . IBS (irritable bowel syndrome)   . Osteoporosis   . Other chronic pain   . Smoker    There were no vitals taken for this visit.  Opioid Risk Score:   Fall Risk Score:  `1  Depression screen PHQ 2/9  Depression screen [facility] 2/9 02/08/2017 07/21/2016 06/23/2016  Decreased Interest 3 - 3  Down, Depressed, Hopeless 3 - 3  PHQ - 2 Score 6 - 6  Altered sleeping - 3 3  Tired, decreased energy - 3 3  Change in appetite - - 0  Feeling bad or failure about yourself  - - 2  Trouble concentrating - - 2  Moving slowly or fidgety/restless - - 2  Suicidal thoughts - - 0  PHQ-9 Score - - 18  Difficult doing work/chores - - Very difficult     Review of Systems  Constitutional: Positive for unexpected weight change.  HENT: Negative.   Eyes: Negative.   Respiratory: Positive for shortness of breath.   Cardiovascular: Negative.   Gastrointestinal: Positive for constipation, nausea and vomiting.  Endocrine: Negative.   Genitourinary: Negative.   Musculoskeletal: Positive for arthralgias, back pain, gait problem, joint swelling, myalgias, neck pain and neck stiffness.       Spasms   Skin: Negative.   Allergic/Immunologic: Negative.   Neurological: Positive for tremors, weakness and numbness.       Tingling   Hematological: Bruises/bleeds easily.    Psychiatric/Behavioral: Positive for dysphoric mood. The patient is nervous/anxious.   All other systems reviewed and are negative.      Objective:   Physical Exam  Constitutional: She is oriented to person, place, and time. She appears well-developed and well-nourished.  HENT:  Head: Normocephalic and atraumatic.  Neck: Normal range of motion. Neck supple.  Cervical Paraspinal Tenderness: C-5-C-6  Cardiovascular: Normal rate and regular rhythm.   Pulmonary/Chest: Effort normal and breath sounds normal.  Musculoskeletal:  Normal Muscle Bulk and Muscle Testing Reveals: Upper Extremities: Right: Full ROM and Muscle Strength 5/5 Bilateral AC Joint Tenderness: L>R Thoracic Hypersensitivity : T-1-T-7  Mainly Left Side Lumbar Hypersensitivity Lower Extremities: Full ROM and Muscle Strength 5/5 Left Lower Extremity Flexion Produces Pain into her Lumber, Left Hip and Groin Arises from Table slowly using straight cane for support Antalgic Gait  Neurological: She is alert and oriented to person, place, and time.  Skin: Skin is warm and dry.  Psychiatric: She has a normal mood and affect.  Nursing note and vitals reviewed.         Assessment & Plan:  1. Cervical spondylolysis with chronic neck pain/ Cervical Radiculopathy: She hasdecreased range of motion, chronic left upper extremity pain. Patient s/p cervical spine surgery per Dr Patrice Paradise on Mar 09, 2012.  Continue Zonegran. 07/23/2017 2. Lumbar degenerative disc disease as well as facet arthropathy and chronic low back pain. Continue HEP. 07/23/2017 3. 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 07/23/2017 4. Left Greater trochantericbursitis: S/PCortisone Injection on 05/18/2017  no Relief was  Noted. Continue Ice Therapy and HEP. 07/23/2017 5. Chronic pain syndrome: 07/23/2017 Refilled oxycodone 15mg  one tablet QID as needed #120.  We will continue the opioid monitoring program, this  consists of regular clinic visits, examinations, urine drug screen, pill counts as well as use of [place] Controlled Substance Reporting System.  6. Depression/anxiety-: Psychiatry Following: Continue Klonopin.07/23/2017 7. Muscle Spasm: Continue Flexeril. 07/23/2017.  20 minutes of face to face patient care time was spent during this visit. All questions were encouraged and answered.   F/U in 1 month

## 2017-07-25 ENCOUNTER — Telehealth: Payer: Self-pay | Admitting: Registered Nurse

## 2017-07-25 NOTE — Telephone Encounter (Signed)
On 07/25/2017 the  [place] was reviewed no conflict was seen on the [place] with multiple prescribers. Allison Lee has a signed narcotic contract with our office. If there were any discrepancies this would have been reported to her physician.

## 2017-08-21 ENCOUNTER — Encounter (HOSPITAL_COMMUNITY): Payer: Self-pay

## 2017-08-21 ENCOUNTER — Emergency Department (HOSPITAL_COMMUNITY): Payer: Medicare Other

## 2017-08-21 ENCOUNTER — Emergency Department (HOSPITAL_COMMUNITY)
Admission: EM | Admit: 2017-08-21 | Discharge: 2017-08-21 | Disposition: A | Discharge status: Home / Self Care | Payer: Medicare Other | Attending: Emergency Medicine | Admitting: Emergency Medicine

## 2017-08-21 DIAGNOSIS — R0602 Shortness of breath: Secondary | ICD-10-CM | POA: Diagnosis present

## 2017-08-21 DIAGNOSIS — Z79899 Other long term (current) drug therapy: Secondary | ICD-10-CM | POA: Diagnosis not present

## 2017-08-21 DIAGNOSIS — J441 Chronic obstructive pulmonary disease with (acute) exacerbation: Secondary | ICD-10-CM | POA: Insufficient documentation

## 2017-08-21 DIAGNOSIS — F17210 Nicotine dependence, cigarettes, uncomplicated: Secondary | ICD-10-CM | POA: Insufficient documentation

## 2017-08-21 DIAGNOSIS — R062 Wheezing: Secondary | ICD-10-CM | POA: Insufficient documentation

## 2017-08-21 LAB — CBC
HEMATOCRIT: 43.3 % (ref 36.0–46.0)
Hemoglobin: 14.7 g/dL (ref 12.0–15.0)
MCH: 31.5 pg (ref 26.0–34.0)
MCHC: 33.9 g/dL (ref 30.0–36.0)
MCV: 92.7 fL (ref 78.0–100.0)
PLATELETS: 269 10*3/uL (ref 150–400)
RBC: 4.67 MIL/uL (ref 3.87–5.11)
RDW: 14.9 % (ref 11.5–15.5)
WBC: 9.7 10*3/uL (ref 4.0–10.5)

## 2017-08-21 LAB — BASIC METABOLIC PANEL
Anion gap: 11 (ref 5–15)
BUN: 11 mg/dL (ref 6–20)
CHLORIDE: 98 mmol/L — AB (ref 101–111)
CO2: 28 mmol/L (ref 22–32)
CREATININE: 0.86 mg/dL (ref 0.44–1.00)
Calcium: 8.7 mg/dL — ABNORMAL LOW (ref 8.9–10.3)
GFR calc non Af Amer: >60 mL/min (ref >60)
Glucose, Bld: 77 mg/dL (ref 65–99)
Potassium: 3.1 mmol/L — ABNORMAL LOW (ref 3.5–5.1)
Sodium: 137 mmol/L (ref 135–145)

## 2017-08-21 LAB — I-STAT TROPONIN, ED: Troponin i, poc: 0 ng/mL (ref 0.00–0.08)

## 2017-08-21 LAB — D-DIMER, QUANTITATIVE: D-Dimer, Quant: 0.53 ug/mL-FEU — ABNORMAL HIGH (ref 0.00–0.50)

## 2017-08-21 MED ORDER — POTASSIUM CHLORIDE CRYS ER 20 MEQ PO TBCR
40.0000 meq | EXTENDED_RELEASE_TABLET | Freq: Once | ORAL | Status: AC
  Administered 2017-08-21: 40 meq via ORAL
  Filled 2017-08-21: qty 2

## 2017-08-21 MED ORDER — SODIUM CHLORIDE 0.9 % IV BOLUS (SEPSIS)
500.0000 mL | Freq: Once | INTRAVENOUS | Status: AC
  Administered 2017-08-21: 500 mL via INTRAVENOUS

## 2017-08-21 MED ORDER — METHYLPREDNISOLONE SODIUM SUCC 125 MG IJ SOLR
125.0000 mg | Freq: Once | INTRAMUSCULAR | Status: AC
  Administered 2017-08-21: 125 mg via INTRAVENOUS
  Filled 2017-08-21: qty 2

## 2017-08-21 MED ORDER — PREDNISONE 20 MG PO TABS: ORAL_TABLET | ORAL | 0 refills | Status: DC

## 2017-08-21 MED ORDER — IOPAMIDOL (ISOVUE-370) INJECTION 76%
INTRAVENOUS | Status: AC
  Administered 2017-08-21: 100 mL via INTRAVENOUS
  Filled 2017-08-21: qty 100

## 2017-08-21 MED ORDER — ALBUTEROL SULFATE (2.5 MG/3ML) 0.083% IN NEBU
5.0000 mg | INHALATION_SOLUTION | Freq: Once | RESPIRATORY_TRACT | Status: AC
  Administered 2017-08-21: 5 mg via RESPIRATORY_TRACT
  Filled 2017-08-21: qty 6

## 2017-08-21 MED ORDER — BENZONATATE 100 MG PO CAPS: 100.0000 mg | ORAL_CAPSULE | Freq: Three times a day (TID) | ORAL | 0 refills | Status: DC

## 2017-08-21 NOTE — ED Triage Notes (Signed)
Patient reports that she was sent from [facility] for COPD ongoing chest tightness and SOB x 1 week. Patient has been taking steroids and antibiotics with no relief

## 2017-08-21 NOTE — ED Notes (Signed)
Pt stable, ambulatory, states understanding of discharge instructions 

## 2017-08-21 NOTE — ED Provider Notes (Signed)
[facility] EMERGENCY DEPARTMENT Provider Note   CSN: [id number] Arrival date & time: 08/21/17  4034     History   Chief Complaint No chief complaint on file.   HPI Allison Lee is a 61 y.o. female.  Patient is a 61 year old female with a history of COPDwho presents with cough and shortness of breath. She's had cough and increased shortness breath over the last 3 weeks. [facility]. The first time she was started on Levaquin as well as prednisone and Tessalon Perles. She was dispensed an albuterol inhaler which she has been using. She still feels like she has an ongoing cough. The [employer] have been helping but she is almost out of them. She also has some ongoing wheezing and chest tightness. She feels better after the albuterol but then it comes back again. She has some shortness of breath when she is up walking around. She has some leg swelling that is chronic for her and unchanged from her baseline. She does have a history of a prior pulmonary embolus but is not currently on anticoagulants. She's not sure what led to the prior PE. Her cough is mostly nonproductive but occasionally she has some thick white sputum. She had fevers early on in the course but no recent fevers. No vomiting.      Past Medical History:  Diagnosis Date  . Arthritis   . COPD (chronic obstructive pulmonary disease) ([place])   . Dysthymia   . GERD (gastroesophageal reflux disease)   . IBS (irritable bowel syndrome)   . Osteoporosis   . Other chronic pain   . Smoker     Patient Active Problem List   Diagnosis Date Noted  . Bilateral carpal tunnel syndrome 09/30/2016  . Lumbar facet arthropathy 06/23/2016  . Trochanteric bursitis of left hip 06/23/2016  . Cervical post-laminectomy syndrome 06/23/2016  . PE (pulmonary embolism) 08/24/2014  . Chest pain 08/24/2014  . Pleuritic chest pain 08/24/2014  . Hypokalemia 08/24/2014  . Hypomagnesemia  08/24/2014  . Medication monitoring encounter 10/21/2012  . Medication management 10/21/2012  . Cervicalgia 02/29/2012  . Lumbar pain with radiation down left leg 02/29/2012  . Neuropathic pain, arm 02/29/2012  . Elevated BP 01/05/2012  . Chronic pain disorder 01/05/2012  . GERD (gastroesophageal reflux disease) 01/04/2012  . Depression 01/04/2012  . COPD (chronic obstructive pulmonary disease) ([place])   . Osteoporosis   . IBS (irritable bowel syndrome)     Past Surgical History:  Procedure Laterality Date  . CERVICAL SPINE SURGERY     x 2  . FOOT SURGERY  2009    OB History    No data available       Home Medications    Prior to Admission medications   Medication Sig Start Date End Date Taking? Authorizing Provider  benzonatate (TESSALON) 100 MG capsule Take 100 mg by mouth every 8 (eight) hours as needed. 08/14/17  Yes [provider]  Calcium-Vitamin D 500-100 MG-UNIT WAFR Take 1 each by mouth 2 (two) times daily.    Yes [provider]  clonazePAM (KLONOPIN) 1 MG tablet Take 1 mg by mouth 2 (two) times daily.    Yes [provider]  cyclobenzaprine (FLEXERIL) 10 MG tablet TAKE ONE (1) TABLET BY MOUTH 3 TIMES DAILY Patient taking differently: TAKE ONE 10 mg TABLET BY MOUTH 3 TIMES DAILY 04/26/17  Yes Meredith Staggers, MD  furosemide (LASIX) 20 MG tablet Take 1 or  2 daily. Use as needed for foot and leg swelling Patient taking differently: Take 20-40 mg by mouth 3 times/day as needed-between meals & bedtime. Take 1 or 2 daily. Use as needed for foot and leg swelling 02/25/17  Yes Copland, Gay Filler, MD  naloxegol oxalate (MOVANTIK) 25 MG TABS tablet Take 1 tablet (25 mg total) by mouth daily. 07/07/17  Yes Bayard Hugger, NP  omeprazole (PRILOSEC) 40 MG capsule Take 40 mg by mouth daily.  04/27/17  Yes [provider]  oxyCODONE (ROXICODONE) 15 MG immediate release tablet Take 1 tablet (15 mg total) by mouth 4 (four) times daily as needed.  07/23/17  Yes Bayard Hugger, NP  benzonatate (TESSALON) 100 MG capsule Take 1 capsule (100 mg total) by mouth every 8 (eight) hours. 08/21/17   Malvin Johns, MD  predniSONE (DELTASONE) 20 MG tablet 2 tabs po daily x 4 days 08/21/17   Malvin Johns, MD  traZODone (DESYREL) 100 MG tablet Take 2 tablets (200 mg total) by mouth at bedtime. 11/11/16   Meredith Staggers, MD  zonisamide (ZONEGRAN) 100 MG capsule TAKE TWO CAPSULES BY MOUTH AT BEDTIME Patient taking differently: TAKE 200 mg CAPSULES BY MOUTH AT BEDTIME 07/02/17   Meredith Staggers, MD    Family History Family History  Problem Relation Age of Onset  . COPD Mother   . Cancer Mother   . Dementia Mother   . Cancer Father   . CAD Father 64  . Cancer Brother   . Cirrhosis Brother     Social History Social History  Substance Use Topics  . Smoking status: Current Every Day Smoker    Packs/day: 1.00    Years: 35.00    Types: Cigarettes  . Smokeless tobacco: Never Used     Comment: trying to cut back  . Alcohol use No     Allergies   Methadone; Hydrocodone; Topamax; Hctz [hydrochlorothiazide]; Neurontin [gabapentin]; and Pregabalin   Review of Systems Review of Systems  Constitutional: Positive for fatigue. Negative for chills, diaphoresis and fever.  HENT: Negative for congestion, rhinorrhea and sneezing.   Eyes: Negative.   Respiratory: Positive for cough, chest tightness, shortness of breath and wheezing.   Cardiovascular: Positive for leg swelling. Negative for chest pain.  Gastrointestinal: Negative for abdominal pain, blood in stool, diarrhea, nausea and vomiting.  Genitourinary: Negative for difficulty urinating, flank pain, frequency and hematuria.  Musculoskeletal: Negative for arthralgias and back pain.  Skin: Negative for rash.  Neurological: Negative for dizziness, speech difficulty, weakness, numbness and headaches.     Physical Exam Updated Vital Signs BP 90/67   Pulse 85   Temp 97.7 F (36.5 C)  (Oral)   SpO2 97%   Physical Exam  Constitutional: She is oriented to person, place, and time. She appears well-developed and well-nourished.  HENT:  Head: Normocephalic and atraumatic.  Eyes: Pupils are equal, round, and reactive to light.  Neck: Normal range of motion. Neck supple.  Cardiovascular: Normal rate, regular rhythm and normal heart sounds.   Pulmonary/Chest: Effort normal. No respiratory distress. She has wheezes. She has no rales. She exhibits no tenderness.  Mild expiratory wheezes bilaterally with no increased work of breathing  Abdominal: Soft. Bowel sounds are normal. There is no tenderness. There is no rebound and no guarding.  Musculoskeletal: Normal range of motion. She exhibits no edema.  No significant edema or calf tenderness  Lymphadenopathy:    She has no cervical adenopathy.  Neurological: She is alert and oriented  to person, place, and time.  Skin: Skin is warm and dry. No rash noted.  Psychiatric: She has a normal mood and affect.     ED Treatments / Results  Labs (all labs ordered are listed, but only abnormal results are displayed) Labs Reviewed  BASIC METABOLIC PANEL - Abnormal; Notable for the following:       Result Value   Potassium 3.1 (*)    Chloride 98 (*)    Calcium 8.7 (*)    All other components within normal limits  D-DIMER, QUANTITATIVE (NOT AT [facility]) - Abnormal; Notable for the following:    D-Dimer, Quant 0.53 (*)    All other components within normal limits  CBC  I-STAT TROPONIN, ED    EKG  EKG Interpretation  Date/Time:  Saturday August 21 2017 09:45:43 EDT Ventricular Rate:  106 PR Interval:  124 QRS Duration: 80 QT Interval:  324 QTC Calculation: 430 R Axis:   84 Text Interpretation:  Sinus tachycardia Otherwise normal ECG SINCE LAST TRACING HEART RATE HAS INCREASED Confirmed by Malvin Johns [phone]) on 08/21/2017 12:02:51 PM       Radiology Dg Chest 2 View  Result Date: 08/21/2017 CLINICAL DATA:  Chest pain,  cough, and shortness breath for 1 week. COPD. EXAM: CHEST  2 VIEW COMPARISON:  08/23/2014 FINDINGS: The heart size and mediastinal contours are within normal limits. Aortic atherosclerosis. Pulmonary hyperinflation again seen, consistent with COPD. Both lungs are clear. Cervical spine fusion hardware again noted. IMPRESSION: Stable exam.  COPD.  No active cardiopulmonary disease. Electronically Signed   By: Earle Gell M.D.   On: 08/21/2017 10:23   Ct Angio Chest Pe W/cm &/or Wo Cm  Result Date: 08/21/2017 CLINICAL DATA:  COPD and chest tightness/ shortness of breath for 1 week. Positive D-dimer. EXAM: CT ANGIOGRAPHY CHEST WITH CONTRAST TECHNIQUE: Multidetector CT imaging of the chest was performed using the standard protocol during bolus administration of intravenous contrast. Multiplanar CT image reconstructions and MIPs were obtained to evaluate the vascular anatomy. CONTRAST:  100 cc of Isovue 370 COMPARISON:  Plain film 08/21/2017.  CT 08/23/2014. FINDINGS: Cardiovascular: The quality of this exam for evaluation of pulmonary embolism is good. Minimal motion degradation inferiorly. No pulmonary embolism to the segmental level. Aortic and branch vessel atherosclerosis. Normal heart size, without pericardial effusion. Lad coronary artery atherosclerosis on image 70/series 5. Mediastinum/Nodes: No mediastinal or hilar adenopathy. Lungs/Pleura: No pleural fluid.  Mild centrilobular emphysema. Minimal subsegmental atelectasis in the left lower lobe and lingula dependently. Upper Abdomen: Normal imaged portions of the liver, spleen, stomach, pancreas, adrenal glands. Musculoskeletal: Lower cervical spine fixation. Probable bone island in the posterior aspect of the T6 vertebral body, similar to the prior CT. Review of the MIP images confirms the above findings. IMPRESSION: 1.  No evidence of pulmonary embolism. 2.  Emphysema (ICD10-J43.9). 3. Age advanced coronary artery atherosclerosis. Recommend assessment of  coronary risk factors and consideration of medical therapy. 4.  Aortic Atherosclerosis (ICD10-I70.0). Electronically Signed   By: Abigail Miyamoto M.D.   On: 08/21/2017 14:54    Procedures Procedures (including critical care time)  Medications Ordered in ED Medications  potassium chloride SA (K-DUR,KLOR-CON) CR tablet 40 mEq (40 mEq Oral Given 08/21/17 1228)  albuterol (PROVENTIL) (2.5 MG/3ML) 0.083% nebulizer solution 5 mg (5 mg Nebulization Given 08/21/17 1228)  sodium chloride 0.9 % bolus 500 mL (0 mLs Intravenous Stopped 08/21/17 1329)  methylPREDNISolone sodium succinate (SOLU-MEDROL) 125 mg/2 mL injection 125 mg (125 mg Intravenous Given 08/21/17 1358)  sodium chloride 0.9 % bolus 500 mL (0 mLs Intravenous Stopped 08/21/17 1526)  iopamidol (ISOVUE-370) 76 % injection (100 mLs Intravenous Contrast Given 08/21/17 1424)     Initial Impression / Assessment and Plan / ED Course  I have reviewed the triage vital signs and the nursing notes.  Pertinent labs & imaging results that were available during my care of the patient were reviewed by me and considered in my medical decision making (see chart for details).     Patient presents with shortness of breath and wheezing consistent with her prior COPD exacerbations. Her chest x-rays negative for pneumonia. She did have an elevated d-dimer and has a history of pulmonary embolus so a CT scan was performed which has no evidence of pulmonary emboli. She does have coronary artery disease. I did discuss these findings with the patient and stressed the importance of outpatient follow-up and smoking cessation. She had a breathing treatment in the ED and a dose of Solu-Medrol. She's feeling much better feels like her breathing is more back to baseline. She was discharged home in good condition. She was given prescriptions for [facility] which she says she is almost out of as well as a 4 day course of prednisone. She hasn't been on prednisone in several  days. She's artery finished her antibiotics that were prescribed by urgent care. She was discharged home in good condition. She was encouraged to follow-up with her PCP. Return precautions were given. Of note her blood pressure is on the low side. She states that it's been consistently running in the 90s. She's not on any blood pressure medications. She is not symptomatic from this.  Final Clinical Impressions(s) / ED Diagnoses   Final diagnoses:  COPD exacerbation (HCC)    New Prescriptions New Prescriptions   BENZONATATE (TESSALON) 100 MG CAPSULE    Take 1 capsule (100 mg total) by mouth every 8 (eight) hours.   PREDNISONE (DELTASONE) 20 MG TABLET    2 tabs po daily x 4 days     Malvin Johns, MD 08/21/17 [phone]

## 2017-08-24 ENCOUNTER — Encounter: Payer: Medicare Other | Attending: Physical Medicine & Rehabilitation | Admitting: Registered Nurse

## 2017-08-24 ENCOUNTER — Encounter: Payer: Self-pay | Admitting: Registered Nurse

## 2017-08-24 VITALS — BP 94/65 | HR 112

## 2017-08-24 DIAGNOSIS — M5412 Radiculopathy, cervical region: Secondary | ICD-10-CM

## 2017-08-24 DIAGNOSIS — T402X5A Adverse effect of other opioids, initial encounter: Secondary | ICD-10-CM | POA: Diagnosis not present

## 2017-08-24 DIAGNOSIS — Z9889 Other specified postprocedural states: Secondary | ICD-10-CM | POA: Diagnosis not present

## 2017-08-24 DIAGNOSIS — M7062 Trochanteric bursitis, left hip: Secondary | ICD-10-CM | POA: Diagnosis not present

## 2017-08-24 DIAGNOSIS — G8929 Other chronic pain: Secondary | ICD-10-CM | POA: Diagnosis not present

## 2017-08-24 DIAGNOSIS — K5903 Drug induced constipation: Secondary | ICD-10-CM | POA: Diagnosis not present

## 2017-08-24 DIAGNOSIS — M5416 Radiculopathy, lumbar region: Secondary | ICD-10-CM

## 2017-08-24 DIAGNOSIS — M961 Postlaminectomy syndrome, not elsewhere classified: Secondary | ICD-10-CM | POA: Diagnosis not present

## 2017-08-24 DIAGNOSIS — M542 Cervicalgia: Secondary | ICD-10-CM

## 2017-08-24 DIAGNOSIS — M47816 Spondylosis without myelopathy or radiculopathy, lumbar region: Secondary | ICD-10-CM

## 2017-08-24 DIAGNOSIS — Z5181 Encounter for therapeutic drug level monitoring: Secondary | ICD-10-CM | POA: Diagnosis not present

## 2017-08-24 DIAGNOSIS — Z79899 Other long term (current) drug therapy: Secondary | ICD-10-CM | POA: Diagnosis not present

## 2017-08-24 DIAGNOSIS — M4302 Spondylolysis, cervical region: Secondary | ICD-10-CM | POA: Diagnosis not present

## 2017-08-24 DIAGNOSIS — G894 Chronic pain syndrome: Secondary | ICD-10-CM | POA: Diagnosis not present

## 2017-08-24 MED ORDER — ZONISAMIDE 100 MG PO CAPS: 200.0000 mg | ORAL_CAPSULE | Freq: Every day | ORAL | 2 refills | Status: DC

## 2017-08-24 MED ORDER — TRAZODONE HCL 100 MG PO TABS: 200.0000 mg | ORAL_TABLET | Freq: Every day | ORAL | 3 refills | Status: DC

## 2017-08-24 MED ORDER — CYCLOBENZAPRINE HCL 10 MG PO TABS: 10.0000 mg | ORAL_TABLET | Freq: Three times a day (TID) | ORAL | 3 refills | Status: DC | PRN

## 2017-08-24 MED ORDER — OXYCODONE HCL 15 MG PO TABS: 15.0000 mg | ORAL_TABLET | Freq: Four times a day (QID) | ORAL | 0 refills | Status: DC | PRN

## 2017-08-24 NOTE — Progress Notes (Signed)
Subjective:    Patient ID: Allison Lee, female    DOB: 05-30-1956, 61 y.o.   MRN: [id number]  HPI: Allison Lee is a 61year old female who returns for follow up appointment and medication refill. She states her pain is located in her neck radiating into her left shoulder and left arm with tingling and numbness.Also reports lower back painradiating in her leftlower extremity and left foot. Also states she has generalized pain all over.  She rates her pain 6. Her current exercise regime is performing stretching exercises with bands and walking.   Allison Lee went to [facility] Department on 08/21/17 for SOB and Cough: She was diagnosed with COPD Exacerbation, note reviewed.   Allison Lee Morphine equivalent is  90.00 MME.She/he  is also prescribed Klonopin by Allison Lee. We have discussed the black box warning of using opioids and benzodiazepines.I highlighted the dangers of using these drugs together and discussed the adverse events including respiratory suppression, overdose, cognitive impairment and importance of  compliance with current regimen. She verbalizes understanding, we will continue to monitor and adjust as indicated.   She is being closely monitored and under the care of he psychiatrist.  [name] Oral Swab was performed on 06/04/2017 it was consistent.  Pain Inventory Average Pain 6 Pain Right Now 6 My pain is constant, sharp, burning, dull, stabbing, tingling and aching  In the last 24 hours, has pain interfered with the following? General activity 9 Relation with others 10 Enjoyment of life 10 What TIME of day is your pain at its worst? all Sleep (in general) Poor  Pain is worse with: walking, bending, sitting, standing and some activites Pain improves with: rest and medication Relief from Meds: 6  Mobility walk with assistance use a cane ability to climb steps?  yes do you drive?  yes transfers alone  Function disabled: date  disabled 2005 I need assistance with the following:  dressing, bathing, meal prep, household duties and shopping Do you have any goals in this area?  no  Neuro/Psych weakness numbness tingling spasms depression anxiety  Prior Studies Any changes since last visit?  yes  Physicians involved in your care Any changes since last visit?  yes   Family History  Problem Relation Age of Onset  . COPD Mother   . Cancer Mother   . Dementia Mother   . Cancer Father   . CAD Father 47  . Cancer Brother   . Cirrhosis Brother    Social History   Social History  . Marital status: Married    Spouse name: N/A  . Number of children: 2  . Years of education: N/A   Occupational History  . DISABLED Unemployed   Social History Main Topics  . Smoking status: Current Every Day Smoker    Packs/day: 1.00    Years: 35.00    Types: Cigarettes  . Smokeless tobacco: Never Used     Comment: trying to cut back  . Alcohol use No  . Drug use: No  . Sexual activity: Not Asked   Other Topics Concern  . None   Social History Narrative   Lives at home with husband   Past Surgical History:  Procedure Laterality Date  . CERVICAL SPINE SURGERY     x 2  . FOOT SURGERY  2009   Past Medical History:  Diagnosis Date  . Arthritis   . COPD (chronic obstructive pulmonary disease) ([place])   . Dysthymia   .  GERD (gastroesophageal reflux disease)   . IBS (irritable bowel syndrome)   . Osteoporosis   . Other chronic pain   . Smoker    There were no vitals taken for this visit.  Opioid Risk Score:  1 Fall Risk Score:  `1  Depression screen PHQ 2/9  Depression screen [facility] 2/9 08/24/2017 02/08/2017 07/21/2016 06/23/2016  Decreased Interest 1 3 - 3  Down, Depressed, Hopeless 1 3 - 3  PHQ - 2 Score 2 6 - 6  Altered sleeping - - 3 3  Tired, decreased energy - - 3 3  Change in appetite - - - 0  Feeling bad or failure about yourself  - - - 2  Trouble concentrating - - - 2  Moving slowly or  fidgety/restless - - - 2  Suicidal thoughts - - - 0  PHQ-9 Score - - - 18  Difficult doing work/chores - - - Very difficult     Review of Systems  HENT: Negative.   Eyes: Negative.   Respiratory: Positive for shortness of breath.   Cardiovascular: Negative.   Gastrointestinal: Positive for constipation, nausea and vomiting.  Endocrine: Negative.   Genitourinary: Negative.   Musculoskeletal: Positive for arthralgias, back pain, gait problem, joint swelling, myalgias, neck pain and neck stiffness.       Spasms   Skin: Negative.   Allergic/Immunologic: Negative.   Neurological: Positive for tremors, weakness and numbness.       Tingling   Hematological: Bruises/bleeds easily.  Psychiatric/Behavioral: Positive for dysphoric mood. The patient is nervous/anxious.   All other systems reviewed and are negative.      Objective:   Physical Exam  Constitutional: She is oriented to person, place, and time. She appears well-developed and well-nourished.  HENT:  Head: Normocephalic and atraumatic.  Neck: Normal range of motion. Neck supple.  Cervical Paraspinal Tenderness: C-5-C-6  Cardiovascular: Normal rate and regular rhythm.   Pulmonary/Chest: Effort normal and breath sounds normal.  Musculoskeletal:  Normal Muscle Bulk and Muscle Testing Reveals: Upper Extremities:Decreased ROM 90 Degrees and Muscle Strength on the Right  5/5 Left: 4/5 Bilateral AC Joint Tenderness: L>R Thoracic Hypersensitivity : T-1-T-7  Mainly Left Side Lumbar Hypersensitivity Lower Extremities: Full ROM and Muscle Strength 5/5 Arises from Table slowly using straight cane for support Antalgic Gait  Neurological: She is alert and oriented to person, place, and time.  Skin: Skin is warm and dry.  Psychiatric: She has a normal mood and affect.  Nursing note and vitals reviewed.         Assessment & Plan:  1. Cervical Post-Laminectomy/Cervical spondylolysis with chronic neck pain/ Cervical  Radiculopathy: She hasdecreased range of motion, chronic left upper extremity pain. Patient s/p cervical spine surgery per Dr Patrice Paradise on Mar 09, 2012.  Continue Zonegran. 08/24/2017 2. Lumbar degenerative disc disease as well as facet arthropathy and chronic low back pain. Continue HEP. 08/24/2017 3. Lumbar Radiculopathy/ Neuropathic left upper extremity and left lower extremity pain: Continue Zonegran. 08/24/2017 4. Left Greater trochantericbursitis: No complaints today, Continue Ice Therapy and HEP. 08/24/2017 5. Chronic pain syndrome: 08/24/2017 Refilled oxycodone 15mg  one tablet QID as needed #120.  We will continue the opioid monitoring program, this consists of regular clinic visits, examinations, urine drug screen, pill counts as well as use of [place] Controlled Substance Reporting System.  6. Depression/anxiety-: Psychiatry Following: Continue Klonopin.08/24/2017 7. Muscle Spasm: Continue Flexeril. 08/24/2017.  20 minutes of face to face patient care time was spent during this visit. All questions were  encouraged and answered.   F/U in 1 month

## 2017-09-01 ENCOUNTER — Other Ambulatory Visit: Payer: Self-pay | Admitting: Family Medicine

## 2017-09-01 DIAGNOSIS — R600 Localized edema: Secondary | ICD-10-CM

## 2017-09-21 ENCOUNTER — Encounter: Payer: Self-pay | Admitting: Registered Nurse

## 2017-09-21 ENCOUNTER — Other Ambulatory Visit: Payer: Self-pay

## 2017-09-21 ENCOUNTER — Encounter: Payer: Medicare Other | Attending: Physical Medicine & Rehabilitation | Admitting: Registered Nurse

## 2017-09-21 ENCOUNTER — Ambulatory Visit: Payer: Medicare Other | Admitting: Registered Nurse

## 2017-09-21 VITALS — BP 100/70 | HR 102

## 2017-09-21 DIAGNOSIS — M5412 Radiculopathy, cervical region: Secondary | ICD-10-CM

## 2017-09-21 DIAGNOSIS — Z5181 Encounter for therapeutic drug level monitoring: Secondary | ICD-10-CM

## 2017-09-21 DIAGNOSIS — M542 Cervicalgia: Secondary | ICD-10-CM | POA: Diagnosis not present

## 2017-09-21 DIAGNOSIS — G8929 Other chronic pain: Secondary | ICD-10-CM | POA: Insufficient documentation

## 2017-09-21 DIAGNOSIS — M5416 Radiculopathy, lumbar region: Secondary | ICD-10-CM

## 2017-09-21 DIAGNOSIS — M4302 Spondylolysis, cervical region: Secondary | ICD-10-CM | POA: Insufficient documentation

## 2017-09-21 DIAGNOSIS — G894 Chronic pain syndrome: Secondary | ICD-10-CM

## 2017-09-21 DIAGNOSIS — M961 Postlaminectomy syndrome, not elsewhere classified: Secondary | ICD-10-CM

## 2017-09-21 DIAGNOSIS — M7062 Trochanteric bursitis, left hip: Secondary | ICD-10-CM

## 2017-09-21 DIAGNOSIS — T402X5A Adverse effect of other opioids, initial encounter: Secondary | ICD-10-CM

## 2017-09-21 DIAGNOSIS — M47816 Spondylosis without myelopathy or radiculopathy, lumbar region: Secondary | ICD-10-CM

## 2017-09-21 DIAGNOSIS — K5903 Drug induced constipation: Secondary | ICD-10-CM

## 2017-09-21 DIAGNOSIS — Z79899 Other long term (current) drug therapy: Secondary | ICD-10-CM

## 2017-09-21 DIAGNOSIS — Z9889 Other specified postprocedural states: Secondary | ICD-10-CM | POA: Insufficient documentation

## 2017-09-21 MED ORDER — OXYCODONE HCL 15 MG PO TABS: 15.0000 mg | ORAL_TABLET | Freq: Four times a day (QID) | ORAL | 0 refills | Status: DC | PRN

## 2017-09-21 NOTE — Progress Notes (Signed)
Subjective:    Patient ID: Allison Lee, female    DOB: 03-09-1956, 61 y.o.   MRN: [id number]  HPI: Allison Lee is a 61year old female who returns for follow up appointment and medication refill. She states her pain is located in her neck radiating into her left shoulder and left arm with tingling and numbness.Also reports lower back painradiating in her leftlower extremity and left foot. She rates her pain 7. Her current exercise regime is performing stretching exercises with bands and walking.   Allison Lee equivalent is  90.00 MME.She/he  is also prescribed Klonopin by [name]. We have reviewed the black box warning again in regards of  using opioids and benzodiazepines.I highlighted the dangers of using these drugs together and discussed the adverse events including respiratory suppression, overdose, cognitive impairment and importance of  compliance with current regimen. She verbalizes understanding, we will continue to monitor and adjust as indicated.  She is being closely monitored and under the care of her psychiatrist.  Allison Lee Oral Swab was performed on 06/04/2017 it was consistent.  Pain Inventory Average Pain 6 Pain Right Now 7 My pain is constant, sharp, burning, dull, stabbing, tingling and aching  In the last 24 hours, has pain interfered with the following? General activity 8 Relation with others 9 Enjoyment of life 9 What TIME of day is your pain at its worst? all Sleep (in general) Poor  Pain is worse with: walking, bending, sitting, standing and some activites Pain improves with: rest and medication Relief from Meds: 6  Mobility walk with assistance use a cane how many minutes can you walk? 3-5 ability to climb steps?  yes do you drive?  yes transfers alone  Function disabled: date disabled 2005 I need assistance with the following:  dressing, household duties and shopping Do you have any goals in this area?   no  Neuro/Psych weakness numbness tingling trouble walking spasms depression anxiety  Prior Studies Any changes since last visit?  no  Physicians involved in your care Any changes since last visit?  no   Family History  Problem Relation Age of Onset  . COPD Mother   . Cancer Mother   . Dementia Mother   . Cancer Father   . CAD Father 79  . Cancer Brother   . Cirrhosis Brother    Social History   Socioeconomic History  . Marital status: Married    Spouse name: Not on file  . Number of children: 2  . Years of education: Not on file  . Highest education level: Not on file  Social Needs  . Financial resource strain: Not on file  . Food insecurity - worry: Not on file  . Food insecurity - inability: Not on file  . Transportation needs - medical: Not on file  . Transportation needs - non-medical: Not on file  Occupational History  . Occupation: DISABLED    Employer: UNEMPLOYED  Tobacco Use  . Smoking status: Current Every Day Smoker    Packs/day: 1.00    Years: 35.00    Pack years: 35.00    Types: Cigarettes  . Smokeless tobacco: Never Used  . Tobacco comment: trying to cut back  Substance and Sexual Activity  . Alcohol use: No  . Drug use: No  . Sexual activity: Not on file  Other Topics Concern  . Not on file  Social History Narrative   Lives at home with husband   Past Surgical History:  Procedure Laterality Date  . CERVICAL SPINE SURGERY     x 2  . FOOT SURGERY  2009   Past Medical History:  Diagnosis Date  . Arthritis   . COPD (chronic obstructive pulmonary disease) ([place])   . Dysthymia   . GERD (gastroesophageal reflux disease)   . IBS (irritable bowel syndrome)   . Osteoporosis   . Other chronic pain   . Smoker    There were no vitals taken for this visit.  Opioid Risk Score:  1 Fall Risk Score:  `1  Depression screen PHQ 2/9  Depression screen [facility] 2/9 09/21/2017 08/24/2017 02/08/2017 07/21/2016 06/23/2016  Decreased Interest 1 1 3  -  3  Down, Depressed, Hopeless 1 1 3  - 3  PHQ - 2 Score 2 2 6  - 6  Altered sleeping - - - 3 3  Tired, decreased energy - - - 3 3  Change in appetite - - - - 0  Feeling bad or failure about yourself  - - - - 2  Trouble concentrating - - - - 2  Moving slowly or fidgety/restless - - - - 2  Suicidal thoughts - - - - 0  PHQ-9 Score - - - - 18  Difficult doing work/chores - - - - Very difficult     Review of Systems  Constitutional: Positive for unexpected weight change.  HENT: Negative.   Eyes: Negative.   Respiratory: Positive for shortness of breath and wheezing.   Cardiovascular: Negative.   Gastrointestinal: Positive for constipation, nausea and vomiting.  Endocrine: Negative.   Genitourinary: Negative.   Musculoskeletal: Positive for arthralgias, back pain, gait problem, joint swelling, myalgias, neck pain and neck stiffness.       Spasms   Skin: Positive for rash.  Allergic/Immunologic: Negative.   Neurological: Positive for tremors, weakness and numbness.       Tingling   Hematological: Bruises/bleeds easily.  Psychiatric/Behavioral: Positive for dysphoric mood. The patient is nervous/anxious.   All other systems reviewed and are negative.      Objective:   Physical Exam  Constitutional: She is oriented to person, place, and time. She appears well-developed and well-nourished.  HENT:  Head: Normocephalic and atraumatic.  Neck: Normal range of motion. Neck supple.  Cervical Paraspinal Tenderness: C-5-C-6  Cardiovascular: Normal rate and regular rhythm.  Pulmonary/Chest: Effort normal and breath sounds normal.  Musculoskeletal:  Normal Muscle Bulk and Muscle Testing Reveals: Upper Extremities:Decreased ROM 90 Degrees and Muscle Strength on the Right  5/5 Left: 4/5 Bilateral AC Joint Tenderness: L>R Thoracic Hypersensitivity : T-1-T-7  Mainly Left Side Lumbar Hypersensitivity Lower Extremities: Full ROM and Muscle Strength 5/5 Arises from Table slowly using straight  cane for support Antalgic Gait  Neurological: She is alert and oriented to person, place, and time.  Skin: Skin is warm and dry.  Psychiatric: She has a normal mood and affect.  Nursing note and vitals reviewed.         Assessment & Plan:  1. 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 Patient s/p cervical spine surgery per Dr Patrice Paradise on Mar 09, 2012.  Continue Zonegran. 09/21/2017 2. Lumbar degenerative disc disease as well as facet arthropathy and chronic low back pain. Continue HEP. 09/21/2017 3. Lumbar Radiculopathy/ Neuropathic left upper extremity and left lower extremity pain: Continue Zonegran. 09/21/2017 4. Left Greater trochantericbursitis: No complaints today, Continue Ice Therapy and HEP. 09/21/2017 5. Chronic pain syndrome: 09/21/2017 Refilled oxycodone  15mg  one tablet QID as needed #120.  We will continue the opioid monitoring program, this consists of regular clinic visits, examinations, urine drug screen, pill counts as well as use of [place] Controlled Substance Reporting System.  6. Depression/anxiety-: Psychiatry Following: Continue Klonopin.09/21/2017 7. Muscle Spasm: Continue Flexeril. 09/21/2017. 8. Opioid Induced Constipation: Two boxes of Movantik samples given/ Educated on Movantik. Financial Hardship. Continue to Monitor. She has an appointment with Gastroenterologist next month she reports.    20 minutes of face to face patient care time was spent during this visit. All questions were encouraged and answered.   F/U in 1 month

## 2017-10-01 ENCOUNTER — Telehealth: Payer: Self-pay | Admitting: Family Medicine

## 2017-10-01 NOTE — Telephone Encounter (Unsigned)
Copied from [place] #[id number]. >> Oct 01, 2017  2:26 PM Neva Seat wrote: [facility] Cardiologist / Linzie Collin [phone]   Checking the status of notes requested for pt.

## 2017-10-05 ENCOUNTER — Encounter: Payer: Self-pay | Admitting: Cardiology

## 2017-10-05 ENCOUNTER — Ambulatory Visit: Payer: Medicare Other | Admitting: Cardiology

## 2017-10-05 VITALS — BP 100/64 | HR 100 | Ht 64.0 in | Wt 153.0 lb

## 2017-10-05 DIAGNOSIS — I2510 Atherosclerotic heart disease of native coronary artery without angina pectoris: Secondary | ICD-10-CM

## 2017-10-05 DIAGNOSIS — Z86711 Personal history of pulmonary embolism: Secondary | ICD-10-CM | POA: Diagnosis not present

## 2017-10-05 DIAGNOSIS — R0602 Shortness of breath: Secondary | ICD-10-CM

## 2017-10-05 DIAGNOSIS — R071 Chest pain on breathing: Secondary | ICD-10-CM | POA: Diagnosis not present

## 2017-10-05 NOTE — Progress Notes (Signed)
PCP: Kelton Pillar, MD  Clinic Note: Chief Complaint  Patient presents with  . New Patient (Initial Visit)    Coronary calcification on CT  . Shortness of Breath  . Chest Pain  . Edema    HPI: Allison Lee is a 61 y.o. female who is being seen today for the evaluation of Coronary artery calcification along with some chest discomfort and shortness of breath at the request of  Kelton Pillar, MD & Copland, Gay Filler, MD. H/o PE in 2015 -- 1 yr on Xarelto.   COPD -> Quit smoking 6 weeks ago.  Allison Lee was Recently seen in the emergency room on October 20 for COPD exacerbation.  She had a chest CT angiogram that showed coronary artery calcification/atherosclerosis and is now referred for cardiology evaluation.  Recent Hospitalizations:   08/21/2017: COPD, r/o PE - home on Abx.  Studies Personally Reviewed - (if available, images/films reviewed: From Epic Chart or Care Everywhere)  2D Echo 2015 (in setting of PE) - 60-65%.  Normal LV Size & function.  Mild LA dilation.  PAP ~50 mmHg, mod TR.  CTA chest 08/21/2017 --> no PE.  Emphysema.  Coronary Atherosclerosis.  Interval History: Allison Lee presents today for cardiology evaluation.  She notes that she really was not sure what the indication for come here was.  She knew that there is something wrong with her CT scan.  She tells me that she had some chest discomfort while she was having a COPD exacerbation, with coughing and difficulty taking deep breath.  Since then she has not had any further chest discomfort with rest or exertion besides having some GERD symptoms. This about 3 weeks ago she had a episode of significant lower extremity swelling with engorged lower extremities.  Apparently it turns out that she has been on Lasix for a while, had stopped her Lasix - Potentially because of hypokalemia.  She then was put back on Lasix temporarily but her PCP informed her that she probably should not be on Lasix long-term and  therefore wean her off.  No further edema. She is chronically short of breath because of COPD, but is slowly trended recover from her recent exacerbation.  No recurrent fevers or chills.    She has had a history of presumable ischemic colitis having had some blood in her stool and was evaluated and treated.  She is currently now on Movantik (a as a new Rx for colitis).  She does have exertional dyspnea, but has always had exertional dyspnea.  She sleeps on 4 pillows mostly because of back and neck pain.  They actually even have the head of the bed elevated with cinderblocks.  This is not because of orthopnea.  She denies any PND.  Her activity level is limited by left hip pain and off and on knee/calf pain.  A lot of this is done by her lower back pain on the left side.She denies any symptoms consistent with claudication.  No palpitations, lightheadedness, dizziness, weakness or syncope/near syncope. No TIA/amaurosis fugax symptoms.   ROS: A comprehensive was performed. Review of Systems  Constitutional: Negative for malaise/fatigue.  HENT: Negative for nosebleeds and sinus pain.   Respiratory: Positive for cough, shortness of breath and wheezing. Negative for sputum production.        Recovering from recent COPD exacerbation  Cardiovascular:       Per HPI  Gastrointestinal: Negative for blood in stool, constipation and melena.  Genitourinary: Negative for dysuria, frequency and  hematuria.  Musculoskeletal: Positive for joint pain (Left hip and leg pain). Negative for falls.  Neurological: Negative for dizziness and weakness.  Psychiatric/Behavioral: Negative for depression and memory loss. The patient does not have insomnia.     I have reviewed and (if needed) personally updated the patient's problem list, medications, allergies, past medical and surgical history, social and family history.   Past Medical History:  Diagnosis Date  . Arthritis   . COPD (chronic obstructive pulmonary  disease) ([place])   . Dysthymia   . GERD (gastroesophageal reflux disease)   . History of ischemic colitis   . History of pulmonary embolism 2015   1 year of Xarelto  . IBS (irritable bowel syndrome)   . Osteoporosis   . Other chronic pain   . Smoker     Past Surgical History:  Procedure Laterality Date  . CERVICAL SPINE SURGERY     x 2  . FOOT SURGERY  2009    Current Meds  Medication Sig  . Calcium-Vitamin D 500-100 MG-UNIT WAFR Take 1 each by mouth 2 (two) times daily.   . clonazePAM (KLONOPIN) 1 MG tablet Take 1 mg by mouth 2 (two) times daily.   . cyclobenzaprine (FLEXERIL) 10 MG tablet Take 1 tablet (10 mg total) by mouth 3 (three) times daily as needed for muscle spasms.  . DULoxetine (CYMBALTA) 30 MG capsule Take 30 mg by mouth daily.  Marland Kitchen omeprazole (PRILOSEC) 40 MG capsule Take 40 mg by mouth daily.   Marland Kitchen oxyCODONE (ROXICODONE) 15 MG immediate release tablet Take 1 tablet (15 mg total) by mouth 4 (four) times daily as needed.  . traZODone (DESYREL) 100 MG tablet Take 2 tablets (200 mg total) by mouth at bedtime.  Marland Kitchen zonisamide (ZONEGRAN) 100 MG capsule Take 2 capsules (200 mg total) by mouth at bedtime.    MOVANTIK  Allergies  Allergen Reactions  . Methadone Other (See Comments)    Fluid retention  . Hydrocodone Nausea Only  . Topamax Other (See Comments)    osteoporosis  . Hctz [Hydrochlorothiazide] Rash  . Neurontin [Gabapentin] Swelling  . Pregabalin Other (See Comments)    Dizziness    Social History   Socioeconomic History  . Marital status: Married    Spouse name: None  . Number of children: 2  . Years of education: 36  . Highest education level: High school graduate  Social Needs  . Financial resource strain: None  . Food insecurity - worry: None  . Food insecurity - inability: None  . Transportation needs - medical: None  . Transportation needs - non-medical: None  Occupational History  . Occupation: DISABLED    Employer: UNEMPLOYED  Tobacco  Use  . Smoking status: Former Smoker    Packs/day: 1.00    Years: 35.00    Pack years: 35.00    Types: Cigarettes    Last attempt to quit: 08/31/2017    Years since quitting: 0.0  . Smokeless tobacco: Never Used  . Tobacco comment: trying to cut back  Substance and Sexual Activity  . Alcohol use: No  . Drug use: No  . Sexual activity: None  Other Topics Concern  . None  Social History Narrative   Lives at home with husband.  They have 2 children.  She is currently disabled because of history of ischemic colitis and COPD.  She does band stretching exercises, but no real active walking type exercise.      She quit smoking on October 30 after getting  home from her COPD exacerbation.    family history includes CAD (age of onset: 50) in her father; COPD in her mother; Cancer in her brother, father, and mother; Cirrhosis in her brother; Dementia in her mother.  Wt Readings from Last 3 Encounters:  10/05/17 153 lb (69.4 kg)  02/25/17 168 lb 9.6 oz (76.5 kg)  11/26/16 171 lb 6.4 oz (77.7 kg)    PHYSICAL EXAM BP 100/64   Pulse 100   Ht 5\' 4"  (1.626 m)   Wt 153 lb (69.4 kg)   BMI 26.26 kg/m   Physical Exam  Constitutional: She is oriented to person, place, and time. She appears well-developed and well-nourished. No distress.  Well-groomed.  HENT:  Head: Normocephalic and atraumatic.  Eyes: Conjunctivae and EOM are normal. Pupils are equal, round, and reactive to light. No scleral icterus.  Neck: Normal range of motion. Neck supple. No hepatojugular reflux and no JVD present. Carotid bruit is not present. No tracheal deviation present. No thyromegaly present.  Cardiovascular: Regular rhythm, S1 normal, S2 normal and normal pulses.  No extrasystoles are present. Tachycardia present. PMI is not displaced. Exam reveals distant heart sounds (Diminished). Exam reveals no gallop, no friction rub and no decreased pulses.  No murmur heard. Pulmonary/Chest: Effort normal. No respiratory  distress. She has no wheezes. She has no rales.  Mild diffuse interstitial sounds with occasional crackles/rhonchi.  No obvious wheeze  Abdominal: Soft. Bowel sounds are normal. She exhibits no distension. There is no tenderness. There is no rebound.  Musculoskeletal: Normal range of motion. She exhibits no edema.  Lymphadenopathy:    She has cervical adenopathy ([place]).  Neurological: She is alert and oriented to person, place, and time. No cranial nerve deficit.  Skin: Skin is warm and dry.  Psychiatric: She has a normal mood and affect. Her behavior is normal. Judgment and thought content normal.  Nursing note and vitals reviewed.    Adult ECG Report  Rate: 100 ;  Rhythm: sinus tachycardia; otherwise normal axis, intervals and durations  Narrative Interpretation: Essentially normal EKG   Other studies Reviewed: Additional studies/ records that were reviewed today include:  Recent Labs:   Lab Results  Component Value Date   CREATININE 0.86 08/21/2017   BUN 11 08/21/2017   NA 137 08/21/2017   K 3.1 (L) 08/21/2017   CL 98 (L) 08/21/2017   CO2 28 08/21/2017   Lab Results  Component Value Date   CHOL 155 02/25/2017   HDL 40.00 02/25/2017   LDLCALC 80 02/25/2017   TRIG 176.0 (H) 02/25/2017   CHOLHDL 4 02/25/2017   Lab Results  Component Value Date   WBC 9.7 08/21/2017   HGB 14.7 08/21/2017   HCT 43.3 08/21/2017   MCV 92.7 08/21/2017   PLT 269 08/21/2017    ASSESSMENT / PLAN: Problem List Items Addressed This Visit    Chest pain    The chest pain she described was during her COPD exacerbation mostly associated with coughing and deep inspiration.  More likely musculoskeletal pain.  We will check coronary calcium score however to stratify her CAD risk.  If this is abnormal, would proceed with coronary CTA/CT FFR to exclude obstructive multivessel disease.      Relevant Orders   EKG 12-Lead (Completed)   CT CARDIAC SCORING   ECHOCARDIOGRAM COMPLETE   Coronary  artery calcification seen on CAT scan - Primary    She is a 70 year old smoker who does not have hypertension or dyslipidemia.  No diabetes.  However she had a CT scan that showed LAD calcification. Plan: We will begin with cardiac calcium score to quantify the extent of coronary artery calcification.  Would like to then proceed with coronary CT angiography.      Relevant Orders   CT CARDIAC SCORING   ECHOCARDIOGRAM COMPLETE   History of pulmonary embolism (Chronic)    She has COPD and has a history of having a pulmonary embolus.  We will check a 2D echocardiogram to determine if there is any evidence of cor pulmonale.      Shortness of breath    Probably related to COPD, however she does have some symptoms that would suggest possible PND and orthopnea although it sounds more musculoskeletal.  Because she also has edema, we will check a 2D echocardiogram to exclude cardiomyopathy or reduced EF/diastolic dysfunction.      Relevant Orders   EKG 12-Lead (Completed)   CT CARDIAC SCORING   ECHOCARDIOGRAM COMPLETE      Current medicines are reviewed at length with the patient today. (+/- concerns) none The following changes have been made: none  Patient Instructions  Your physician recommends that you schedule a follow-up appointment in [place].  NO CHANGE WITH CURRENT MEDICATONS     SCHEDULE AT [place] Your physician has requested that you have an echocardiogram. Echocardiography is a painless test that uses sound waves to create images of your heart. It provides your doctor with information about the size and shape of your heart and how well your heart's chambers and valves are working. This procedure takes approximately one hour. There are no restrictions for this procedure. AND  DR Vandy Tsuchiya.has ordered a CT coronary calcium score. This test is done at [address].   Coronary CalciumScan A coronary calcium scan is  an imaging test used to look for deposits of calcium and other fatty materials (plaques) in the inner lining of the blood vessels of the heart (coronary arteries). These deposits of calcium and plaques can partly clog and narrow the coronary arteries without producing any symptoms or warning signs. This puts a person at risk for a heart attack. This test can detect these deposits before symptoms develop.THIS TEST IS COST $150 , THIS IS NOT COVERED BY INSURANCE.  Tell a health care provider about:  Any allergies you have.  All medicines you are taking, including vitamins, herbs, eye drops, creams, and over-the-counter medicines.  Any problems you or family members have had with anesthetic medicines.  Any blood disorders you have.  Any surgeries you have had.  Any medical conditions you have.  Whether you are pregnant or may be pregnant. What are the risks? Generally, this is a safe procedure. However, problems may occur, including:  Harm to a pregnant woman and her unborn baby. This test involves the use of radiation. Radiation exposure can be dangerous to a pregnant woman and her unborn baby. If you are pregnant, you generally should not have this procedure done.  Slight increase in the risk of cancer. This is because of the radiation involved in the test. What happens before the procedure? No preparation is needed for this procedure. What happens during the procedure?  You will undress and remove any jewelry around your neck or chest.  You will put on a hospital gown.  Sticky electrodes will be placed on your chest. The electrodes will be connected to an electrocardiogram (ECG) machine to record a tracing  of the electrical activity of your heart.  A CT scanner will take pictures of your heart. During this time, you will be asked to lie still and hold your breath for 2-3 seconds while a picture of your heart is being taken. The procedure may vary among health care providers and  hospitals. What happens after the procedure?  You can get dressed.  You can return to your normal activities.  It is up to you to get the results of your test. Ask your health care provider, or the department that is doing the test, when your results will be ready. Summary  A coronary calcium scan is an imaging test used to look for deposits of calcium and other fatty materials (plaques) in the inner lining of the blood vessels of the heart (coronary arteries).  Generally, this is a safe procedure. Tell your health care provider if you are pregnant or may be pregnant.  No preparation is needed for this procedure.  A CT scanner will take pictures of your heart.  You can return to your normal activities after the scan is done. This information is not intended to replace advice given to you by your health care provider. Make sure you discuss any questions you have with your health care provider. Document Released: 04/16/2008 Document Revised: 09/07/2016 Document Reviewed: 09/07/2016 Elsevier Interactive Patient Education  2017 [employer].       Studies Ordered:   Orders Placed This Encounter  Procedures  . CT CARDIAC SCORING  . EKG 12-Lead  . ECHOCARDIOGRAM COMPLETE       Glenetta Hew, M.D., M.S. Interventional Cardiologist   Pager # [phone] Phone # [phone] [address]. [place], [place] [address]

## 2017-10-05 NOTE — Patient Instructions (Addendum)
Your physician recommends that you schedule a follow-up appointment in [place].  NO CHANGE WITH CURRENT MEDICATONS     SCHEDULE AT [place] Your physician has requested that you have an echocardiogram. Echocardiography is a painless test that uses sound waves to create images of your heart. It provides your doctor with information about the size and shape of your heart and how well your heart's chambers and valves are working. This procedure takes approximately one hour. There are no restrictions for this procedure. AND  DR HARDING.has ordered a CT coronary calcium score. This test is done at [address].   Coronary CalciumScan A coronary calcium scan is an imaging test used to look for deposits of calcium and other fatty materials (plaques) in the inner lining of the blood vessels of the heart (coronary arteries). These deposits of calcium and plaques can partly clog and narrow the coronary arteries without producing any symptoms or warning signs. This puts a person at risk for a heart attack. This test can detect these deposits before symptoms develop.THIS TEST IS COST $150 , THIS IS NOT COVERED BY INSURANCE.  Tell a health care provider about:  Any allergies you have.  All medicines you are taking, including vitamins, herbs, eye drops, creams, and over-the-counter medicines.  Any problems you or family members have had with anesthetic medicines.  Any blood disorders you have.  Any surgeries you have had.  Any medical conditions you have.  Whether you are pregnant or may be pregnant. What are the risks? Generally, this is a safe procedure. However, problems may occur, including:  Harm to a pregnant woman and her unborn baby. This test involves the use of radiation. Radiation exposure can be dangerous to a pregnant woman and her unborn baby. If you are pregnant, you generally should not have this procedure  done.  Slight increase in the risk of cancer. This is because of the radiation involved in the test. What happens before the procedure? No preparation is needed for this procedure. What happens during the procedure?  You will undress and remove any jewelry around your neck or chest.  You will put on a hospital gown.  Sticky electrodes will be placed on your chest. The electrodes will be connected to an electrocardiogram (ECG) machine to record a tracing of the electrical activity of your heart.  A CT scanner will take pictures of your heart. During this time, you will be asked to lie still and hold your breath for 2-3 seconds while a picture of your heart is being taken. The procedure may vary among health care providers and hospitals. What happens after the procedure?  You can get dressed.  You can return to your normal activities.  It is up to you to get the results of your test. Ask your health care provider, or the department that is doing the test, when your results will be ready. Summary  A coronary calcium scan is an imaging test used to look for deposits of calcium and other fatty materials (plaques) in the inner lining of the blood vessels of the heart (coronary arteries).  Generally, this is a safe procedure. Tell your health care provider if you are pregnant or may be pregnant.  No preparation is needed for this procedure.  A CT scanner will take pictures of your heart.  You can return to your normal activities after the scan is done. This information is not  intended to replace advice given to you by your health care provider. Make sure you discuss any questions you have with your health care provider. Document Released: 04/16/2008 Document Revised: 09/07/2016 Document Reviewed: 09/07/2016 Elsevier Interactive Patient Education  2017 [employer].

## 2017-10-06 ENCOUNTER — Encounter: Payer: Self-pay | Admitting: Cardiology

## 2017-10-06 NOTE — Assessment & Plan Note (Signed)
Probably related to COPD, however she does have some symptoms that would suggest possible PND and orthopnea although it sounds more musculoskeletal.  Because she also has edema, we will check a 2D echocardiogram to exclude cardiomyopathy or reduced EF/diastolic dysfunction.

## 2017-10-06 NOTE — Assessment & Plan Note (Signed)
The chest pain she described was during her COPD exacerbation mostly associated with coughing and deep inspiration.  More likely musculoskeletal pain.  We will check coronary calcium score however to stratify her CAD risk.  If this is abnormal, would proceed with coronary CTA/CT FFR to exclude obstructive multivessel disease.

## 2017-10-06 NOTE — Assessment & Plan Note (Signed)
She has COPD and has a history of having a pulmonary embolus.  We will check a 2D echocardiogram to determine if there is any evidence of cor pulmonale.

## 2017-10-06 NOTE — Assessment & Plan Note (Signed)
She is a 61 year old smoker who does not have hypertension or dyslipidemia.  No diabetes.  However she had a CT scan that showed LAD calcification. Plan: We will begin with cardiac calcium score to quantify the extent of coronary artery calcification.  Would like to then proceed with coronary CT angiography.

## 2017-10-12 ENCOUNTER — Inpatient Hospital Stay: Admission: RE | Admit: 2017-10-12 | Payer: Medicare Other | Source: Ambulatory Visit

## 2017-10-12 ENCOUNTER — Other Ambulatory Visit (HOSPITAL_COMMUNITY): Payer: Medicare Other

## 2017-10-16 IMAGING — CR DG SHOULDER 2+V*L*
3 series · 3 of 3 positions shown · non-contrast
Comparison: None.

CLINICAL DATA: C/o increased LEFT shoulder pain APPROX. 6 months /
no trauma / pain radiates to left side of neck and numbness in LEFT
hand

EXAM:
LEFT SHOULDER - 2+ VIEW

[w shoulder grashey left]
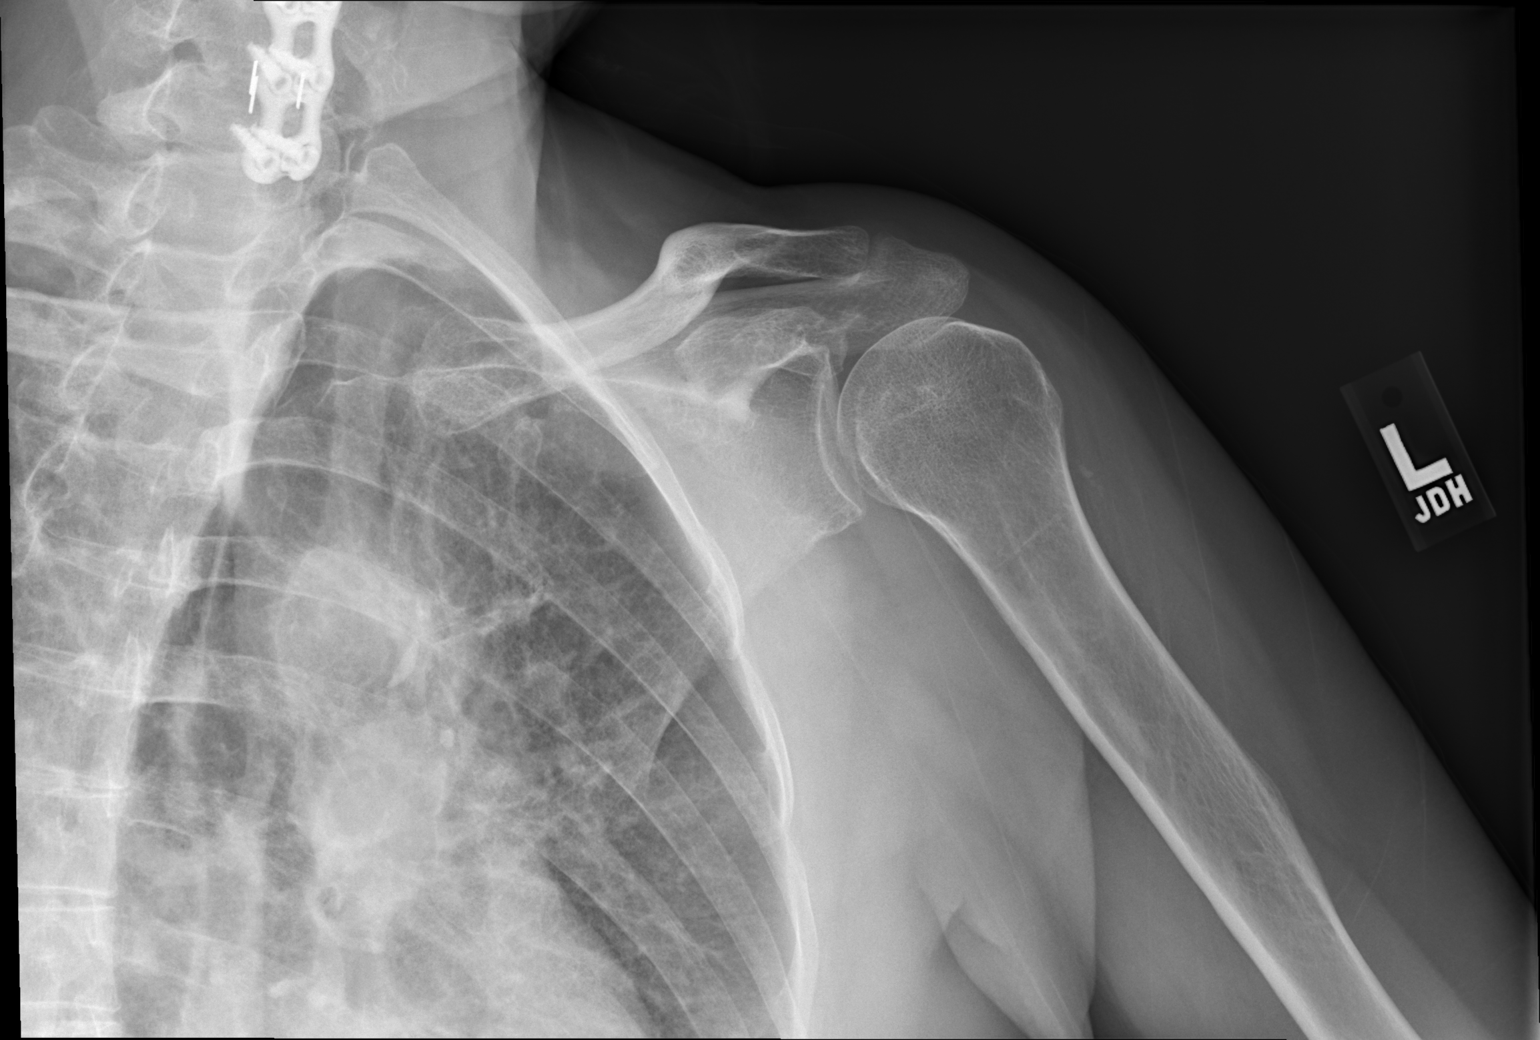

[w shoulder y-view left]
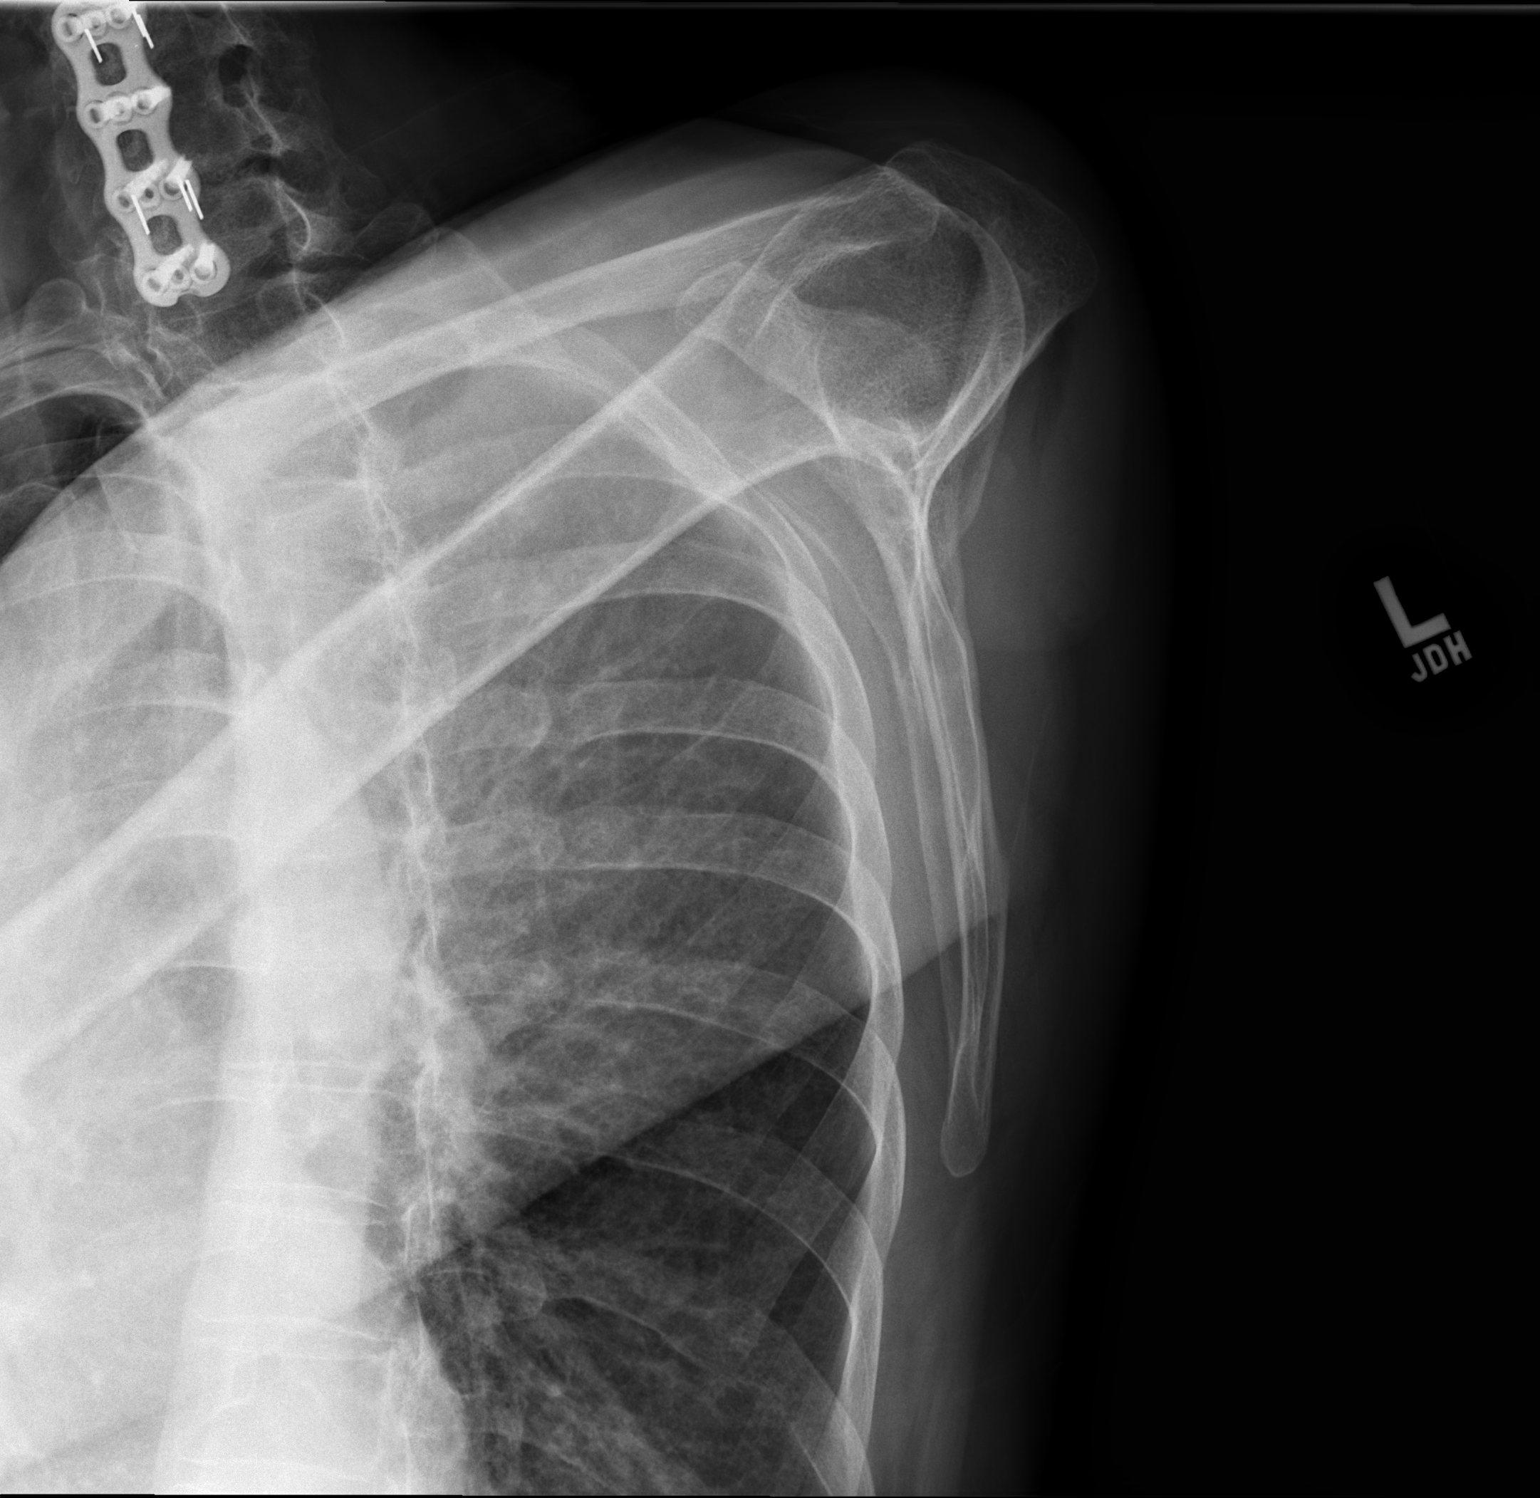

[w shoulder axillary left]
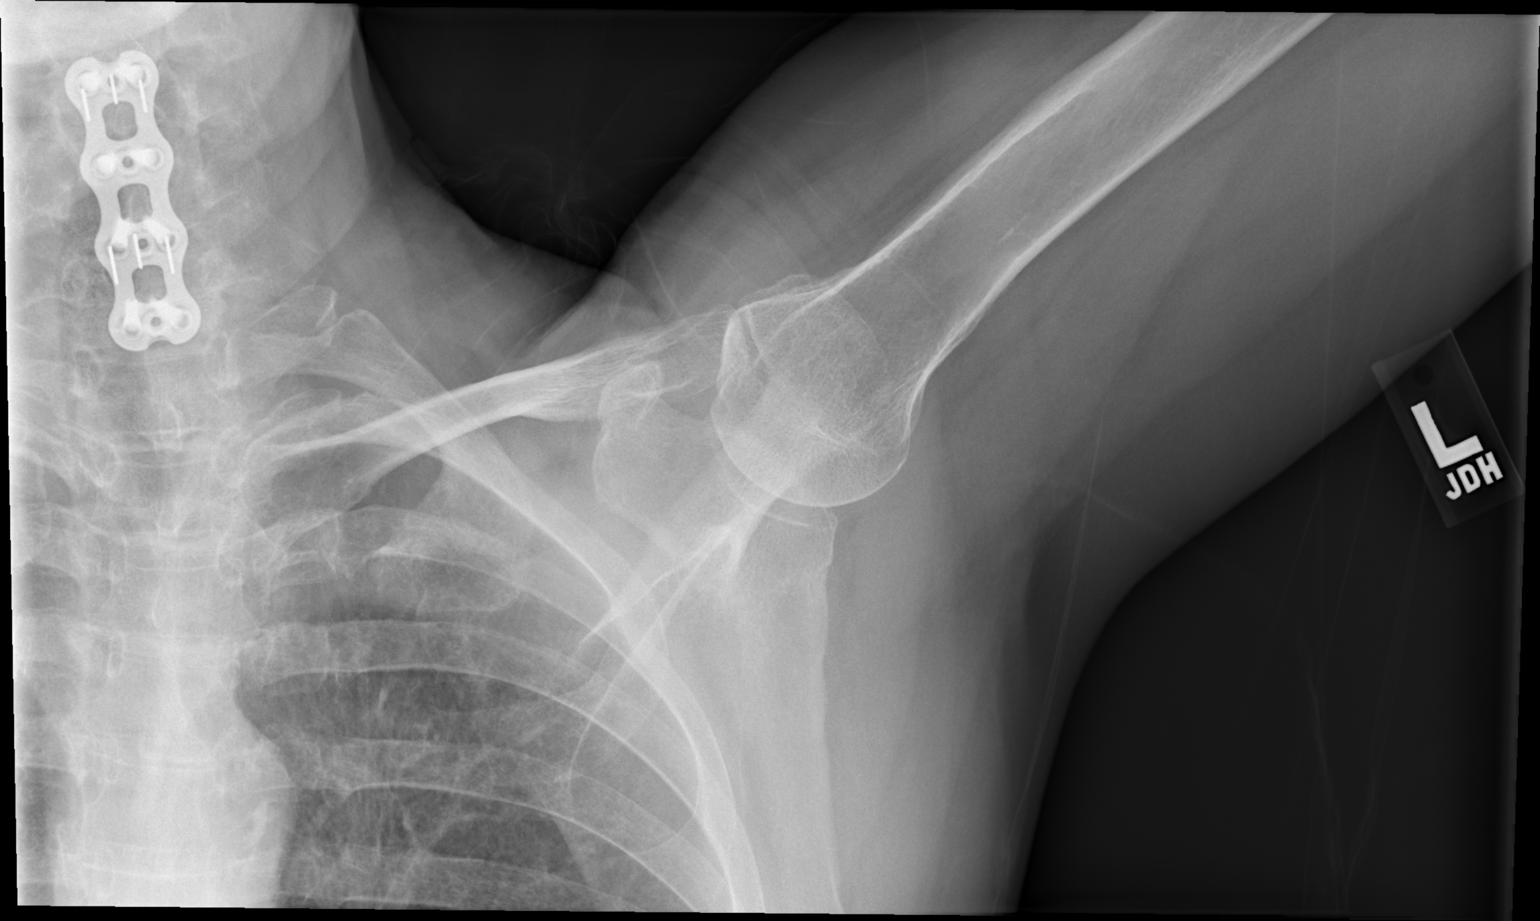

[3 of 3 positions shown; findings below may reference images not displayed]

FINDINGS: No fracture.  No bone lesion.

The glenohumeral and AC joints are normally spaced and aligned. No
degenerative/arthropathic change.

Soft tissues are unremarkable.
IMPRESSION: Negative.

## 2017-10-19 ENCOUNTER — Encounter: Payer: Self-pay | Admitting: Registered Nurse

## 2017-10-19 ENCOUNTER — Ambulatory Visit: Payer: Medicare Other | Admitting: Registered Nurse

## 2017-10-19 ENCOUNTER — Encounter: Payer: Medicare Other | Attending: Physical Medicine & Rehabilitation | Admitting: Registered Nurse

## 2017-10-19 VITALS — BP 113/79 | HR 105

## 2017-10-19 DIAGNOSIS — M961 Postlaminectomy syndrome, not elsewhere classified: Secondary | ICD-10-CM

## 2017-10-19 DIAGNOSIS — G894 Chronic pain syndrome: Secondary | ICD-10-CM | POA: Diagnosis not present

## 2017-10-19 DIAGNOSIS — G8929 Other chronic pain: Secondary | ICD-10-CM | POA: Insufficient documentation

## 2017-10-19 DIAGNOSIS — Z5181 Encounter for therapeutic drug level monitoring: Secondary | ICD-10-CM

## 2017-10-19 DIAGNOSIS — M5416 Radiculopathy, lumbar region: Secondary | ICD-10-CM | POA: Diagnosis not present

## 2017-10-19 DIAGNOSIS — Z79899 Other long term (current) drug therapy: Secondary | ICD-10-CM | POA: Diagnosis not present

## 2017-10-19 DIAGNOSIS — M542 Cervicalgia: Secondary | ICD-10-CM

## 2017-10-19 DIAGNOSIS — Z9889 Other specified postprocedural states: Secondary | ICD-10-CM | POA: Insufficient documentation

## 2017-10-19 DIAGNOSIS — M7062 Trochanteric bursitis, left hip: Secondary | ICD-10-CM | POA: Diagnosis not present

## 2017-10-19 DIAGNOSIS — M47816 Spondylosis without myelopathy or radiculopathy, lumbar region: Secondary | ICD-10-CM | POA: Diagnosis not present

## 2017-10-19 DIAGNOSIS — M4302 Spondylolysis, cervical region: Secondary | ICD-10-CM | POA: Diagnosis present

## 2017-10-19 MED ORDER — OXYCODONE HCL 15 MG PO TABS: 15.0000 mg | ORAL_TABLET | Freq: Four times a day (QID) | ORAL | 0 refills | Status: DC | PRN

## 2017-10-19 NOTE — Progress Notes (Signed)
Subjective:    Patient ID: Allison Lee, female    DOB: 08-08-1956, 61 y.o.   MRN: [id number]  HPI: Allison Lee is a 61year old female who returns for follow up appointment and medication refill. She states her pain is located in her neck radiating into her left shoulder and left arm with tingling and numbness.Also reports lower back painradiating in her leftlower extremity and left hip pain. She rates her pain 7. Her current exercise regime is performing stretching exercises with bands and walking.   Allison Lee has stopped smoking for two months, encouraged to continue with smoking cessation.   Allison Lee equivalent is  90.00 MME.She is also prescribed Klonopin by [name]. We have reviewed the black box warning again in regards of  using opioids and benzodiazepines.I highlighted the dangers of using these drugs together and discussed the adverse events including respiratory suppression, overdose, cognitive impairment and importance of  compliance with current regimen. She verbalizes understanding, we will continue to monitor and adjust as indicated.  She is being closely monitored and under the care of her psychiatrist.  Allison Lee Oral Swab was performed on 06/04/2017 it was consistent.  Pain Inventory Average Pain 5 Pain Right Now 7 My pain is constant, sharp, burning, dull, stabbing, tingling and aching  In the last 24 hours, has pain interfered with the following? General activity 9 Relation with others 9 Enjoyment of life 10 What TIME of day is your pain at its worst? all Sleep (in general) Poor  Pain is worse with: walking, bending, sitting, standing and some activites Pain improves with: rest and medication Relief from Meds: 6  Mobility walk with assistance use a cane how many minutes can you walk? 3-5 ability to climb steps?  yes do you drive?  yes transfers alone  Function disabled: date disabled 2005 I need assistance with the  following:  dressing, household duties and shopping Do you have any goals in this area?  no  Neuro/Psych weakness numbness tingling trouble walking spasms depression anxiety  Prior Studies Any changes since last visit?  no  Physicians involved in your care Any changes since last visit?  no   Family History  Problem Relation Age of Onset  . COPD Mother   . Cancer Mother   . Dementia Mother   . Cancer Father   . CAD Father 55  . Cancer Brother   . Cirrhosis Brother    Social History   Socioeconomic History  . Marital status: Married    Spouse name: Not on file  . Number of children: 2  . Years of education: 47  . Highest education level: High school graduate  Social Needs  . Financial resource strain: Not on file  . Food insecurity - worry: Not on file  . Food insecurity - inability: Not on file  . Transportation needs - medical: Not on file  . Transportation needs - non-medical: Not on file  Occupational History  . Occupation: DISABLED    Employer: UNEMPLOYED  Tobacco Use  . Smoking status: Former Smoker    Packs/day: 1.00    Years: 35.00    Pack years: 35.00    Types: Cigarettes    Last attempt to quit: 08/31/2017    Years since quitting: 0.1  . Smokeless tobacco: Never Used  . Tobacco comment: trying to cut back  Substance and Sexual Activity  . Alcohol use: No  . Drug use: No  . Sexual activity: Not on  file  Other Topics Concern  . Not on file  Social History Narrative   Lives at home with husband.  They have 2 children.  She is currently disabled because of history of ischemic colitis and COPD.  She does band stretching exercises, but no real active walking type exercise.      She quit smoking on October 30 after getting home from her COPD exacerbation.   Past Surgical History:  Procedure Laterality Date  . CERVICAL SPINE SURGERY     x 2  . FOOT SURGERY  2009   Past Medical History:  Diagnosis Date  . Arthritis   . COPD (chronic  obstructive pulmonary disease) ([place])   . Dysthymia   . GERD (gastroesophageal reflux disease)   . History of ischemic colitis   . History of pulmonary embolism 2015   1 year of Xarelto  . IBS (irritable bowel syndrome)   . Osteoporosis   . Other chronic pain   . Smoker    There were no vitals taken for this visit.  Opioid Risk Score:  1 Fall Risk Score:  `1  Depression screen PHQ 2/9  Depression screen [facility] 2/9 09/21/2017 08/24/2017 02/08/2017 07/21/2016 06/23/2016  Decreased Interest 1 1 3  - 3  Down, Depressed, Hopeless 1 1 3  - 3  PHQ - 2 Score 2 2 6  - 6  Altered sleeping - - - 3 3  Tired, decreased energy - - - 3 3  Change in appetite - - - - 0  Feeling bad or failure about yourself  - - - - 2  Trouble concentrating - - - - 2  Moving slowly or fidgety/restless - - - - 2  Suicidal thoughts - - - - 0  PHQ-9 Score - - - - 18  Difficult doing work/chores - - - - Very difficult     Review of Systems  Constitutional: Positive for unexpected weight change.  HENT: Negative.   Eyes: Negative.   Respiratory: Positive for shortness of breath and wheezing.   Cardiovascular: Negative.   Gastrointestinal: Positive for constipation.  Endocrine: Negative.   Genitourinary: Negative.   Musculoskeletal: Positive for arthralgias, back pain, gait problem, joint swelling, myalgias, neck pain and neck stiffness.       Spasms   Skin: Positive for rash.  Allergic/Immunologic: Negative.   Neurological: Positive for tremors, weakness and numbness.       Tingling   Hematological: Bruises/bleeds easily.  Psychiatric/Behavioral: Positive for dysphoric mood. The patient is nervous/anxious.   All other systems reviewed and are negative.      Objective:   Physical Exam  Constitutional: She is oriented to person, place, and time. She appears well-developed and well-nourished.  HENT:  Head: Normocephalic and atraumatic.  Neck: Normal range of motion. Neck supple.  Cervical Paraspinal  Tenderness: C-5-C-6  Cardiovascular: Normal rate and regular rhythm.  Pulmonary/Chest: Effort normal and breath sounds normal.  Musculoskeletal:  Normal Muscle Bulk and Muscle Testing Reveals: Upper Extremities:Decreased ROM 90 Degrees and Muscle Strength on the Right  5/5 Left: 4/5 Left AC Joint Tenderness Thoracic Hypersensitivity : T-1-T-7  Mainly Left Side Lumbar Hypersensitivity Left Greater Trochanter Tenderness Lower Extremities: Full ROM and Muscle Strength 5/5 Arises from Table slowly using straight cane for support Antalgic Gait  Neurological: She is alert and oriented to person, place, and time.  Skin: Skin is warm and dry.  Psychiatric: She has a normal mood and affect.  Nursing note and vitals reviewed.  Assessment & Plan:  1. 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 Patient s/p cervical spine surgery per Dr Patrice Paradise on Mar 09, 2012.  Continue Zonegran. 10/19/2017 2. Lumbar degenerative disc disease as well as facet arthropathy and chronic low back pain. Continue HEP. 10/19/2017 3. Lumbar Radiculopathy/ Neuropathic left upper extremity and left lower extremity pain: Continue Zonegran. 10/19/2017 4. Left Greater trochantericbursitis: Continue Ice Therapy and HEP. 10/19/2017 5. Chronic pain syndrome: 10/19/2017 Refilled oxycodone 15mg  one tablet QID as needed #120.  We will continue the opioid monitoring program, this consists of regular clinic visits, examinations, urine drug screen, pill counts as well as use of [place] Controlled Substance Reporting System.  6. Depression/anxiety-: Psychiatry Following: Continue Klonopin.10/19/2017 7. Muscle Spasm: Continue Flexeril. 10/19/2017. 8. 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 All questions were encouraged and answered.   F/U in 1 month

## 2017-10-29 ENCOUNTER — Telehealth: Payer: Self-pay | Admitting: Cardiology

## 2017-10-29 ENCOUNTER — Other Ambulatory Visit: Payer: Self-pay

## 2017-10-29 ENCOUNTER — Ambulatory Visit (HOSPITAL_COMMUNITY): Payer: Medicare Other | Attending: Internal Medicine

## 2017-10-29 ENCOUNTER — Ambulatory Visit (INDEPENDENT_AMBULATORY_CARE_PROVIDER_SITE_OTHER)
Admission: RE | Admit: 2017-10-29 | Discharge: 2017-10-29 | Disposition: A | Discharge status: Home / Self Care | Payer: Self-pay | Source: Ambulatory Visit | Attending: Cardiology | Admitting: Cardiology

## 2017-10-29 DIAGNOSIS — I2510 Atherosclerotic heart disease of native coronary artery without angina pectoris: Secondary | ICD-10-CM | POA: Insufficient documentation

## 2017-10-29 DIAGNOSIS — R0602 Shortness of breath: Secondary | ICD-10-CM | POA: Diagnosis present

## 2017-10-29 DIAGNOSIS — R071 Chest pain on breathing: Secondary | ICD-10-CM | POA: Insufficient documentation

## 2017-10-29 DIAGNOSIS — I071 Rheumatic tricuspid insufficiency: Secondary | ICD-10-CM | POA: Diagnosis not present

## 2017-10-29 NOTE — Telephone Encounter (Signed)
F/u message ° °Pt returning RN call  °

## 2017-10-29 NOTE — Telephone Encounter (Signed)
Patient made aware results and verbalized her understanding.  Notes recorded by Leonie Man, MD on 10/29/2017 at 3:46 PM EST Stable echocardiogram from last evaluation. Ejection fraction is normal at 60-65%. No significant diastolic dysfunction There is aortic sclerosis but not stenosis (no narrowing). Suggestion of mildly elevated pulmonary artery pressures estimated by tricuspid regurgitation. However IVC is not dilated which would argue against significant volume overload..   Notes recorded by Leonie Man, MD on 10/29/2017 at 3:47 PM EST Relatively low risk coronary calcium score 43. Would argue against significant coronary artery disease causing symptoms.  Based on this result, I would not consider following up with an additional test.  Glenetta Hew, MD

## 2017-11-16 ENCOUNTER — Encounter: Payer: Self-pay | Admitting: Registered Nurse

## 2017-11-16 ENCOUNTER — Other Ambulatory Visit: Payer: Self-pay

## 2017-11-16 ENCOUNTER — Encounter: Payer: Medicare Other | Attending: Physical Medicine & Rehabilitation | Admitting: Registered Nurse

## 2017-11-16 VITALS — BP 102/71 | HR 109

## 2017-11-16 DIAGNOSIS — Z9889 Other specified postprocedural states: Secondary | ICD-10-CM | POA: Diagnosis not present

## 2017-11-16 DIAGNOSIS — Z5181 Encounter for therapeutic drug level monitoring: Secondary | ICD-10-CM | POA: Diagnosis not present

## 2017-11-16 DIAGNOSIS — M4302 Spondylolysis, cervical region: Secondary | ICD-10-CM | POA: Insufficient documentation

## 2017-11-16 DIAGNOSIS — M7062 Trochanteric bursitis, left hip: Secondary | ICD-10-CM

## 2017-11-16 DIAGNOSIS — M62838 Other muscle spasm: Secondary | ICD-10-CM

## 2017-11-16 DIAGNOSIS — R296 Repeated falls: Secondary | ICD-10-CM

## 2017-11-16 DIAGNOSIS — M5412 Radiculopathy, cervical region: Secondary | ICD-10-CM

## 2017-11-16 DIAGNOSIS — G894 Chronic pain syndrome: Secondary | ICD-10-CM

## 2017-11-16 DIAGNOSIS — M542 Cervicalgia: Secondary | ICD-10-CM

## 2017-11-16 DIAGNOSIS — M5416 Radiculopathy, lumbar region: Secondary | ICD-10-CM | POA: Diagnosis not present

## 2017-11-16 DIAGNOSIS — M961 Postlaminectomy syndrome, not elsewhere classified: Secondary | ICD-10-CM | POA: Diagnosis not present

## 2017-11-16 DIAGNOSIS — G8929 Other chronic pain: Secondary | ICD-10-CM | POA: Insufficient documentation

## 2017-11-16 DIAGNOSIS — Z79899 Other long term (current) drug therapy: Secondary | ICD-10-CM | POA: Diagnosis not present

## 2017-11-16 DIAGNOSIS — M47816 Spondylosis without myelopathy or radiculopathy, lumbar region: Secondary | ICD-10-CM

## 2017-11-16 MED ORDER — OXYCODONE HCL 15 MG PO TABS: 15.0000 mg | ORAL_TABLET | Freq: Four times a day (QID) | ORAL | 0 refills | Status: DC | PRN

## 2017-11-16 NOTE — Patient Instructions (Signed)
Decrease Zonegran to one capsule ( 100 mg )  at HS:  Call office in a week to evaluate

## 2017-11-16 NOTE — Progress Notes (Signed)
Subjective:    Patient ID: Allison Lee, female    DOB: 08/25/1956, 62 y.o.   MRN: [id number]  HPI: [name] is a 62year old female who returns for follow up appointment and medication refill. She states her pain is located in her neck radiating into her left shoulder with tingling and numbness. Also reports lower back pain radiating into her left buttock and left lower extremity. She rates her pain 9. Her current exercise regime is walking.  [name] states she has fallen twice in the last month, on 11/12/2017 she was walking in her kitchen and fell backwards and landed on her buttocks, she was able to pick herself up. Also states on 11/13/2017 she was walking in her kitchen and left leg gave out and landed on her buttocks. On both occassions she didn't have her cane with her, she was educated on falls prevention and instructed to use her cane at all times. [name] was instructed to reduced Zonegran to 100 mg at HS she verbalizes understanding.  Referral was placed to neurology, she verbalizes understanding.   [name] has stopped smoking  encouraged to continue with smoking cessation.   [name] Morphine equivalent is  99.00 MME.She is also prescribed Klonopin by [name]. We have reviewed the black box warning again regarding using opioids and benzodiazepines.I highlighted the dangers of using these drugs together and discussed the adverse events including respiratory suppression, overdose, cognitive impairment and importance of  compliance with current regimen. She verbalizes understanding, we will continue to monitor and adjust as indicated.  She is being closely monitored and under the care of her psychiatrist.  [name] Oral Swab was performed on 06/04/2017 it was consistent. Oral Swab was Performed Today.  Pain Inventory Average Pain 9 Pain Right Now 9 My pain is constant, sharp, burning, dull, stabbing, tingling and aching  In the last 24 hours, has pain  interfered with the following? General activity 9 Relation with others 9 Enjoyment of life 10 What TIME of day is your pain at its worst? all Sleep (in general) Poor  Pain is worse with: walking, bending, sitting, inactivity and standing Pain improves with: rest and medication Relief from Meds: 5  Mobility walk with assistance use a cane how many minutes can you walk? 5 ability to climb steps?  yes do you drive?  yes transfers alone  Function disabled: date disabled 2005 I need assistance with the following:  dressing, bathing, meal prep, household duties and shopping Do you have any goals in this area?  no  Neuro/Psych weakness numbness tingling trouble walking spasms depression anxiety  Prior Studies Any changes since last visit?  no  Physicians involved in your care Any changes since last visit?  no   Family History  Problem Relation Age of Onset  . COPD Mother   . Cancer Mother   . Dementia Mother   . Cancer Father   . CAD Father 36  . Cancer Brother   . Cirrhosis Brother    Social History   Socioeconomic History  . Marital status: Married    Spouse name: None  . Number of children: 2  . Years of education: 80  . Highest education level: High school graduate  Social Needs  . Financial resource strain: None  . Food insecurity - worry: None  . Food insecurity - inability: None  . Transportation needs - medical: None  . Transportation needs - non-medical: None  Occupational History  .  Occupation: DISABLED    Employer: UNEMPLOYED  Tobacco Use  . Smoking status: Former Smoker    Packs/day: 1.00    Years: 35.00    Pack years: 35.00    Types: Cigarettes    Last attempt to quit: 08/31/2017    Years since quitting: 0.2  . Smokeless tobacco: Never Used  . Tobacco comment: trying to cut back  Substance and Sexual Activity  . Alcohol use: No  . Drug use: No  . Sexual activity: None  Other Topics Concern  . None  Social History Narrative    Lives at home with husband.  They have 2 children.  She is currently disabled because of history of ischemic colitis and COPD.  She does band stretching exercises, but no real active walking type exercise.      She quit smoking on October 30 after getting home from her COPD exacerbation.   Past Surgical History:  Procedure Laterality Date  . CERVICAL SPINE SURGERY     x 2  . FOOT SURGERY  2009   Past Medical History:  Diagnosis Date  . Arthritis   . COPD (chronic obstructive pulmonary disease) ([place])   . Dysthymia   . GERD (gastroesophageal reflux disease)   . History of ischemic colitis   . History of pulmonary embolism 2015   1 year of Xarelto  . IBS (irritable bowel syndrome)   . Osteoporosis   . Other chronic pain   . Smoker    There were no vitals taken for this visit.  Opioid Risk Score:  1 Fall Risk Score:  `1  Depression screen PHQ 2/9  Depression screen [facility] 2/9 11/16/2017 09/21/2017 08/24/2017 02/08/2017 07/21/2016 06/23/2016  Decreased Interest 1 1 1 3  - 3  Down, Depressed, Hopeless 1 1 1 3  - 3  PHQ - 2 Score 2 2 2 6  - 6  Altered sleeping - - - - 3 3  Tired, decreased energy - - - - 3 3  Change in appetite - - - - - 0  Feeling bad or failure about yourself  - - - - - 2  Trouble concentrating - - - - - 2  Moving slowly or fidgety/restless - - - - - 2  Suicidal thoughts - - - - - 0  PHQ-9 Score - - - - - 18  Difficult doing work/chores - - - - - Very difficult     Review of Systems  Constitutional: Positive for unexpected weight change.  HENT: Negative.   Eyes: Negative.   Respiratory: Positive for shortness of breath and wheezing.   Cardiovascular: Negative.   Gastrointestinal: Positive for constipation.  Endocrine: Negative.   Genitourinary: Negative.   Musculoskeletal: Positive for arthralgias, back pain, gait problem, joint swelling, myalgias, neck pain and neck stiffness.       Spasms   Skin: Positive for rash.  Allergic/Immunologic: Negative.     Neurological: Positive for tremors, weakness and numbness.       Tingling   Hematological: Bruises/bleeds easily.  Psychiatric/Behavioral: Positive for dysphoric mood. The patient is nervous/anxious.   All other systems reviewed and are negative.      Objective:   Physical Exam  Constitutional: She is oriented to person, place, and time. She appears well-developed and well-nourished.  HENT:  Head: Normocephalic and atraumatic.  Neck: Normal range of motion. Neck supple.  Cervical Paraspinal Tenderness: C-5-C-6  Cardiovascular: Normal rate and regular rhythm.  Pulmonary/Chest: Effort normal and breath sounds normal.  Musculoskeletal:  Normal  Muscle Bulk and Muscle Testing Reveals: Upper Extremities:  Full ROM and Muscle Strength on the Right  5/5 Left: 4/5 Left AC Joint Tenderness Thoracic Hypersensitivity : T-1-T-7 Lumbar Paraspinal Tenderness: L-3-L-5 Left Greater Trochanter Tenderness Lower Extremities: Full ROM and Muscle Strength 5/5 Left Lower Extremity Flexion Produces Pain into Left Buttock and left lower extremity Arises from Table slowly using straight cane for support Antalgic Gait  Neurological: She is alert and oriented to person, place, and time.  Skin: Skin is warm and dry.  Psychiatric: She has a normal mood and affect.  Nursing note and vitals reviewed.         Assessment & Plan:  1. 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 Patient s/p cervical spine surgery per Dr Patrice Paradise on Mar 09, 2012.  Continue Zonegran at 100 mg HS. 11/16/2017 2. Lumbar degenerative disc disease as well as facet arthropathy and chronic low back pain. Continue HEP. 11/16/2017 3. Lumbar Radiculopathy/ Neuropathic left upper extremity and left lower extremity pain: Continue Zonegran. 11/16/2017 4. Left Greater trochantericbursitis: Continue Ice Therapy and HEP.  11/16/2017 5. Chronic pain syndrome: 11/16/2017 Refilled oxycodone 15mg  one tablet QID as needed #120.  We will continue the opioid monitoring program, this consists of regular clinic visits, examinations, urine drug screen, pill counts as well as use of [place] Controlled Substance Reporting System.  6. Depression/anxiety-: Psychiatry Following: Continue Klonopin.11/16/2017 7. Muscle Spasm: Continue Flexeril. 11/16/2017 8. 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 Frequent Falls: Educated on Falls Prevention: Referral to Neurology.  20 minutes of face to face patient care time was spent during this visit. All questions were encouraged and answered.   F/U in 1 month

## 2017-11-17 ENCOUNTER — Encounter: Payer: Self-pay | Admitting: Neurology

## 2017-11-19 LAB — DRUG TOX MONITOR 1 W/CONF, ORAL FLD

## 2017-11-19 LAB — DRUG TOX ALC METAB W/CON, ORAL FLD: ALCOHOL METABOLITE: NEGATIVE ng/mL (ref <25)

## 2017-11-30 ENCOUNTER — Telehealth: Payer: Self-pay | Admitting: *Deleted

## 2017-11-30 NOTE — Telephone Encounter (Signed)
Oral swab drug screen was negative for prescribed medications. Last reported dose was day of test.  All previous urine tests have been appropriate.

## 2017-12-03 ENCOUNTER — Encounter: Payer: Self-pay | Admitting: Cardiology

## 2017-12-03 ENCOUNTER — Ambulatory Visit (INDEPENDENT_AMBULATORY_CARE_PROVIDER_SITE_OTHER): Payer: Medicare Other | Admitting: Cardiology

## 2017-12-03 VITALS — BP 108/60 | HR 96 | Ht 63.0 in | Wt 157.0 lb

## 2017-12-03 DIAGNOSIS — R0602 Shortness of breath: Secondary | ICD-10-CM | POA: Diagnosis not present

## 2017-12-03 DIAGNOSIS — I2510 Atherosclerotic heart disease of native coronary artery without angina pectoris: Secondary | ICD-10-CM | POA: Diagnosis not present

## 2017-12-03 DIAGNOSIS — M7989 Other specified soft tissue disorders: Secondary | ICD-10-CM

## 2017-12-03 DIAGNOSIS — R071 Chest pain on breathing: Secondary | ICD-10-CM | POA: Diagnosis not present

## 2017-12-03 NOTE — Progress Notes (Signed)
PCP: Kelton Pillar, MD  Clinic Note: Chief Complaint  Patient presents with  . Follow-up    coronary artery calcification; SOB, edema     HPI: Allison Lee is a 62 y.o. female who is being seen today for follow-up after initial evaluation of Coronary artery calcification along with some chest discomfort and shortness of breath at the request of  Kelton Pillar, MD  . H/o PE in 2015 -- 1 yr on Xarelto.   COPD -> Quit smoking in late Oct 2018.   RUDEAN ICENHOUR was seen in initial consult back in December to evaluate coronary calcification and some chest discomfort associated with COPD exacerbation.  Evaluated with a 2D echocardiogram and coronary calcium score.  Recent Hospitalizations:   08/21/2017: COPD, r/o PE - home on Abx.  - none since  Studies Personally Reviewed - (if available, images/films reviewed: From Epic Chart or Care Everywhere)  2D Echo December 2018: Normal EF 60-65%.  Normal diastolic function.  Aortic sclerosis with no stenosis.  Mildly elevated pulmonary pressures.  Peak pressures estimated 46 mmHg.  Normal central venous pressure.  Coronary artery calcium score 43 -low risk   Interval History: Allison Lee presents today for cardiology evaluation.  She was very happy with the results of her coronary calcium score and 2D echocardiogram.  She has not had any further chest tightness or pressure with either rest or exertion.  She still has some coughing, but has definitely noted the benefit of having quit smoking.  Less frequent cough. She still has lower extremity swelling for which she takes furosemide.  But her edema does get better with elevating her feet.  No associated PND, orthopnea, indicating that is probably not cardiac in nature. She still has chronic exertional dyspnea, but no Exacerbations and no resting dyspnea or chest tightness/pressure with rest or exertion.  Remainder of cardiac review of symptoms:  No palpitations, lightheadedness, dizziness,  weakness or syncope/near syncope. No TIA/amaurosis fugax symptoms. No claudication.   ROS: A comprehensive was performed. Review of Systems  Constitutional: Negative for malaise/fatigue.  HENT: Negative for nosebleeds and sinus pain.   Respiratory: Positive for cough (much better) and shortness of breath. Negative for sputum production and wheezing (only intermittently now - much better).        COPD exacerbation seems to have cleared up.  Cardiovascular: Positive for leg swelling (stable).       Per HPI  Gastrointestinal: Negative for blood in stool, constipation and melena.  Genitourinary: Negative for dysuria, frequency and hematuria.  Musculoskeletal: Positive for joint pain (Left hip and leg pain). Negative for falls.  Neurological: Negative for dizziness and weakness.  Psychiatric/Behavioral: Negative for depression and memory loss. The patient does not have insomnia.     I have reviewed and (if needed) personally updated the patient's problem list, medications, allergies, past medical and surgical history, social and family history.   Past Medical History:  Diagnosis Date  . Arthritis   . COPD (chronic obstructive pulmonary disease) ([place])   . Dysthymia   . GERD (gastroesophageal reflux disease)   . History of ischemic colitis   . History of pulmonary embolism 2015   1 year of Xarelto  . IBS (irritable bowel syndrome)   . Osteoporosis   . Other chronic pain   . Smoker     Past Surgical History:  Procedure Laterality Date  . CERVICAL SPINE SURGERY     x 2  . FOOT SURGERY  2009    Current Meds  Medication Sig  . Calcium-Vitamin D 500-100 MG-UNIT WAFR Take 1 each by mouth 2 (two) times daily.   . clonazePAM (KLONOPIN) 1 MG tablet Take 1 mg by mouth 2 (two) times daily.   . cyclobenzaprine (FLEXERIL) 10 MG tablet Take 1 tablet (10 mg total) by mouth 3 (three) times daily as needed for muscle spasms.  . DULoxetine (CYMBALTA) 30 MG capsule Take 30 mg by mouth daily.    Marland Kitchen omeprazole (PRILOSEC) 40 MG capsule Take 40 mg by mouth daily.   Marland Kitchen oxyCODONE (ROXICODONE) 15 MG immediate release tablet Take 1 tablet (15 mg total) by mouth 4 (four) times daily as needed.  . traZODone (DESYREL) 100 MG tablet Take 2 tablets (200 mg total) by mouth at bedtime.  Marland Kitchen zonisamide (ZONEGRAN) 100 MG capsule Take 2 capsules (200 mg total) by mouth at bedtime.    MOVANTIK  Allergies  Allergen Reactions  . Methadone Other (See Comments)    Fluid retention  . Hydrocodone Nausea Only  . Topamax Other (See Comments)    osteoporosis  . Hctz [Hydrochlorothiazide] Rash  . Neurontin [Gabapentin] Swelling  . Pregabalin Other (See Comments)    Dizziness    Social History   Socioeconomic History  . Marital status: Married    Spouse name: None  . Number of children: 2  . Years of education: 95  . Highest education level: High school graduate  Social Needs  . Financial resource strain: None  . Food insecurity - worry: None  . Food insecurity - inability: None  . Transportation needs - medical: None  . Transportation needs - non-medical: None  Occupational History  . Occupation: DISABLED    Employer: UNEMPLOYED  Tobacco Use  . Smoking status: Former Smoker    Packs/day: 1.00    Years: 35.00    Pack years: 35.00    Types: Cigarettes    Last attempt to quit: 08/31/2017    Years since quitting: 0.2  . Smokeless tobacco: Never Used  . Tobacco comment: trying to cut back  Substance and Sexual Activity  . Alcohol use: No  . Drug use: No  . Sexual activity: None  Other Topics Concern  . None  Social History Narrative   Lives at home with husband.  They have 2 children.  She is currently disabled because of history of ischemic colitis and COPD.  She does band stretching exercises, but no real active walking type exercise.      She quit smoking on October 30 after getting home from her COPD exacerbation.    family history includes CAD (age of onset: 11) in her father;  COPD in her mother; Cancer in her brother, father, and mother; Cirrhosis in her brother; Dementia in her mother.  Wt Readings from Last 3 Encounters:  12/03/17 157 lb (71.2 kg)  10/05/17 153 lb (69.4 kg)  02/25/17 168 lb 9.6 oz (76.5 kg)    PHYSICAL EXAM BP 108/60   Pulse 96   Ht 5\' 3"  (1.6 m)   Wt 157 lb (71.2 kg)   SpO2 97%   BMI 27.81 kg/m  Physical Exam  Constitutional: She is oriented to person, place, and time. She appears well-developed and well-nourished. No distress.  Well-groomed.  HENT:  Head: Normocephalic and atraumatic.  Mouth/Throat: Oropharynx is clear and moist. No oropharyngeal exudate.  Neck: Neck supple. No hepatojugular reflux and no JVD present. Carotid bruit is not present.  Cardiovascular: Normal rate, regular rhythm, S1 normal, S2 normal and normal pulses.  No extrasystoles are present. PMI is not displaced. Exam reveals distant heart sounds (Diminished). Exam reveals no gallop and no friction rub.  No murmur heard. Borderline tachycardic  Pulmonary/Chest: Effort normal. No respiratory distress. She has no wheezes. She has no rales.  Mild interstitial sounds - no more rhonchi or wheezing  Musculoskeletal: Normal range of motion. She exhibits no edema.  Lymphadenopathy:    She has no cervical adenopathy (Shot[place]).  Neurological: She is alert and oriented to person, place, and time.  Psychiatric: She has a normal mood and affect. Her behavior is normal. Judgment and thought content normal.  Nursing note and vitals reviewed.    Adult ECG Report n/a   Other studies Reviewed: Additional studies/ records that were reviewed today include:  Recent Labs:   Lab Results  Component Value Date   CREATININE 0.86 08/21/2017   BUN 11 08/21/2017   NA 137 08/21/2017   K 3.1 (L) 08/21/2017   CL 98 (L) 08/21/2017   CO2 28 08/21/2017   Lab Results  Component Value Date   CHOL 155 02/25/2017   HDL 40.00 02/25/2017   LDLCALC 80 02/25/2017   TRIG 176.0 (H)  02/25/2017   CHOLHDL 4 02/25/2017   Lab Results  Component Value Date   WBC 9.7 08/21/2017   HGB 14.7 08/21/2017   HCT 43.3 08/21/2017   MCV 92.7 08/21/2017   PLT 269 08/21/2017    ASSESSMENT / PLAN: Problem List Items Addressed This Visit    Chest pain    No longer having any chest pain symptoms.  Was probably related to chest wall pain from coughing.  Relatively normal coronary calcium score would argue against ischemic CAD.      Coronary artery calcification seen on CAT scan - Primary (Chronic)    Low risk stratification by coronary calcium score.  Recommendation would be continued risk factor modification.  She has quit smoking now.  She does not have diabetes.  Would recommend close glycemic control as well as lipid control.  Currently with LDL of 80, she is relatively well controlled.   Recommend staying active with exercise.      Leg swelling (Chronic)    Not likely related to cardiac etiology.  Echocardiogram was relatively normal with normal right heart pressures.  I suspect this is probably related to venous stasis.  We talked about using support stockings to help in addition to her Lasix.  She seemed to be happy with that option.      Shortness of breath (Chronic)    Relatively normal echo.  I would probably exclude a cardiac etiology for dyspnea.  More than likely related to her COPD         Current medicines are reviewed at length with the patient today. (+/- concerns) none The following changes have been made: none  Patient Instructions  NO CHANGE WITH MEDICATION    RECOMMEND YOU WEAR SUPPORT STOCKING/COMPRESSION SOCKS DURING THE DAY.     Your physician recommends that you schedule a follow-up appointment ON AN AS NEEDED BASIS.   Studies Ordered:   No orders of the defined types were placed in this encounter.      Glenetta Hew, M.D., M.S. Interventional Cardiologist   Pager # [phone] Phone # [phone] [address]. [place], [place] [address]

## 2017-12-03 NOTE — Patient Instructions (Signed)
NO CHANGE WITH MEDICATION    RECOMMEND YOU WEAR SUPPORT STOCKING/COMPRESSION SOCKS DURING THE DAY.     Your physician recommends that you schedule a follow-up appointment ON AN AS NEEDED BASIS.

## 2017-12-05 ENCOUNTER — Encounter: Payer: Self-pay | Admitting: Cardiology

## 2017-12-05 NOTE — Assessment & Plan Note (Signed)
Relatively normal echo.  I would probably exclude a cardiac etiology for dyspnea.  More than likely related to her COPD

## 2017-12-05 NOTE — Assessment & Plan Note (Signed)
Low risk stratification by coronary calcium score.  Recommendation would be continued risk factor modification.  She has quit smoking now.  She does not have diabetes.  Would recommend close glycemic control as well as lipid control.  Currently with LDL of 80, she is relatively well controlled.   Recommend staying active with exercise.

## 2017-12-05 NOTE — Assessment & Plan Note (Signed)
No longer having any chest pain symptoms.  Was probably related to chest wall pain from coughing.  Relatively normal coronary calcium score would argue against ischemic CAD.

## 2017-12-05 NOTE — Assessment & Plan Note (Signed)
Not likely related to cardiac etiology.  Echocardiogram was relatively normal with normal right heart pressures.  I suspect this is probably related to venous stasis.  We talked about using support stockings to help in addition to her Lasix.  She seemed to be happy with that option.

## 2017-12-14 ENCOUNTER — Encounter: Payer: Self-pay | Admitting: Physical Medicine & Rehabilitation

## 2017-12-14 ENCOUNTER — Encounter: Payer: Medicare Other | Attending: Physical Medicine & Rehabilitation | Admitting: Physical Medicine & Rehabilitation

## 2017-12-14 ENCOUNTER — Other Ambulatory Visit: Payer: Self-pay

## 2017-12-14 VITALS — BP 104/74 | HR 110

## 2017-12-14 DIAGNOSIS — M961 Postlaminectomy syndrome, not elsewhere classified: Secondary | ICD-10-CM

## 2017-12-14 DIAGNOSIS — Z9889 Other specified postprocedural states: Secondary | ICD-10-CM | POA: Insufficient documentation

## 2017-12-14 DIAGNOSIS — M5412 Radiculopathy, cervical region: Secondary | ICD-10-CM

## 2017-12-14 DIAGNOSIS — Z5181 Encounter for therapeutic drug level monitoring: Secondary | ICD-10-CM | POA: Diagnosis not present

## 2017-12-14 DIAGNOSIS — M4302 Spondylolysis, cervical region: Secondary | ICD-10-CM | POA: Insufficient documentation

## 2017-12-14 DIAGNOSIS — M5416 Radiculopathy, lumbar region: Secondary | ICD-10-CM

## 2017-12-14 DIAGNOSIS — Z79891 Long term (current) use of opiate analgesic: Secondary | ICD-10-CM

## 2017-12-14 DIAGNOSIS — Z79899 Other long term (current) drug therapy: Secondary | ICD-10-CM | POA: Diagnosis not present

## 2017-12-14 DIAGNOSIS — M542 Cervicalgia: Secondary | ICD-10-CM | POA: Diagnosis not present

## 2017-12-14 DIAGNOSIS — G8929 Other chronic pain: Secondary | ICD-10-CM | POA: Insufficient documentation

## 2017-12-14 DIAGNOSIS — M47816 Spondylosis without myelopathy or radiculopathy, lumbar region: Secondary | ICD-10-CM | POA: Diagnosis not present

## 2017-12-14 DIAGNOSIS — M7062 Trochanteric bursitis, left hip: Secondary | ICD-10-CM | POA: Diagnosis not present

## 2017-12-14 MED ORDER — OXYCODONE HCL 15 MG PO TABS: 15.0000 mg | ORAL_TABLET | Freq: Four times a day (QID) | ORAL | 0 refills | Status: DC | PRN

## 2017-12-14 MED ORDER — PREDNISONE 20 MG PO TABS: 20.0000 mg | ORAL_TABLET | ORAL | 0 refills | Status: DC

## 2017-12-14 NOTE — Progress Notes (Signed)
Subjective:    Patient ID: Allison Lee, female    DOB: 07/02/1956, 62 y.o.   MRN: [id number]  HPI  Allison Lee is here in follow up of her chronic pain. She has had increased pain in her left arm with associated numbness and tingling. She also is having more problems with her balance. Her left leg has been giving out and has had significant in crease in pain. Symptoms have been worsening for the last 3 months.   I reviewed her lumbar MRI from 2015 which revealed:  1.  Progressive L4-L5 degenerative disc disease, with moderate left foraminal stenosis and left lateral recess stenosis potentially affecting the left L4 and L5 nerves.  Mild L4-L5 central stenosis. Progressive degenerative discogenic endplate edema and inflammatory changes surrounding the exiting left L4 nerve. 2.  Progressive L3-L4 degenerative disc disease with mild left lateral recess and left foraminal stenosis.    Pain Inventory Average Pain 6 Pain Right Now 7 My pain is constant, sharp, burning, dull, stabbing, tingling and aching  In the last 24 hours, has pain interfered with the following? General activity 9 Relation with others 9 Enjoyment of life 9 What TIME of day is your pain at its worst? all Sleep (in general) Poor  Pain is worse with: walking, bending, sitting and standing Pain improves with: rest and medication Relief from Meds: 6  Mobility walk without assistance walk with assistance use a cane how many minutes can you walk? 3-5 do you drive?  yes  Function disabled: date disabled 2005 I need assistance with the following:  dressing, bathing, meal prep, household duties and shopping  Neuro/Psych weakness numbness tingling spasms depression anxiety  Prior Studies Any changes since last visit?  no  Has appt to see neurologist  Physicians involved in your care Any changes since last visit?  no   Family History  Problem Relation Age of Onset  . COPD Mother   . Cancer  Mother   . Dementia Mother   . Cancer Father   . CAD Father 68  . Cancer Brother   . Cirrhosis Brother    Social History   Socioeconomic History  . Marital status: Married    Spouse name: None  . Number of children: 2  . Years of education: 49  . Highest education level: High school graduate  Social Needs  . Financial resource strain: None  . Food insecurity - worry: None  . Food insecurity - inability: None  . Transportation needs - medical: None  . Transportation needs - non-medical: None  Occupational History  . Occupation: DISABLED    Employer: UNEMPLOYED  Tobacco Use  . Smoking status: Former Smoker    Packs/day: 1.00    Years: 35.00    Pack years: 35.00    Types: Cigarettes    Last attempt to quit: 08/31/2017    Years since quitting: 0.2  . Smokeless tobacco: Never Used  . Tobacco comment: trying to cut back  Substance and Sexual Activity  . Alcohol use: No  . Drug use: No  . Sexual activity: None  Other Topics Concern  . None  Social History Narrative   Lives at home with husband.  They have 2 children.  She is currently disabled because of history of ischemic colitis and COPD.  She does band stretching exercises, but no real active walking type exercise.      She quit smoking on October 30 after getting home from her COPD exacerbation.   Past  Surgical History:  Procedure Laterality Date  . CERVICAL SPINE SURGERY     x 2  . FOOT SURGERY  2009   Past Medical History:  Diagnosis Date  . Arthritis   . COPD (chronic obstructive pulmonary disease) ([place])   . Dysthymia   . GERD (gastroesophageal reflux disease)   . History of ischemic colitis   . History of pulmonary embolism 2015   1 year of Xarelto  . IBS (irritable bowel syndrome)   . Osteoporosis   . Other chronic pain   . Smoker    BP 104/74   Pulse (!) 110   SpO2 98%   Opioid Risk Score:  1 Fall Risk Score:  `1  Depression screen PHQ 2/9  Depression screen [facility] 2/9 12/14/2017 11/16/2017  09/21/2017 08/24/2017 02/08/2017 07/21/2016 06/23/2016  Decreased Interest 3 1 1 1 3  - 3  Down, Depressed, Hopeless 3 1 1 1 3  - 3  PHQ - 2 Score 6 2 2 2 6  - 6  Altered sleeping - - - - - 3 3  Tired, decreased energy - - - - - 3 3  Change in appetite - - - - - - 0  Feeling bad or failure about yourself  - - - - - - 2  Trouble concentrating - - - - - - 2  Moving slowly or fidgety/restless - - - - - - 2  Suicidal thoughts - - - - - - 0  PHQ-9 Score - - - - - - 18  Difficult doing work/chores - - - - - - Very difficult    Review of Systems  Constitutional: Positive for unexpected weight change.  HENT: Negative.   Eyes: Negative.   Respiratory: Positive for shortness of breath.   Cardiovascular: Positive for leg swelling.  Gastrointestinal: Positive for constipation.  Endocrine: Negative.   Genitourinary: Negative.   Musculoskeletal:       Spasms  Skin: Negative.   Allergic/Immunologic: Negative.   Neurological: Positive for weakness and numbness.       Tingling  Hematological: Bruises/bleeds easily.  Psychiatric/Behavioral: Positive for dysphoric mood. The patient is nervous/anxious.   All other systems reviewed and are negative.      Objective:   Physical Exam  General: Ax0---3. HEENT:PERRL Neck:Supple without JVD or lymphadenopathy Heart: RRR. Chest: CTA B Abdomen:Soft, non-tender, non-distended, bowel sounds positive. Extremities:No clubbing, cyanosis, or tr LE edema. Pulses are 2+ Skin:Clean and intact without signs of breakdown Neuro:Pt is cognitively appropriate with normal insight, memory, and awareness. Cranial nerves 2-12 are intact. Sensory exam is normal. Reflexes are 1+ in all 4's. Fine motor coordination is grosslyintact. Motor function is grossly 4/5 to 5/5 in the RUE  with some pain inhibition in left arm (4/5) RLE: 4+/5 prox to distal with pain inhibition also. LLE with 4/5 strength and significant pain inhibition. ?mild left ADF weakness 3-/5?Marland Kitchen    Musculoskeletal:Cervical ROM  limited in all planes today including with flexion and extension. . Low back reveals mild lumbar levoscoliosis and prominence of the left lumbar musculature with persistent tilt to the right. +SLR on left. signficant pain with lumbar ROM particularly flexion.  Psych:Affect pleasant      Assessment & Plan:  1. Cervical spondylolysis with chronic neck pain and decreased range of motion, chronic left upper extremity pain. Patient is s/p cervical spine surgery per Dr Patrice Paradise on Mar 09, 2012. No notes available regarding procedure.  2. Lumbar degenerative disc disease as well as facet arthropathy and chronic low  back pain.     -recent increase falls, LOB.   -appears to have disc,radicular signs  -MRI lumbar spine -suspect left arm symptoms are related to chronic cervical radic/post-laminectomy 3. 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  -maintain zonegran to 200mg  at HS    -dc trazodone at night (sleep isn't problem, pain is) 4. Left greater troch bursitis. Pain is most likely from back -continue Ice -stretching reviewed.  5. Left RTC syndrome- persistent pain             -continue HEP.   Basic ROM 6. Chronic pain syndrome  Refilled oxycodone 15mg  4x daily #120 -maintain flexeril to 10mg  TID -smoking cessation 7. Depression/anxiety---continue psych care. Needs to work on better coping strategies. -consider titrating klonopin to 1mg  q12 8. Recent falls:likely related to low back and likely radic    43minutes of face to face patient care time were spent during this visit. All questions were encouraged and answered. Follow up in a month with me

## 2017-12-17 ENCOUNTER — Ambulatory Visit
Admission: RE | Admit: 2017-12-17 | Discharge: 2017-12-17 | Disposition: A | Discharge status: Home / Self Care | Payer: Medicare Other | Source: Ambulatory Visit | Attending: Physical Medicine & Rehabilitation | Admitting: Physical Medicine & Rehabilitation

## 2017-12-17 DIAGNOSIS — M5416 Radiculopathy, lumbar region: Secondary | ICD-10-CM

## 2017-12-18 NOTE — Telephone Encounter (Signed)
Reviewed lumbar MRI:  L4-5: Chronic disc space narrowing asymmetric to the left with degenerative changes the vertebral endplates asymmetric to the left, stable. Tiny broad-based disc bulge extending foraminal and extraforaminal on the left with slight compression of the left lateral recess although this appears less prominent than on the prior study. Slight hypertrophy of the ligamentum flavum, unchanged. No foraminal stenosis.  Recommend translaminar ESI paracentral to left at L4-5 per Dr. Letta Pate. Please contact pt and ask if she would like to proceed.   thx

## 2017-12-19 LAB — TOXASSURE SELECT,+ANTIDEPR,UR

## 2017-12-20 ENCOUNTER — Telehealth: Payer: Self-pay | Admitting: *Deleted

## 2017-12-20 NOTE — Telephone Encounter (Signed)
Jerome notified.  She is willing to try if he thinks this will help.  I have transferred her to appts to schedule.

## 2017-12-20 NOTE — Telephone Encounter (Signed)
Urine drug screen for this encounter is consistent for prescribed medication 

## 2017-12-21 ENCOUNTER — Ambulatory Visit: Payer: Medicare Other | Admitting: Physical Medicine & Rehabilitation

## 2018-01-03 ENCOUNTER — Encounter: Payer: Medicare Other | Attending: Physical Medicine & Rehabilitation

## 2018-01-03 ENCOUNTER — Encounter: Payer: Self-pay | Admitting: Physical Medicine & Rehabilitation

## 2018-01-03 ENCOUNTER — Ambulatory Visit: Payer: Medicare Other | Admitting: Physical Medicine & Rehabilitation

## 2018-01-03 VITALS — BP 132/84 | HR 105 | Resp 14

## 2018-01-03 DIAGNOSIS — M4302 Spondylolysis, cervical region: Secondary | ICD-10-CM | POA: Insufficient documentation

## 2018-01-03 DIAGNOSIS — Z9889 Other specified postprocedural states: Secondary | ICD-10-CM | POA: Diagnosis not present

## 2018-01-03 DIAGNOSIS — M5416 Radiculopathy, lumbar region: Secondary | ICD-10-CM

## 2018-01-03 DIAGNOSIS — G8929 Other chronic pain: Secondary | ICD-10-CM | POA: Insufficient documentation

## 2018-01-03 NOTE — Progress Notes (Signed)
  PROCEDURE RECORD [facility]   Name: Allison Lee DOB:1956-04-12 MRN: [id number]  Date:01/03/2018  Physician: Alysia Penna, MD    Nurse/CMA: Tamia Dial, CMA   Allergies:  Allergies  Allergen Reactions  . Methadone Other (See Comments)    Fluid retention  . Hydrocodone Nausea Only  . Topamax Other (See Comments)    osteoporosis  . Hctz [Hydrochlorothiazide] Rash  . Neurontin [Gabapentin] Swelling  . Pregabalin Other (See Comments)    Dizziness    Consent Signed: Yes.    Is patient diabetic? No.  CBG today?   Pregnant: No. LMP: No LMP recorded. Patient is postmenopausal. (age 16-55)  Anticoagulants: no Anti-inflammatory: no Antibiotics: no  Procedure: translaminar ESI  Position: Prone Start Time:  3:09pm     End Time: 3:17pm  Fluoro Time: 23s  RN/CMA Eryk Beavers, CMA Jermey Closs, CMA    Time 2:45pm 3:22pm    BP 132/84 128/76    Pulse 105 100    Respirations 14 14    O2 Sat 99 99    S/S 6 6    Pain Level 7/10 5/10     D/C home with husband, patient A & O X 3, D/C instructions reviewed, and sits independently.

## 2018-01-03 NOTE — Patient Instructions (Signed)

## 2018-01-10 ENCOUNTER — Encounter: Payer: Self-pay | Admitting: Physical Medicine & Rehabilitation

## 2018-01-10 ENCOUNTER — Encounter: Payer: Medicare Other | Admitting: Physical Medicine & Rehabilitation

## 2018-01-10 DIAGNOSIS — M7062 Trochanteric bursitis, left hip: Secondary | ICD-10-CM | POA: Diagnosis not present

## 2018-01-10 DIAGNOSIS — M47816 Spondylosis without myelopathy or radiculopathy, lumbar region: Secondary | ICD-10-CM

## 2018-01-10 DIAGNOSIS — M961 Postlaminectomy syndrome, not elsewhere classified: Secondary | ICD-10-CM

## 2018-01-10 DIAGNOSIS — M5416 Radiculopathy, lumbar region: Secondary | ICD-10-CM

## 2018-01-10 DIAGNOSIS — M542 Cervicalgia: Secondary | ICD-10-CM | POA: Diagnosis not present

## 2018-01-10 DIAGNOSIS — M4302 Spondylolysis, cervical region: Secondary | ICD-10-CM | POA: Diagnosis not present

## 2018-01-10 MED ORDER — TIZANIDINE HCL 4 MG PO TABS: 2.0000 mg | ORAL_TABLET | Freq: Three times a day (TID) | ORAL | 2 refills | Status: DC | PRN

## 2018-01-10 MED ORDER — OXYCODONE HCL 15 MG PO TABS: 15.0000 mg | ORAL_TABLET | Freq: Four times a day (QID) | ORAL | 0 refills | Status: DC | PRN

## 2018-01-10 MED ORDER — ZONISAMIDE 100 MG PO CAPS: 100.0000 mg | ORAL_CAPSULE | Freq: Three times a day (TID) | ORAL | 2 refills | Status: DC

## 2018-01-10 MED ORDER — PREDNISONE 20 MG PO TABS: 20.0000 mg | ORAL_TABLET | ORAL | 0 refills | Status: DC

## 2018-01-10 NOTE — Progress Notes (Signed)
Subjective:    Patient ID: Allison Lee, female    DOB: 08/07/1956, 62 y.o.   MRN: [id number]  HPI   The lumbar ESI performed by Dr. Letta Pate did not help, and in fact, her pain has been WORSE since the injection.  She is ongoing pain in her back with pain radiating down the left leg into the foot which she describes as tingling and burning.  We reviewed her MRI once again today which demonstrates the following. L3-4: Small broad-based disc bulge slightly asymmetric to the left with slight compression of the left lateral recess, less prominent than on the prior exam. Minimal hypertrophy of the ligamentum flavum, unchanged.  L4-5: Chronic disc space narrowing asymmetric to the left with degenerative changes the vertebral endplates asymmetric to the left, stable. Tiny broad-based disc bulge extending foraminal and extraforaminal on the left with slight compression of the left lateral recess although this appears less prominent than on the prior study. Slight hypertrophy of the ligamentum flavum, unchanged. No foraminal stenosis.  L5-S1: Disc desiccation with a small broad-based disc bulge with narrowing of both lateral recesses, unchanged. Slight hypertrophy of the facet joints and ligamentum flavum, unchanged.  Another issue taking place at this point is the care that her mother is requiring.  Apparently her sisters work and are not willing to assist with her mother's care.  Allison Lee, as result, has had to take on more of the physical aspects of her mother's care.  She has noticed that with increased lifting bending and pulling, especially with weight, that her pain has increased.  She uses the oxycodone which does give some relief.  Flexeril helps her rest somewhat but she is really not resting well at night due to pain and sometimes anxiety.  She is tolerating Zonegran currently which she takes 200 mg of at bedtime.  She has had problems tolerating other anticonvulsants in the  past.  Pain Inventory Average Pain 8 Pain Right Now 8 My pain is constant, sharp, burning, dull, stabbing, tingling and aching  In the last 24 hours, has pain interfered with the following? General activity 9 Relation with others 9 Enjoyment of life 9 What TIME of day is your pain at its worst? all Sleep (in general) Poor  Pain is worse with: walking, bending, sitting, inactivity and standing Pain improves with: rest and medication Relief from Meds: 5  Mobility walk with assistance use a cane how many minutes can you walk? 3-5 ability to climb steps?  yes do you drive?  yes transfers alone  Function disabled: date disabled . I need assistance with the following:  dressing, bathing, meal prep and shopping Do you have any goals in this area?  no  Neuro/Psych weakness numbness tingling trouble walking depression anxiety  Prior Studies Any changes since last visit?  no  Physicians involved in your care Any changes since last visit?  no   Family History  Problem Relation Age of Onset  . COPD Mother   . Cancer Mother   . Dementia Mother   . Cancer Father   . CAD Father 36  . Cancer Brother   . Cirrhosis Brother    Social History   Socioeconomic History  . Marital status: Married    Spouse name: None  . Number of children: 2  . Years of education: 64  . Highest education level: High school graduate  Social Needs  . Financial resource strain: None  . Food insecurity - worry: None  . Food  insecurity - inability: None  . Transportation needs - medical: None  . Transportation needs - non-medical: None  Occupational History  . Occupation: DISABLED    Employer: UNEMPLOYED  Tobacco Use  . Smoking status: Former Smoker    Packs/day: 1.00    Years: 35.00    Pack years: 35.00    Types: Cigarettes    Last attempt to quit: 08/31/2017    Years since quitting: 0.3  . Smokeless tobacco: Never Used  . Tobacco comment: trying to cut back  Substance and  Sexual Activity  . Alcohol use: No  . Drug use: No  . Sexual activity: None  Other Topics Concern  . None  Social History Narrative   Lives at home with husband.  They have 2 children.  She is currently disabled because of history of ischemic colitis and COPD.  She does band stretching exercises, but no real active walking type exercise.      She quit smoking on October 30 after getting home from her COPD exacerbation.   Past Surgical History:  Procedure Laterality Date  . CERVICAL SPINE SURGERY     x 2  . FOOT SURGERY  2009   Past Medical History:  Diagnosis Date  . Arthritis   . COPD (chronic obstructive pulmonary disease) ([place])   . Dysthymia   . GERD (gastroesophageal reflux disease)   . History of ischemic colitis   . History of pulmonary embolism 2015   1 year of Xarelto  . IBS (irritable bowel syndrome)   . Osteoporosis   . Other chronic pain   . Smoker    BP 96/68 (BP Location: Left Arm, Patient Position: Sitting, Cuff Size: Normal)   Pulse (!) 110   Resp 14   SpO2 97%   Opioid Risk Score:   Fall Risk Score:  `1  Depression screen PHQ 2/9  Depression screen [facility] 2/9 12/14/2017 11/16/2017 09/21/2017 08/24/2017 02/08/2017 07/21/2016 06/23/2016  Decreased Interest 3 1 1 1 3  - 3  Down, Depressed, Hopeless 3 1 1 1 3  - 3  PHQ - 2 Score 6 2 2 2 6  - 6  Altered sleeping - - - - - 3 3  Tired, decreased energy - - - - - 3 3  Change in appetite - - - - - - 0  Feeling bad or failure about yourself  - - - - - - 2  Trouble concentrating - - - - - - 2  Moving slowly or fidgety/restless - - - - - - 2  Suicidal thoughts - - - - - - 0  PHQ-9 Score - - - - - - 18  Difficult doing work/chores - - - - - - Very difficult    Review of Systems  Constitutional: Positive for unexpected weight change.  Eyes: Negative.   Respiratory: Positive for shortness of breath.   Cardiovascular: Positive for leg swelling.  Gastrointestinal: Positive for constipation.  Endocrine: Negative.    Genitourinary: Negative.   Musculoskeletal: Positive for arthralgias, back pain, gait problem, neck pain and neck stiffness.  Allergic/Immunologic: Negative.   Neurological: Positive for weakness and numbness.       Tingling   Psychiatric/Behavioral: Positive for dysphoric mood. The patient is nervous/anxious.        Objective:   Physical Exam General: No acute distress HEENT: EOMI, oral membranes moist Cards: reg rate  Chest: normal effort Abdomen: Soft, NT, ND Skin: dry, intact Extremities: no edema   Skin:Clean and intact without signs of  breakdown Neuro:Pt is cognitively appropriate with normal insight, memory, and awareness. Cranial nerves 2-12 are intact. Sensory exam is normal. Reflexes are 1+ in all 4's. Fine motor coordination is grosslyintact. Motor remains grossly 4/5 to 5/5 in the RUE  with some pain inhibition in left arm (4/5) RLE: 4+ to 5-/5 prox to distal with pain inhibition also. LLE with 4 to 4+/5 strength with ongoing pain inhibition. I did NOT see weakness with ADF today.  Musculoskeletal:She remains generally limited with cervical range of motion as well as lumbar range of motion today.  Mild levoscoliosis of lumbar spine is seen.  Straight leg testing is positive on the right as is seated slump test.  She walks with antalgia favoring the left leg.  Cervical ROM limited in all planes today including with flexion and extension. Marland Kitchen  Psych:Affect pleasant but anxious      Assessment & Plan:  1. Cervical spondylolysis with chronic neck pain and decreased range of motion, chronic left upper extremity pain. Patient is s/p cervical spine surgery per Dr Patrice Paradise on Mar 09, 2012. No notes available regarding procedure.  2. Lumbar degenerative disc disease as well as facet arthropathy and chronic low back pain.                          -Discussed the fact that she cannot continue to try to carry the load of physical care that she currently is given her back  issues.  Suggested talks with her family as well as use of more adaptive equipment and motivation of her mother to do more for herself..            -Ongoing disc,radicular signs  -Did not tolerate lumbar epidural steroid injection.     -No radiographic or clinical indications for operative opinion.  Patient does not want to pursue any surgical opinion either. -suspect left arm symptoms are related to chronic cervical radic/post-laminectomy 3. 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  -Increase Zonegran 200 mg in the morning and 200 mg at night             4. Left greater troch bursitis. Pain is most likely from back -continue Ice -stretching reviewed.  5. Left RTC syndrome-persistent pain -continue HEP.  Basic ROM 6. Chronic pain syndrome  Refilled oxycodone 15mg  4x daily 120. We will continue the opioid monitoring program, this consists of regular clinic visits, examinations, routine drug screening, pill counts as well as use of [place] Controlled Substance Reporting System. [place] today.   -Discontinue Flexeril and begin trial of Zanaflex 2-4 mg every 8 hours as needed -smoking cessation 7. Depression/anxiety---continue psych care.  Coping is definitely still an issue.  Also there are many psychosocial dynamics going on playing a role here as well. -consider titrating klonopin to 1mg  q12 8. Recent falls:likely related to low back and likely radic    25 minutes of  face to face patient care time were spent during this visit. All questions were encouraged and answered.  Follow up in a month with me or NP

## 2018-01-10 NOTE — Patient Instructions (Addendum)
WORK ON OTHER MEANS TO HELP TRANSFER AND MOVE "YOUR PATIENT". YOU CAN'T KEEP TRYING TO LIFT AND PULL ON YOUR OWN!!!!   MAKE SURE YOU'RE DOING EXERCISE THAT'S GOOD FOR YOU!!

## 2018-02-01 ENCOUNTER — Ambulatory Visit: Payer: Medicare Other | Admitting: Neurology

## 2018-02-08 ENCOUNTER — Encounter: Payer: Medicare Other | Admitting: Registered Nurse

## 2018-02-10 ENCOUNTER — Encounter: Payer: Self-pay | Admitting: Registered Nurse

## 2018-02-10 ENCOUNTER — Encounter: Payer: Medicare Other | Attending: Physical Medicine & Rehabilitation | Admitting: Registered Nurse

## 2018-02-10 VITALS — BP 111/74 | HR 88 | Ht 65.0 in | Wt 147.0 lb

## 2018-02-10 DIAGNOSIS — Z79891 Long term (current) use of opiate analgesic: Secondary | ICD-10-CM | POA: Diagnosis not present

## 2018-02-10 DIAGNOSIS — Z5181 Encounter for therapeutic drug level monitoring: Secondary | ICD-10-CM

## 2018-02-10 DIAGNOSIS — M4302 Spondylolysis, cervical region: Secondary | ICD-10-CM | POA: Insufficient documentation

## 2018-02-10 DIAGNOSIS — G8929 Other chronic pain: Secondary | ICD-10-CM | POA: Insufficient documentation

## 2018-02-10 DIAGNOSIS — M5412 Radiculopathy, cervical region: Secondary | ICD-10-CM | POA: Diagnosis not present

## 2018-02-10 DIAGNOSIS — M62838 Other muscle spasm: Secondary | ICD-10-CM

## 2018-02-10 DIAGNOSIS — Z9889 Other specified postprocedural states: Secondary | ICD-10-CM | POA: Diagnosis not present

## 2018-02-10 DIAGNOSIS — M961 Postlaminectomy syndrome, not elsewhere classified: Secondary | ICD-10-CM

## 2018-02-10 DIAGNOSIS — G894 Chronic pain syndrome: Secondary | ICD-10-CM

## 2018-02-10 DIAGNOSIS — M7062 Trochanteric bursitis, left hip: Secondary | ICD-10-CM | POA: Diagnosis not present

## 2018-02-10 DIAGNOSIS — M542 Cervicalgia: Secondary | ICD-10-CM | POA: Diagnosis not present

## 2018-02-10 DIAGNOSIS — M5416 Radiculopathy, lumbar region: Secondary | ICD-10-CM

## 2018-02-10 DIAGNOSIS — M47816 Spondylosis without myelopathy or radiculopathy, lumbar region: Secondary | ICD-10-CM

## 2018-02-10 MED ORDER — OXYCODONE HCL 15 MG PO TABS: 15.0000 mg | ORAL_TABLET | Freq: Four times a day (QID) | ORAL | 0 refills | Status: DC | PRN

## 2018-02-10 NOTE — Progress Notes (Signed)
Subjective:    Patient ID: Allison Lee, female    DOB: [birth year], 62 y.o.   MRN: [id number]  HPI: [name] is a 62 year old female who returns for follow up appointment and medication refill. She states her pain is located in her neck radiating into her left shoulder and lower back radiating into her bilateral buttocks R>L and left lower extremity. She rates her pain 8. Her current exercise regime is walking.   S/P ESI with no relief noted.   [name] Morphine Equivalent is 93.00. She is also prescribed Klonopin by [name]. We have discussed the black box warning of using opioids and benzodiazepines.I highlighted the dangers of using these drugs together and discussed the adverse events including respiratory suppression, overdose, cognitive impairment and importance of  compliance with current regimen. She verbalizes understanding, we will continue to monitor and adjust as indicated. She is being closely monitored and under the care of her/him psychiatrist.  [name] last UDS was performed on 12/14/2017, it was consistent.     Pain Inventory Average Pain 7 Pain Right Now 8 My pain is constant, sharp, burning, dull, stabbing, tingling and aching  In the last 24 hours, has pain interfered with the following? General activity 9 Relation with others 9 Enjoyment of life 9 What TIME of day is your pain at its worst? all Sleep (in general) Poor  Pain is worse with: walking, bending, sitting, standing and some activites Pain improves with: rest and medication Relief from Meds: 6  Mobility walk with assistance use a cane how many minutes can you walk? 5 do you drive?  yes transfers alone Do you have any goals in this area?  no  Function disabled: date disabled . Do you have any goals in this area?  no  Neuro/Psych bladder control problems weakness numbness tingling depression anxiety  Prior Studies Any changes since last visit?  no  Physicians  involved in your care Any changes since last visit?  no   Family History  Problem Relation Age of Onset  . COPD Mother   . Cancer Mother   . Dementia Mother   . Cancer Father   . CAD Father 2  . Cancer Brother   . Cirrhosis Brother    Social History   Socioeconomic History  . Marital status: Married    Spouse name: Not on file  . Number of children: 2  . Years of education: 17  . Highest education level: High school graduate  Occupational History  . Occupation: DISABLED    Employer: UNEMPLOYED  Social Needs  . Financial resource strain: Not on file  . Food insecurity:    Worry: Not on file    Inability: Not on file  . Transportation needs:    Medical: Not on file    Non-medical: Not on file  Tobacco Use  . Smoking status: Former Smoker    Packs/day: 1.00    Years: 35.00    Pack years: 35.00    Types: Cigarettes    Last attempt to quit: 08/31/2017    Years since quitting: 0.4  . Smokeless tobacco: Never Used  . Tobacco comment: trying to cut back  Substance and Sexual Activity  . Alcohol use: No  . Drug use: No  . Sexual activity: Not on file  Lifestyle  . Physical activity:    Days per week: Not on file    Minutes per session: Not on file  . Stress: Not on  file  Relationships  . Social connections:    Talks on phone: Not on file    Gets together: Not on file    Attends religious service: Not on file    Active member of club or organization: Not on file    Attends meetings of clubs or organizations: Not on file    Relationship status: Not on file  Other Topics Concern  . Not on file  Social History Narrative   Lives at home with husband.  They have 2 children.  She is currently disabled because of history of ischemic colitis and COPD.  She does band stretching exercises, but no real active walking type exercise.      She quit smoking on October 30 after getting home from her COPD exacerbation.   Past Surgical History:  Procedure Laterality Date  .  CERVICAL SPINE SURGERY     x 2  . FOOT SURGERY  2009   Past Medical History:  Diagnosis Date  . Arthritis   . COPD (chronic obstructive pulmonary disease) ([place])   . Dysthymia   . GERD (gastroesophageal reflux disease)   . History of ischemic colitis   . History of pulmonary embolism 2015   1 year of Xarelto  . IBS (irritable bowel syndrome)   . Osteoporosis   . Other chronic pain   . Smoker    BP 111/74 (BP Location: Right Arm, Patient Position: Sitting, Cuff Size: Normal)   Pulse 88   Ht 5\' 5"  (1.651 m)   Wt 147 lb (66.7 kg)   SpO2 98%   BMI 24.46 kg/m   Opioid Risk Score:   Fall Risk Score:  `1  Depression screen PHQ 2/9  Depression screen [facility] 2/9 12/14/2017 11/16/2017 09/21/2017 08/24/2017 02/08/2017 07/21/2016 06/23/2016  Decreased Interest 3 1 1 1 3  - 3  Down, Depressed, Hopeless 3 1 1 1 3  - 3  PHQ - 2 Score 6 2 2 2 6  - 6  Altered sleeping - - - - - 3 3  Tired, decreased energy - - - - - 3 3  Change in appetite - - - - - - 0  Feeling bad or failure about yourself  - - - - - - 2  Trouble concentrating - - - - - - 2  Moving slowly or fidgety/restless - - - - - - 2  Suicidal thoughts - - - - - - 0  PHQ-9 Score - - - - - - 18  Difficult doing work/chores - - - - - - Very difficult    Review of Systems  Constitutional: Positive for chills and fever.  HENT: Negative.   Eyes: Negative.   Respiratory: Positive for shortness of breath.   Cardiovascular: Positive for leg swelling.  Gastrointestinal: Positive for diarrhea, nausea and vomiting.  Endocrine: Negative.   Genitourinary: Positive for difficulty urinating.  Musculoskeletal: Positive for arthralgias, back pain, gait problem, myalgias and neck pain.  Skin: Negative.   Allergic/Immunologic: Negative.   Neurological: Positive for weakness and numbness.       Tingling   Hematological: Negative.   Psychiatric/Behavioral: Positive for dysphoric mood. The patient is nervous/anxious.   All other systems reviewed  and are negative.      Objective:   Physical Exam  Constitutional: She is oriented to person, place, and time. She appears well-developed and well-nourished.  HENT:  Head: Normocephalic and atraumatic.  Neck: Normal range of motion. Neck supple.  Cardiovascular: Normal rate and regular rhythm.  Pulmonary/Chest:  Effort normal and breath sounds normal.  Musculoskeletal:  Normal Muscle Bulk and Muscle Testing Reveals: Upper Extremities: Full ROM and Muscle Strength on the Right 5/5 and Left 4/5  Bilateral AC Joint Tenderness Thoracic Paraspinal Tenderness: T-1-T-3 T-7-T-9 Lumbar Paraspinal Tenderness: L-3-L-5 Left Greater Trochanter tenderness Lower Extremities: Full ROM and Muscle Strength 5/5 Left Lower Extremity Flexion Produces Pain into Lumbar and Left Hip  Arises from Table Slowly Antalgic gait    Neurological: She is alert and oriented to person, place, and time.  Skin: Skin is warm and dry.  Psychiatric: She has a normal mood and affect.  Nursing note and vitals reviewed.         Assessment & Plan:  1. 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 Patient s/p cervical spine surgery per Dr Patrice Paradise on Mar 09, 2012. We will continue current medication regime with Zonegran. 02/10/2018 2. Lumbar degenerative disc disease as well as facet arthropathy and chronic low back pain. Continue  Current treatment modality with HEP. 02/10/2018 3. Lumbar Radiculopathy/ Neuropathic left upper extremity and left lower extremity pain: Continue Zonegran. 02/10/2018 4. Left Greater trochantericbursitis: Continue with current treatment modality with Ice Therapy and HEP. 02/10/2018 5. Chronic pain syndrome: Continue with current medication and Treatment regimen. Refilled oxycodone 15mg  one tablet QID as needed #120.  We will continue the opioid monitoring program, this consists  of regular clinic visits, examinations, urine drug screen, pill counts as well as use of [place] Controlled Substance Reporting System.  6. Depression/anxiety-: Psychiatry Following: Continue Klonopin.02/10/2018 7. Muscle Spasm: Continue current medication Regime with Tizanidine. 02/10/2018 8. 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 Financial Hardship/ Freight forwarder Following. 02/10/2018   20 minutes of face to face patient care time was spent during this visit. All questions were encouraged and answered.   F/U in 1 month

## 2018-02-21 ENCOUNTER — Telehealth: Payer: Self-pay | Admitting: Registered Nurse

## 2018-02-21 NOTE — Telephone Encounter (Signed)
Allison Lee left a message for ET she states that flexaril bottle can take 1 x a day per bottle but "sonogram? States 2 x morning 2 x am-- wants to know what to do

## 2018-02-21 NOTE — Telephone Encounter (Signed)
Return  [name] call, she'sno longer prescribe Flexeril,  Her flexeril was discontinued by Dr Naaman Plummer she was  prescribe Tizanidine, she is taking medication as prescribed. Dr Naaman Plummer increase her Zonegran to 200 mg in the morning and HS, she reports she was only taking 100 mg in the morning 200 mg at HS. She will increase her Zonegran as Dr. Naaman Plummer ordered, she will call office at the end of the week to evaluate medication changes. Also instructed if she develops daytime drowsiness to resume 100 mg in the morning and 200 mg at HS, she verbalizes understanding.

## 2018-03-04 ENCOUNTER — Other Ambulatory Visit: Payer: Self-pay | Admitting: Family Medicine

## 2018-03-04 DIAGNOSIS — Z1231 Encounter for screening mammogram for malignant neoplasm of breast: Secondary | ICD-10-CM

## 2018-03-08 ENCOUNTER — Encounter: Payer: Self-pay | Admitting: Registered Nurse

## 2018-03-08 ENCOUNTER — Encounter: Payer: Medicare Other | Attending: Physical Medicine & Rehabilitation | Admitting: Registered Nurse

## 2018-03-08 VITALS — BP 106/74 | HR 101 | Resp 14 | Ht 64.0 in | Wt 150.0 lb

## 2018-03-08 DIAGNOSIS — M4302 Spondylolysis, cervical region: Secondary | ICD-10-CM | POA: Diagnosis present

## 2018-03-08 DIAGNOSIS — M961 Postlaminectomy syndrome, not elsewhere classified: Secondary | ICD-10-CM

## 2018-03-08 DIAGNOSIS — Z9889 Other specified postprocedural states: Secondary | ICD-10-CM | POA: Diagnosis not present

## 2018-03-08 DIAGNOSIS — M5412 Radiculopathy, cervical region: Secondary | ICD-10-CM | POA: Diagnosis not present

## 2018-03-08 DIAGNOSIS — M47816 Spondylosis without myelopathy or radiculopathy, lumbar region: Secondary | ICD-10-CM

## 2018-03-08 DIAGNOSIS — Z5181 Encounter for therapeutic drug level monitoring: Secondary | ICD-10-CM | POA: Diagnosis not present

## 2018-03-08 DIAGNOSIS — R296 Repeated falls: Secondary | ICD-10-CM | POA: Diagnosis not present

## 2018-03-08 DIAGNOSIS — M542 Cervicalgia: Secondary | ICD-10-CM | POA: Diagnosis not present

## 2018-03-08 DIAGNOSIS — M62838 Other muscle spasm: Secondary | ICD-10-CM

## 2018-03-08 DIAGNOSIS — G8929 Other chronic pain: Secondary | ICD-10-CM | POA: Diagnosis not present

## 2018-03-08 DIAGNOSIS — M5416 Radiculopathy, lumbar region: Secondary | ICD-10-CM

## 2018-03-08 DIAGNOSIS — G894 Chronic pain syndrome: Secondary | ICD-10-CM | POA: Diagnosis not present

## 2018-03-08 DIAGNOSIS — Z79891 Long term (current) use of opiate analgesic: Secondary | ICD-10-CM | POA: Diagnosis not present

## 2018-03-08 DIAGNOSIS — M7062 Trochanteric bursitis, left hip: Secondary | ICD-10-CM

## 2018-03-08 MED ORDER — OXYCODONE HCL 15 MG PO TABS: 15.0000 mg | ORAL_TABLET | Freq: Four times a day (QID) | ORAL | 0 refills | Status: DC | PRN

## 2018-03-08 NOTE — Patient Instructions (Signed)
[facility] : Dr Delice Lesch : [phone]

## 2018-03-08 NOTE — Progress Notes (Signed)
Subjective:    Patient ID: Allison Lee, female    DOB: 30-Jan-1956, 62 y.o.   MRN: [id number]  HPI: Allison Lee is a 62 year old female who returns for follow up appointment for chronic pain and medication refill. She states her pain is located in her neck radiating into her left shoulder, lower back radiating into her buttocks and left hip. Also states she has had 6 falls since last month, reports she's walking in her home and her left leg gives out. She's falling forward. A neurology referral was placed and she cancelled the appointment. She states she will call Dr. Delice Lesch office for an appointment also will be calling her surgeon Dr. Carloyn Manner to obtain a schedule appointment.  Allison Lee reports her back pain has increased in intensity with the falls, and admits she had taken an extra  Oxycodone about 4 days out of the month, she didn't call the office. We reviewed the narcotic policy, she was instructed to call office with any changes and not to self-medicate. Also realizes this can lead to being discharge from our office. She verbalizes understanding.   Allison Lee Morphine Equivalent is 87.00 MME. She is also prescribe Clonazepam by [employer] We have discussed the black box warning of using opioids and benzodiazepines.  highlighted the dangers of using these drugs together and discussed the adverse events including respiratory suppression, overdose, cognitive impairment and importance of compliance with current regimen. We will continue to monitor and adjust as indicated. She is being closely monitored and under the care of her psychiatrist Dr. Ebony Hail at [facility].   Allison Lee last UDS was Performed on 12/14/2017, it was consistent.   Pain Inventory Average Pain 8 Pain Right Now 8 My pain is constant, sharp, burning, dull, stabbing, tingling and aching  In the last 24 hours, has pain interfered with the following? General activity 10 Relation with others  10 Enjoyment of life 9 What TIME of day is your pain at its worst? all Sleep (in general) Poor  Pain is worse with: walking, bending, sitting, inactivity, standing and some activites Pain improves with: rest and medication Relief from Meds: 5  Mobility walk with assistance use a cane how many minutes can you walk? 3-5 ability to climb steps?  yes do you drive?  yes Do you have any goals in this area?  no  Function disabled: date disabled . I need assistance with the following:  dressing, bathing, household duties and shopping Do you have any goals in this area?  no  Neuro/Psych bladder control problems bowel control problems weakness numbness tingling trouble walking spasms depression anxiety  Prior Studies Any changes since last visit?  no  Physicians involved in your care Any changes since last visit?  no   Family History  Problem Relation Age of Onset  . COPD Mother   . Cancer Mother   . Dementia Mother   . Cancer Father   . CAD Father 45  . Cancer Brother   . Cirrhosis Brother    Social History   Socioeconomic History  . Marital status: Married    Spouse name: Not on file  . Number of children: 2  . Years of education: 93  . Highest education level: High school graduate  Occupational History  . Occupation: DISABLED    Employer: UNEMPLOYED  Social Needs  . Financial resource strain: Not on file  . Food insecurity:    Worry: Not on file  Inability: Not on file  . Transportation needs:    Medical: Not on file    Non-medical: Not on file  Tobacco Use  . Smoking status: Former Smoker    Packs/day: 1.00    Years: 35.00    Pack years: 35.00    Types: Cigarettes    Last attempt to quit: 08/31/2017    Years since quitting: 0.5  . Smokeless tobacco: Never Used  . Tobacco comment: trying to cut back  Substance and Sexual Activity  . Alcohol use: No  . Drug use: No  . Sexual activity: Not on file  Lifestyle  . Physical activity:    Days  per week: Not on file    Minutes per session: Not on file  . Stress: Not on file  Relationships  . Social connections:    Talks on phone: Not on file    Gets together: Not on file    Attends religious service: Not on file    Active member of club or organization: Not on file    Attends meetings of clubs or organizations: Not on file    Relationship status: Not on file  Other Topics Concern  . Not on file  Social History Narrative   Lives at home with husband.  They have 2 children.  She is currently disabled because of history of ischemic colitis and COPD.  She does band stretching exercises, but no real active walking type exercise.      She quit smoking on October 30 after getting home from her COPD exacerbation.   Past Surgical History:  Procedure Laterality Date  . CERVICAL SPINE SURGERY     x 2  . FOOT SURGERY  2009   Past Medical History:  Diagnosis Date  . Arthritis   . COPD (chronic obstructive pulmonary disease) ([place])   . Dysthymia   . GERD (gastroesophageal reflux disease)   . History of ischemic colitis   . History of pulmonary embolism 2015   1 year of Xarelto  . IBS (irritable bowel syndrome)   . Osteoporosis   . Other chronic pain   . Smoker    BP 106/74 (BP Location: Right Arm, Patient Position: Sitting, Cuff Size: Normal)   Pulse (!) 101   Resp 14   Ht 5\' 4"  (1.626 m)   Wt 150 lb (68 kg)   SpO2 98%   BMI 25.75 kg/m   Opioid Risk Score:   Fall Risk Score:  `1  Depression screen PHQ 2/9  Depression screen [facility] 2/9 12/14/2017 11/16/2017 09/21/2017 08/24/2017 02/08/2017 07/21/2016 06/23/2016  Decreased Interest 3 1 1 1 3  - 3  Down, Depressed, Hopeless 3 1 1 1 3  - 3  PHQ - 2 Score 6 2 2 2 6  - 6  Altered sleeping - - - - - 3 3  Tired, decreased energy - - - - - 3 3  Change in appetite - - - - - - 0  Feeling bad or failure about yourself  - - - - - - 2  Trouble concentrating - - - - - - 2  Moving slowly or fidgety/restless - - - - - - 2  Suicidal  thoughts - - - - - - 0  PHQ-9 Score - - - - - - 18  Difficult doing work/chores - - - - - - Very difficult    Review of Systems  Constitutional: Negative.   HENT: Negative.   Eyes: Negative.   Respiratory: Positive for shortness of  breath.   Cardiovascular: Negative.   Gastrointestinal: Positive for constipation.  Endocrine: Negative.   Genitourinary: Positive for difficulty urinating.  Musculoskeletal: Positive for arthralgias, back pain, gait problem, myalgias, neck pain and neck stiffness.       Spasms   Allergic/Immunologic: Negative.   Neurological: Positive for weakness and numbness.       Tingling  Hematological: Bruises/bleeds easily.  Psychiatric/Behavioral: Positive for dysphoric mood. The patient is nervous/anxious.        Objective:   Physical Exam  Constitutional: She is oriented to person, place, and time. She appears well-developed and well-nourished.  HENT:  Head: Normocephalic and atraumatic.  Neck: Normal range of motion. Neck supple.  Cervical Paraspinal Tenderness: C-5-C-6  Cardiovascular: Normal rate.  Pulmonary/Chest: Effort normal.  Musculoskeletal:  Normal Muscle Bulk and Muscle Testing Reveals: Upper Extremities: Right: Full ROM and Muscle Strength on the Right 5/5 and Left 4/5 Left: AC Joint Tenderness Thoracic Paraspinal Tenderness: T-1-T-3 Mainly left Side Lumbar Hypersensitivity Lower Extremities: Decreased ROM and Muscle Strength 4/5 Left Lower Extremity Flexion Produces Pain into Lower Back, Left Hip and Left Knee Arise from Table slowly using cane for support Antalgic Gait    Neurological: She is alert and oriented to person, place, and time.  Skin: Skin is warm and dry.  Nursing note and vitals reviewed.         Assessment & Plan:  1. 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 Patient s/p cervical spine  surgery per Dr Patrice Paradise on Mar 09, 2012. We will continue current medication regime with Zonegran.03/08/2018 2. Lumbar degenerative disc disease as well as facet arthropathy and chronic low back pain. Continue  Current treatment modality with HEP.03/08/2018 3. Lumbar Radiculopathy/ Neuropathic left upper extremity and left lower extremity pain: Continue Zonegran.03/08/2018 4. Left Greater trochantericbursitis: Continue with current treatment modality with Ice Therapy and HEP.03/08/2018 5. Chronic pain syndrome: Continue with current medication and Treatment regimen. Refilled oxycodone 15mg  one tablet QID as needed #120.  We will continue the opioid monitoring program, this consists of regular clinic visits, examinations, urine drug screen, pill counts as well as use of [place] Controlled Substance Reporting System.  6. Depression/anxiety-: Psychiatry Following: Continue Klonopin.03/08/2018 7. Muscle Spasm: Continue current medication Regime with Tizanidine. 03/08/2018 8. 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 [employer] scientist. 03/08/2018  20 minutes of face to face patient care time was spent during this visit. All questions were encouraged and answered.   F/U in 1 month

## 2018-03-21 ENCOUNTER — Encounter (HOSPITAL_COMMUNITY): Payer: Self-pay | Admitting: Emergency Medicine

## 2018-03-21 ENCOUNTER — Ambulatory Visit (HOSPITAL_COMMUNITY)
Admission: EM | Admit: 2018-03-21 | Discharge: 2018-03-21 | Disposition: A | Discharge status: Home / Self Care | Payer: Medicare Other | Attending: Internal Medicine | Admitting: Internal Medicine

## 2018-03-21 DIAGNOSIS — L03221 Cellulitis of neck: Secondary | ICD-10-CM | POA: Diagnosis not present

## 2018-03-21 DIAGNOSIS — S1086XA Insect bite of other specified part of neck, initial encounter: Secondary | ICD-10-CM

## 2018-03-21 DIAGNOSIS — W57XXXA Bitten or stung by nonvenomous insect and other nonvenomous arthropods, initial encounter: Secondary | ICD-10-CM

## 2018-03-21 MED ORDER — HYDROCORTISONE 2.5 % EX LOTN
TOPICAL_LOTION | Freq: Two times a day (BID) | CUTANEOUS | 0 refills | Status: AC
Start: 2018-03-21 — End: ?

## 2018-03-21 MED ORDER — DOXYCYCLINE HYCLATE 100 MG PO CAPS: 100.0000 mg | ORAL_CAPSULE | Freq: Two times a day (BID) | ORAL | 0 refills | Status: AC

## 2018-03-21 NOTE — ED Triage Notes (Signed)
PT removed a tick from her neck one week ago. Area has been swelling. PT reports chronic pain and cannot be sure if it's gotten worse since bite.

## 2018-03-21 NOTE — ED Provider Notes (Signed)
[facility]    CSN: [id number] Arrival date & time: 03/21/18  1710     History   Chief Complaint Chief Complaint  Patient presents with  . Insect Bite    HPI Allison Lee is a 62 y.o. female.   December presents with complaints of tick bite to neck which she feels she pulled off on 5/11. She is not entirely certain how long it was there for but states she does try to check every day. Was not engorged. Area has since become swollen and itchy. She squeezed it last night and had thin clear drainage. This increased the size of the redness. She applied peroxide. Today the size has somewhat improved in swelling but still itchy, when she scratches it it becomes painful. No fevers, rash or body aches. Chronic pain. Hx of arthritis, COPD, gerd, colitis, PE   ROS per HPI.      Past Medical History:  Diagnosis Date  . Arthritis   . COPD (chronic obstructive pulmonary disease) ([place])   . Dysthymia   . GERD (gastroesophageal reflux disease)   . History of ischemic colitis   . History of pulmonary embolism 2015   1 year of Xarelto  . IBS (irritable bowel syndrome)   . Osteoporosis   . Other chronic pain   . Smoker     Patient Active Problem List   Diagnosis Date Noted  . Leg swelling 12/03/2017  . Coronary artery calcification seen on CAT scan 10/05/2017  . Shortness of breath 10/05/2017  . Bilateral carpal tunnel syndrome 09/30/2016  . Lumbar facet arthropathy 06/23/2016  . Trochanteric bursitis of left hip 06/23/2016  . Cervical post-laminectomy syndrome 06/23/2016  . Chest pain 08/24/2014  . Hypokalemia 08/24/2014  . Medication monitoring encounter 10/21/2012  . Cervicalgia 02/29/2012  . Lumbar pain with radiation down left leg 02/29/2012  . Chronic pain disorder 01/05/2012  . GERD (gastroesophageal reflux disease) 01/04/2012  . Depression 01/04/2012  . COPD (chronic obstructive pulmonary disease) ([place])   . Osteoporosis   . IBS (irritable bowel syndrome)      Past Surgical History:  Procedure Laterality Date  . CERVICAL SPINE SURGERY     x 2  . FOOT SURGERY  2009    OB History   None      Home Medications    Prior to Admission medications   Medication Sig Start Date End Date Taking? Authorizing Provider  Calcium-Vitamin D 500-100 MG-UNIT WAFR Take 1 each by mouth 2 (two) times daily.     [provider]  clonazePAM (KLONOPIN) 1 MG tablet Take 1 mg by mouth 2 (two) times daily.     [provider]  doxycycline (VIBRAMYCIN) 100 MG capsule Take 1 capsule (100 mg total) by mouth 2 (two) times daily for 7 days. 03/21/18 03/28/18  Zigmund Gottron, NP  DULoxetine (CYMBALTA) 30 MG capsule Take 30 mg by mouth 2 (two) times daily.     [provider]  hydrocortisone 2.5 % lotion Apply topically 2 (two) times daily. 03/21/18   Zigmund Gottron, NP  omeprazole (PRILOSEC) 40 MG capsule Take 40 mg by mouth daily.  04/27/17   [provider]  oxyCODONE (ROXICODONE) 15 MG immediate release tablet Take 1 tablet (15 mg total) by mouth 4 (four) times daily as needed. 03/08/18   Bayard Hugger, NP  tiZANidine (ZANAFLEX) 4 MG tablet Take 0.5-1 tablets (2-4 mg total) by mouth every 8 (eight) hours as needed for muscle spasms. 01/10/18  Meredith Staggers, MD  zonisamide (ZONEGRAN) 100 MG capsule Take 1 capsule (100 mg total) by mouth 3 (three) times daily. 1 capsule in morning and 2 capsules at bedtime 01/10/18   Meredith Staggers, MD    Family History Family History  Problem Relation Age of Onset  . COPD Mother   . Cancer Mother   . Dementia Mother   . Cancer Father   . CAD Father 74  . Cancer Brother   . Cirrhosis Brother     Social History Social History   Tobacco Use  . Smoking status: Former Smoker    Packs/day: 1.00    Years: 35.00    Pack years: 35.00    Types: Cigarettes    Last attempt to quit: 08/31/2017    Years since quitting: 0.5  . Smokeless tobacco: Never Used  . Tobacco comment: trying to  cut back  Substance Use Topics  . Alcohol use: No  . Drug use: No     Allergies   Methadone; Hydrocodone; Topamax; Hctz [hydrochlorothiazide]; Neurontin [gabapentin]; and Pregabalin   Review of Systems Review of Systems   Physical Exam Triage Vital Signs ED Triage Vitals [03/21/18 1840]  Enc Vitals Group     BP 116/81     Pulse Rate 75     Resp 16     Temp 98.6 F (37 C)     Temp Source Oral     SpO2 99 %     Weight 150 lb (68 kg)     Height      Head Circumference      Peak Flow      Pain Score 0     Pain Loc      Pain Edu?      Excl. in [place]?    No data found.  Updated Vital Signs BP 116/81   Pulse 75   Temp 98.6 F (37 C) (Oral)   Resp 16   Wt 150 lb (68 kg)   SpO2 99%   BMI 25.75 kg/m    Physical Exam  Constitutional: She is oriented to person, place, and time. She appears well-developed and well-nourished. No distress.  Neck:    Firm red skin tissue with small area of puncture which is open; redness approximately 1.5 cm in diameter   Cardiovascular: Normal rate, regular rhythm and normal heart sounds.  Pulmonary/Chest: Effort normal and breath sounds normal.  Neurological: She is alert and oriented to person, place, and time.  Skin: Skin is warm and dry.     UC Treatments / Results  Labs (all labs ordered are listed, but only abnormal results are displayed) Labs Reviewed - No data to display  EKG None  Radiology No results found.  Procedures Procedures (including critical care time)  Medications Ordered in UC Medications - No data to display  Initial Impression / Assessment and Plan / UC Course  I have reviewed the triage vital signs and the nursing notes.  Pertinent labs & imaging results that were available during my care of the patient were reviewed by me and considered in my medical decision making (see chart for details).     Non toxic in appearance, afebrile. Without rash. Appears to have localized infection related to  tick bite. Low suspicion for RMSF or lyme at this time. Doxy x7 days, warm compresses, hydrocortisone to help with itching. Patient verbalized understanding and agreeable to plan.  Return precautions provided.    Final Clinical Impressions(s) / UC Diagnoses  Final diagnoses:  Insect bite of other part of neck, initial encounter  Cellulitis of neck     Discharge Instructions     Warm compress to the area to promote drainage. Complete course of antibiotics.  May apply the topical lotion twice a day to help with itching. If worsening redness, pain, drainage, fevers, rash or otherwise worsening please return or go to Er.    ED Prescriptions    Medication Sig Dispense Auth. Provider   doxycycline (VIBRAMYCIN) 100 MG capsule Take 1 capsule (100 mg total) by mouth 2 (two) times daily for 7 days. 14 capsule Augusto Gamble B, NP   hydrocortisone 2.5 % lotion Apply topically 2 (two) times daily. 59 mL Augusto Gamble B, NP     Controlled Substance Prescriptions [place] Controlled Substance Registry consulted? Not Applicable   Zigmund Gottron, NP 03/21/18 [phone]

## 2018-03-21 NOTE — Discharge Instructions (Signed)
Warm compress to the area to promote drainage. Complete course of antibiotics.  May apply the topical lotion twice a day to help with itching. If worsening redness, pain, drainage, fevers, rash or otherwise worsening please return or go to Er.

## 2018-04-04 ENCOUNTER — Ambulatory Visit
Admission: RE | Admit: 2018-04-04 | Discharge: 2018-04-04 | Disposition: A | Discharge status: Home / Self Care | Payer: Medicare Other | Source: Ambulatory Visit | Attending: Family Medicine | Admitting: Family Medicine

## 2018-04-04 DIAGNOSIS — Z1231 Encounter for screening mammogram for malignant neoplasm of breast: Secondary | ICD-10-CM

## 2018-04-05 ENCOUNTER — Other Ambulatory Visit: Payer: Self-pay | Admitting: Family Medicine

## 2018-04-05 DIAGNOSIS — R928 Other abnormal and inconclusive findings on diagnostic imaging of breast: Secondary | ICD-10-CM

## 2018-04-06 ENCOUNTER — Telehealth: Payer: Self-pay | Admitting: Registered Nurse

## 2018-04-06 DIAGNOSIS — M7062 Trochanteric bursitis, left hip: Secondary | ICD-10-CM

## 2018-04-06 DIAGNOSIS — M47816 Spondylosis without myelopathy or radiculopathy, lumbar region: Secondary | ICD-10-CM

## 2018-04-06 DIAGNOSIS — M542 Cervicalgia: Secondary | ICD-10-CM

## 2018-04-06 DIAGNOSIS — M961 Postlaminectomy syndrome, not elsewhere classified: Secondary | ICD-10-CM

## 2018-04-06 MED ORDER — OXYCODONE HCL 15 MG PO TABS
15.0000 mg | ORAL_TABLET | Freq: Four times a day (QID) | ORAL | 0 refills | Status: DC | PRN
Start: 2018-04-06 — End: 2018-05-04

## 2018-04-06 NOTE — Telephone Encounter (Signed)
Oxycodone prescription filled. According to the PMP Aware Web-site last prescription picked up on 03/08/2018. Scheduled appointment on 04/11/2018. Placed a call to Ms. Lehrman, she is aware of the above and verbalizes understanding.

## 2018-04-08 ENCOUNTER — Ambulatory Visit
Admission: RE | Admit: 2018-04-08 | Discharge: 2018-04-08 | Disposition: A | Discharge status: Home / Self Care | Payer: Medicare Other | Source: Ambulatory Visit | Attending: Family Medicine | Admitting: Family Medicine

## 2018-04-08 ENCOUNTER — Ambulatory Visit: Payer: Medicare Other

## 2018-04-08 DIAGNOSIS — R928 Other abnormal and inconclusive findings on diagnostic imaging of breast: Secondary | ICD-10-CM

## 2018-04-11 ENCOUNTER — Encounter: Payer: Medicare Other | Attending: Physical Medicine & Rehabilitation | Admitting: Registered Nurse

## 2018-04-11 ENCOUNTER — Encounter: Payer: Self-pay | Admitting: Registered Nurse

## 2018-04-11 VITALS — BP 125/84 | HR 98 | Temp 98.1°F | Ht 65.0 in | Wt 146.0 lb

## 2018-04-11 DIAGNOSIS — M542 Cervicalgia: Secondary | ICD-10-CM | POA: Diagnosis not present

## 2018-04-11 DIAGNOSIS — M961 Postlaminectomy syndrome, not elsewhere classified: Secondary | ICD-10-CM | POA: Diagnosis not present

## 2018-04-11 DIAGNOSIS — M5412 Radiculopathy, cervical region: Secondary | ICD-10-CM | POA: Diagnosis not present

## 2018-04-11 DIAGNOSIS — G8929 Other chronic pain: Secondary | ICD-10-CM | POA: Diagnosis not present

## 2018-04-11 DIAGNOSIS — R296 Repeated falls: Secondary | ICD-10-CM | POA: Diagnosis not present

## 2018-04-11 DIAGNOSIS — M4302 Spondylolysis, cervical region: Secondary | ICD-10-CM | POA: Diagnosis present

## 2018-04-11 DIAGNOSIS — Z5181 Encounter for therapeutic drug level monitoring: Secondary | ICD-10-CM

## 2018-04-11 DIAGNOSIS — G894 Chronic pain syndrome: Secondary | ICD-10-CM

## 2018-04-11 DIAGNOSIS — Z79891 Long term (current) use of opiate analgesic: Secondary | ICD-10-CM

## 2018-04-11 DIAGNOSIS — Z9889 Other specified postprocedural states: Secondary | ICD-10-CM | POA: Insufficient documentation

## 2018-04-11 DIAGNOSIS — M7062 Trochanteric bursitis, left hip: Secondary | ICD-10-CM | POA: Diagnosis not present

## 2018-04-11 DIAGNOSIS — M47816 Spondylosis without myelopathy or radiculopathy, lumbar region: Secondary | ICD-10-CM

## 2018-04-11 DIAGNOSIS — M5416 Radiculopathy, lumbar region: Secondary | ICD-10-CM | POA: Diagnosis not present

## 2018-04-11 DIAGNOSIS — M62838 Other muscle spasm: Secondary | ICD-10-CM

## 2018-04-11 MED ORDER — ZONISAMIDE 100 MG PO CAPS: 100.0000 mg | ORAL_CAPSULE | Freq: Three times a day (TID) | ORAL | 2 refills | Status: AC

## 2018-04-11 NOTE — Progress Notes (Deleted)
Pain Inventory Average Pain 8 Pain Right Now 9 My pain is sharp, burning, dull, stabbing, tingling and aching  In the last 24 hours, has pain interfered with the following? General activity 10 Relation with others 10 Enjoyment of life 10 What TIME of day is your pain at its worst? all day Sleep (in general) Poor  Pain is worse with: walking, bending, sitting, inactivity, standing, unsure and some activites Pain improves with: rest and medication Relief from Meds: 4  Mobility walk with assistance use a cane ability to climb steps?  yes do you drive?  yes  Function disabled: date disabled 2005  Neuro/Psych bladder control problems numbness tingling spasms depression anxiety  Prior Studies bone scan x-rays CT/MRI nerve study injection  Physicians involved in your care Primary care Dr. Laurann Montana,  Psychiatrist Dr. Levora Angel Dr. Wayland Salinas   Family History  Problem Relation Age of Onset  . COPD Mother   . Cancer Mother   . Dementia Mother   . Cancer Father   . CAD Father 26  . Cancer Brother   . Cirrhosis Brother    Social History   Socioeconomic History  . Marital status: Married    Spouse name: Not on file  . Number of children: 2  . Years of education: 65  . Highest education level: High school graduate  Occupational History  . Occupation: DISABLED    Employer: UNEMPLOYED  Social Needs  . Financial resource strain: Not on file  . Food insecurity:    Worry: Not on file    Inability: Not on file  . Transportation needs:    Medical: Not on file    Non-medical: Not on file  Tobacco Use  . Smoking status: Former Smoker    Packs/day: 1.00    Years: 35.00    Pack years: 35.00    Types: Cigarettes    Last attempt to quit: 08/31/2017    Years since quitting: 0.6  . Smokeless tobacco: Never Used  . Tobacco comment: trying to cut back  Substance and Sexual Activity  . Alcohol use: No  . Drug use: No  . Sexual activity: Not on file  Lifestyle  .  Physical activity:    Days per week: Not on file    Minutes per session: Not on file  . Stress: Not on file  Relationships  . Social connections:    Talks on phone: Not on file    Gets together: Not on file    Attends religious service: Not on file    Active member of club or organization: Not on file    Attends meetings of clubs or organizations: Not on file    Relationship status: Not on file  Other Topics Concern  . Not on file  Social History Narrative   Lives at home with husband.  They have 2 children.  She is currently disabled because of history of ischemic colitis and COPD.  She does band stretching exercises, but no real active walking type exercise.      She quit smoking on October 30 after getting home from her COPD exacerbation.   Past Surgical History:  Procedure Laterality Date  . CERVICAL SPINE SURGERY     x 2  . FOOT SURGERY  2009   Past Medical History:  Diagnosis Date  . Arthritis   . COPD (chronic obstructive pulmonary disease) ([place])   . Dysthymia   . GERD (gastroesophageal reflux disease)   . History of ischemic colitis   .  History of pulmonary embolism 2015   1 year of Xarelto  . IBS (irritable bowel syndrome)   . Osteoporosis   . Other chronic pain   . Smoker    Ht 5\' 5"  (1.651 m)   Wt 146 lb (66.2 kg)   BMI 24.30 kg/m   Opioid Risk Score:   Fall Risk Score:  `1  Depression screen PHQ 2/9  Depression screen [facility] 2/9 12/14/2017 11/16/2017 09/21/2017 08/24/2017 02/08/2017 07/21/2016 06/23/2016  Decreased Interest 3 1 1 1 3  - 3  Down, Depressed, Hopeless 3 1 1 1 3  - 3  PHQ - 2 Score 6 2 2 2 6  - 6  Altered sleeping - - - - - 3 3  Tired, decreased energy - - - - - 3 3  Change in appetite - - - - - - 0  Feeling bad or failure about yourself  - - - - - - 2  Trouble concentrating - - - - - - 2  Moving slowly or fidgety/restless - - - - - - 2  Suicidal thoughts - - - - - - 0  PHQ-9 Score - - - - - - 18  Difficult doing work/chores - - - - - - Very  difficult

## 2018-04-11 NOTE — Progress Notes (Signed)
Subjective:    Patient ID: Allison Lee, female    DOB: 06/24/1956, 62 y.o.   MRN: [id number]  HPI: [name] is a 62 year old female who returns for follow up appointment for chronic pain and medication refill. She states her pain is located in her neck radiating into her left arm with tingling and burning, lower back pain radiating into her buttocks and bilateral lower extremities L>R. She rates her pa9. Her current exercise regime is walking, she is using her cane at all times. Also reports she fell last week, she was walking and her right lower extremity gave out. She was instructed to use her walker at all times. She has an appointment with Dr. Vertell Limber on 05/04/2018 and the Neurologist on 05/25/2018. Educated on Falls prevention she verbalizes understanding.   [name] Morphine equivalent is 90.00 MME. She is also prescribed Clonazepam  by Alana Pomarico.We have discussed the black box warning of using opioids and benzodiazepines. I highlighted the dangers of using these drugs together and discussed the adverse events including respiratory suppression, overdose, cognitive impairment and importance of compliance with current regimen. We will continue to monitor and adjust as indicated.  She is being closely monitored and under the care of her psychiatrist Dr. Ebony Hail at [facility].    Last UDS was Performed on 12/14/2017, it was consistent.   Pain Inventory Average Pain 8 Pain Right Now 9 My pain is sharp, burning, dull, stabbing, tingling and aching  In the last 24 hours, has pain interfered with the following? General activity 10 Relation with others 10 Enjoyment of life 10 What TIME of day is your pain at its worst? all day Sleep (in general) Poor  Pain is worse with: walking, bending, sitting, inactivity, standing, unsure and some activites Pain improves with: rest and medication Relief from Meds: 4  Mobility walk with assistance use a cane ability to  climb steps?  yes do you drive?  yes  Function disabled: date disabled 2005  Neuro/Psych bladder control problems weakness numbness tingling depression anxiety  Prior Studies bone scan x-rays CT/MRI nerve study injection  Physicians involved in your care    Family History  Problem Relation Age of Onset  . COPD Mother   . Cancer Mother   . Dementia Mother   . Cancer Father   . CAD Father 92  . Cancer Brother   . Cirrhosis Brother    Social History   Socioeconomic History  . Marital status: Married    Spouse name: Not on file  . Number of children: 2  . Years of education: 23  . Highest education level: High school graduate  Occupational History  . Occupation: DISABLED    Employer: UNEMPLOYED  Social Needs  . Financial resource strain: Not on file  . Food insecurity:    Worry: Not on file    Inability: Not on file  . Transportation needs:    Medical: Not on file    Non-medical: Not on file  Tobacco Use  . Smoking status: Former Smoker    Packs/day: 1.00    Years: 35.00    Pack years: 35.00    Types: Cigarettes    Last attempt to quit: 08/31/2017    Years since quitting: 0.6  . Smokeless tobacco: Never Used  . Tobacco comment: trying to cut back  Substance and Sexual Activity  . Alcohol use: No  . Drug use: No  . Sexual activity: Not on file  Lifestyle  . Physical activity:    Days per week: Not on file    Minutes per session: Not on file  . Stress: Not on file  Relationships  . Social connections:    Talks on phone: Not on file    Gets together: Not on file    Attends religious service: Not on file    Active member of club or organization: Not on file    Attends meetings of clubs or organizations: Not on file    Relationship status: Not on file  Other Topics Concern  . Not on file  Social History Narrative   Lives at home with husband.  They have 2 children.  She is currently disabled because of history of ischemic colitis and COPD.   She does band stretching exercises, but no real active walking type exercise.      She quit smoking on October 30 after getting home from her COPD exacerbation.   Past Surgical History:  Procedure Laterality Date  . CERVICAL SPINE SURGERY     x 2  . FOOT SURGERY  2009   Past Medical History:  Diagnosis Date  . Arthritis   . COPD (chronic obstructive pulmonary disease) ([place])   . Dysthymia   . GERD (gastroesophageal reflux disease)   . History of ischemic colitis   . History of pulmonary embolism 2015   1 year of Xarelto  . IBS (irritable bowel syndrome)   . Osteoporosis   . Other chronic pain   . Smoker    Ht 5\' 5"  (1.651 m)   Wt 146 lb (66.2 kg)   BMI 24.30 kg/m   Opioid Risk Score:   Fall Risk Score:  `1  Depression screen PHQ 2/9  Depression screen [facility] 2/9 12/14/2017 11/16/2017 09/21/2017 08/24/2017 02/08/2017 07/21/2016 06/23/2016  Decreased Interest 3 1 1 1 3  - 3  Down, Depressed, Hopeless 3 1 1 1 3  - 3  PHQ - 2 Score 6 2 2 2 6  - 6  Altered sleeping - - - - - 3 3  Tired, decreased energy - - - - - 3 3  Change in appetite - - - - - - 0  Feeling bad or failure about yourself  - - - - - - 2  Trouble concentrating - - - - - - 2  Moving slowly or fidgety/restless - - - - - - 2  Suicidal thoughts - - - - - - 0  PHQ-9 Score - - - - - - 18  Difficult doing work/chores - - - - - - Very difficult     Review of Systems  Constitutional:       Easily bleeding  Respiratory: Positive for shortness of breath and wheezing.   Cardiovascular:       Limb swelling  Gastrointestinal: Positive for constipation.  All other systems reviewed and are negative.      Objective:   Physical Exam  Constitutional: She is oriented to person, place, and time. She appears well-developed and well-nourished.  HENT:  Head: Normocephalic and atraumatic.  Neck: Normal range of motion. Neck supple.  Cervical Paraspinal Tenderness: C-5-C-6  Cardiovascular: Normal rate and regular rhythm.    Pulmonary/Chest: Effort normal and breath sounds normal.  Musculoskeletal:  Normal Muscle Bulk and Muscle Testing Reveals: Upper Extremities: Full ROM and Muscle Strength on Right 5/5 and Left 4/5 Thoracic Paraspinal Tenderness: T-1-T-3  T-7-T-9 Hypersensitivity Lumbar Paraspinal Tenderness: L-3-L-5 Lower Extremities: Full ROM and Muscle Strength 5/5 Left Lower  Extremity Flexion Produces Pain into Lumbar and Left Hip Arises from Table Slowly using cane for support Antalgic gait    Neurological: She is alert and oriented to person, place, and time.  Skin: Skin is warm.  Psychiatric: She has a normal mood and affect.  Nursing note and vitals reviewed.         Assessment & Plan:  1. 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 Patient s/p cervical spine surgery per Dr Patrice Paradise on Mar 09, 2012.We will continue current medication regime withZonegran.04/11/2018 2. Lumbar degenerative disc disease as well as facet arthropathy and chronic low back pain. ContinueCurrent treatment modality withHEP.04/11/2018 3. Lumbar Radiculopathy/ Neuropathic left upper extremity and left lower extremity pain: Continue Zonegran.04/11/2018 4. Left Greater trochantericbursitis: Continuewith current treatment modality withIce Therapy and HEP.04/11/2018 5. Chronic pain syndrome: Continue with current medication and Treatment regimen.Refilled oxycodone 15mg  one tablet QID as needed #120.  We will continue the opioid monitoring program, this consists of regular clinic visits, examinations, urine drug screen, pill counts as well as use of [place] Controlled Substance Reporting System.  6. Depression/anxiety-: Psychiatry Following: Continue Klonopin.04/11/2018 7. Muscle Spasm: Continuecurrent medication Regime with Tizanidine. 04/11/2018 8. Opioid Induced Constipation:  Continue to Monitor. 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 04/11/2018 9. Frequent Falls: She has a scheduled appointment with Neurologist and Dr. Vertell Limber.  20 minutes of face to face patient care time was spent during this visit. All questions were encouraged and answered.   F/U in 1 month

## 2018-05-04 ENCOUNTER — Encounter: Payer: Medicare Other | Attending: Physical Medicine & Rehabilitation | Admitting: Registered Nurse

## 2018-05-04 ENCOUNTER — Encounter: Payer: Self-pay | Admitting: Registered Nurse

## 2018-05-04 ENCOUNTER — Other Ambulatory Visit: Payer: Self-pay

## 2018-05-04 VITALS — BP 127/81 | HR 83 | Ht 65.0 in | Wt 144.0 lb

## 2018-05-04 DIAGNOSIS — M542 Cervicalgia: Secondary | ICD-10-CM | POA: Diagnosis not present

## 2018-05-04 DIAGNOSIS — M47816 Spondylosis without myelopathy or radiculopathy, lumbar region: Secondary | ICD-10-CM

## 2018-05-04 DIAGNOSIS — Z5181 Encounter for therapeutic drug level monitoring: Secondary | ICD-10-CM | POA: Diagnosis not present

## 2018-05-04 DIAGNOSIS — M7062 Trochanteric bursitis, left hip: Secondary | ICD-10-CM

## 2018-05-04 DIAGNOSIS — M62838 Other muscle spasm: Secondary | ICD-10-CM

## 2018-05-04 DIAGNOSIS — Z79891 Long term (current) use of opiate analgesic: Secondary | ICD-10-CM | POA: Diagnosis not present

## 2018-05-04 DIAGNOSIS — G894 Chronic pain syndrome: Secondary | ICD-10-CM

## 2018-05-04 DIAGNOSIS — M4302 Spondylolysis, cervical region: Secondary | ICD-10-CM | POA: Diagnosis not present

## 2018-05-04 DIAGNOSIS — M5416 Radiculopathy, lumbar region: Secondary | ICD-10-CM | POA: Diagnosis not present

## 2018-05-04 DIAGNOSIS — M961 Postlaminectomy syndrome, not elsewhere classified: Secondary | ICD-10-CM

## 2018-05-04 DIAGNOSIS — M5412 Radiculopathy, cervical region: Secondary | ICD-10-CM | POA: Diagnosis not present

## 2018-05-04 DIAGNOSIS — G8929 Other chronic pain: Secondary | ICD-10-CM | POA: Diagnosis not present

## 2018-05-04 DIAGNOSIS — Z9889 Other specified postprocedural states: Secondary | ICD-10-CM | POA: Insufficient documentation

## 2018-05-04 MED ORDER — OXYCODONE HCL 15 MG PO TABS: 15.0000 mg | ORAL_TABLET | Freq: Four times a day (QID) | ORAL | 0 refills | Status: DC | PRN

## 2018-05-04 NOTE — Progress Notes (Signed)
Subjective:    Patient ID: Allison Lee, female    DOB: Jul 25, 1956, 62 y.o.   MRN: [id number]  HPI: Allison Lee is a 62 year old female who returns for follow up appointment for chronic pain and medication refill. She states her pain is located in her neck radiating into her left shoulder, lower back radiating into her buttocks and left lower extremity and left hip. She rates her pain 9. Also reports her lower back pain has increased in intensity, she is scheduled to see Dr. Vertell Limber this morning.   Allison Lee Morphine Equivalent is 90.00 MME. She is also prescribed Clonazepam  by Alana Pomarico.We have discussed the black box warning of using opioids and benzodiazepines. I highlighted the dangers of using these drugs together and discussed the adverse events including respiratory suppression, overdose, cognitive impairment and importance of compliance with current regimen. We will continue to monitor and adjust as indicated.  She is being closely monitored and under the care of her psychiatrist Dr. Ebony Hail at [facility].    Last UDS was performed on 12/14/17 it was consistent.   Pain Inventory Average Pain 7 Pain Right Now 9 My pain is constant, sharp, burning, dull, stabbing, tingling and aching  In the last 24 hours, has pain interfered with the following? General activity 10 Relation with others 10 Enjoyment of life 10 What TIME of day is your pain at its worst? all Sleep (in general) Poor  Pain is worse with: walking, bending, sitting and standing Pain improves with: rest and medication Relief from Meds: 4  Mobility walk with assistance use a cane use a walker ability to climb steps?  yes do you drive?  yes  Function disabled: date disabled 05/2004 I need assistance with the following:  dressing, meal prep, household duties and shopping  Neuro/Psych bladder control problems weakness numbness tingling spasms depression anxiety  Prior  Studies Any changes since last visit?  no  Physicians involved in your care Any changes since last visit?  no   Family History  Problem Relation Age of Onset  . COPD Mother   . Cancer Mother   . Dementia Mother   . Cancer Father   . CAD Father 26  . Cancer Brother   . Cirrhosis Brother    Social History   Socioeconomic History  . Marital status: Married    Spouse name: Not on file  . Number of children: 2  . Years of education: 75  . Highest education level: High school graduate  Occupational History  . Occupation: DISABLED    Employer: UNEMPLOYED  Social Needs  . Financial resource strain: Not on file  . Food insecurity:    Worry: Not on file    Inability: Not on file  . Transportation needs:    Medical: Not on file    Non-medical: Not on file  Tobacco Use  . Smoking status: Former Smoker    Packs/day: 1.00    Years: 35.00    Pack years: 35.00    Types: Cigarettes    Last attempt to quit: 08/31/2017    Years since quitting: 0.6  . Smokeless tobacco: Never Used  . Tobacco comment: trying to cut back  Substance and Sexual Activity  . Alcohol use: No  . Drug use: No  . Sexual activity: Not on file  Lifestyle  . Physical activity:    Days per week: Not on file    Minutes per session: Not on file  .  Stress: Not on file  Relationships  . Social connections:    Talks on phone: Not on file    Gets together: Not on file    Attends religious service: Not on file    Active member of club or organization: Not on file    Attends meetings of clubs or organizations: Not on file    Relationship status: Not on file  Other Topics Concern  . Not on file  Social History Narrative   Lives at home with husband.  They have 2 children.  She is currently disabled because of history of ischemic colitis and COPD.  She does band stretching exercises, but no real active walking type exercise.      She quit smoking on October 30 after getting home from her COPD exacerbation.    Past Surgical History:  Procedure Laterality Date  . CERVICAL SPINE SURGERY     x 2  . FOOT SURGERY  2009   Past Medical History:  Diagnosis Date  . Arthritis   . COPD (chronic obstructive pulmonary disease) ([place])   . Dysthymia   . GERD (gastroesophageal reflux disease)   . History of ischemic colitis   . History of pulmonary embolism 2015   1 year of Xarelto  . IBS (irritable bowel syndrome)   . Osteoporosis   . Other chronic pain   . Smoker    BP 127/81   Pulse 83   Ht 5\' 5"  (1.651 m)   Wt 144 lb (65.3 kg)   SpO2 100%   BMI 23.96 kg/m   Opioid Risk Score:   Fall Risk Score:  `1  Depression screen PHQ 2/9  Depression screen [facility] 2/9 05/04/2018 12/14/2017 11/16/2017 09/21/2017 08/24/2017 02/08/2017 07/21/2016  Decreased Interest 1 3 1 1 1 3  -  Down, Depressed, Hopeless 1 3 1 1 1 3  -  PHQ - 2 Score 2 6 2 2 2 6  -  Altered sleeping - - - - - - 3  Tired, decreased energy - - - - - - 3  Change in appetite - - - - - - -  Feeling bad or failure about yourself  - - - - - - -  Trouble concentrating - - - - - - -  Moving slowly or fidgety/restless - - - - - - -  Suicidal thoughts - - - - - - -  PHQ-9 Score - - - - - - -  Difficult doing work/chores - - - - - - -    Review of Systems  Constitutional: Positive for unexpected weight change.  HENT: Negative.   Eyes: Negative.   Respiratory:       Limb swelling  Cardiovascular: Negative.   Gastrointestinal: Positive for constipation.  Endocrine: Negative.   Genitourinary: Negative.   Musculoskeletal: Negative.   Skin: Negative.   Allergic/Immunologic: Negative.   Neurological: Negative.   Hematological: Negative.   Psychiatric/Behavioral: Negative.   All other systems reviewed and are negative.      Objective:   Physical Exam  Constitutional: She is oriented to person, place, and time. She appears well-developed and well-nourished.  HENT:  Head: Normocephalic and atraumatic.  Neck: Normal range of motion. Neck  supple.  Cervical Paraspinal Tenderness: C-5-C-6  Cardiovascular: Normal rate and regular rhythm.  Pulmonary/Chest: Effort normal and breath sounds normal.  Musculoskeletal:  Normal Muscle Bulk and Muscle Testing Reveals: Upper Extremities: Full ROM and Muscle Strength 5/5  And Left 4/5 Left AC Joint Tenderness Thoracic Paraspinal Tenderness:  T-1-T-3 Mainly Left  Lumbar Paraspinal Tenderness: L-4-L-5 Left Greater Trochanter Tenderness Lower Extremities: Full ROM and Muscle Strength 5/5 Left Lower extremity Flexion Produces Pain into Lower Extremity Arises from Table Slowly using cane for support Antalgic Gait  Neurological: She is alert and oriented to person, place, and time.  Skin: Skin is warm and dry.  Psychiatric: She has a normal mood and affect. Her behavior is normal.  Nursing note and vitals reviewed.         Assessment & Plan:  1. 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 Patient s/p cervical spine surgery per Dr Patrice Paradise on Mar 09, 2012.We will continue current medication regime withZonegran.05/04/2018 2. Lumbar degenerative disc disease as well as facet arthropathy and chronic low back pain. ContinueCurrent treatment modality withHEP.05/04/2018 3. Lumbar Radiculopathy/ Neuropathic left upper extremity and left lower extremity pain: Continue Zonegran.05/04/2018 4. Left Greater trochantericbursitis: Continuewith current treatment modality withIce Therapy and HEP.05/04/2018 5. Chronic pain syndrome: Continue with current medication and Treatment regimen.Refilled oxycodone 15mg  one tablet QID as needed #120.  We will continue the opioid monitoring program, this consists of regular clinic visits, examinations, urine drug screen, pill counts as well as use of [place] Controlled Substance Reporting System.  6. Depression/anxiety-:  Psychiatry Following: Continue Klonopin.05/04/2018 7. Muscle Spasm: Continuecurrent medication Regime with Tizanidine. 05/04/2018 8. 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 Financial Soil scientist. 05/04/2018 9. Frequent Falls: No Falls for the last 4 weeks  She has a scheduled appointment with Neurologist and Dr. Vertell Limber.  20 minutes of face to face patient care time was spent during this visit. All questions were encouraged and answered.   F/U in 1 month

## 2018-05-16 ENCOUNTER — Telehealth: Payer: Self-pay | Admitting: Physical Medicine & Rehabilitation

## 2018-05-16 NOTE — Telephone Encounter (Signed)
Per faxed document from [place] neuro surg Dr. Vertell Limber need to schedul lumbar spine - transfor left L5-S1  Diag Lumb Radiculopathy M 54.16 with [place] -- left message at home

## 2018-05-17 ENCOUNTER — Other Ambulatory Visit: Payer: Self-pay

## 2018-05-18 ENCOUNTER — Other Ambulatory Visit: Payer: Self-pay | Admitting: Physical Medicine & Rehabilitation

## 2018-05-18 DIAGNOSIS — M47816 Spondylosis without myelopathy or radiculopathy, lumbar region: Secondary | ICD-10-CM

## 2018-05-18 DIAGNOSIS — M5416 Radiculopathy, lumbar region: Secondary | ICD-10-CM

## 2018-05-25 ENCOUNTER — Ambulatory Visit: Payer: Medicare Other | Admitting: Neurology

## 2018-06-03 ENCOUNTER — Encounter: Payer: Self-pay | Admitting: Registered Nurse

## 2018-06-03 ENCOUNTER — Encounter: Payer: Medicare Other | Attending: Physical Medicine & Rehabilitation | Admitting: Registered Nurse

## 2018-06-03 VITALS — BP 90/59 | HR 76 | Resp 16 | Ht 65.0 in | Wt 145.0 lb

## 2018-06-03 DIAGNOSIS — M961 Postlaminectomy syndrome, not elsewhere classified: Secondary | ICD-10-CM | POA: Diagnosis not present

## 2018-06-03 DIAGNOSIS — G8929 Other chronic pain: Secondary | ICD-10-CM | POA: Diagnosis not present

## 2018-06-03 DIAGNOSIS — Z5181 Encounter for therapeutic drug level monitoring: Secondary | ICD-10-CM

## 2018-06-03 DIAGNOSIS — M4302 Spondylolysis, cervical region: Secondary | ICD-10-CM | POA: Diagnosis present

## 2018-06-03 DIAGNOSIS — M5412 Radiculopathy, cervical region: Secondary | ICD-10-CM

## 2018-06-03 DIAGNOSIS — M5416 Radiculopathy, lumbar region: Secondary | ICD-10-CM

## 2018-06-03 DIAGNOSIS — M47816 Spondylosis without myelopathy or radiculopathy, lumbar region: Secondary | ICD-10-CM

## 2018-06-03 DIAGNOSIS — Z9889 Other specified postprocedural states: Secondary | ICD-10-CM | POA: Insufficient documentation

## 2018-06-03 DIAGNOSIS — M542 Cervicalgia: Secondary | ICD-10-CM

## 2018-06-03 DIAGNOSIS — M62838 Other muscle spasm: Secondary | ICD-10-CM

## 2018-06-03 DIAGNOSIS — G894 Chronic pain syndrome: Secondary | ICD-10-CM

## 2018-06-03 DIAGNOSIS — M7062 Trochanteric bursitis, left hip: Secondary | ICD-10-CM

## 2018-06-03 DIAGNOSIS — Z79891 Long term (current) use of opiate analgesic: Secondary | ICD-10-CM | POA: Diagnosis not present

## 2018-06-03 MED ORDER — OXYCODONE HCL 15 MG PO TABS: 15.0000 mg | ORAL_TABLET | Freq: Four times a day (QID) | ORAL | 0 refills | Status: DC | PRN

## 2018-06-03 NOTE — Progress Notes (Signed)
Subjective:    Patient ID: Allison Lee, female    DOB: Jun 07, 1956, 62 y.o.   MRN: [id number]  HPI: Ms. Allison TODOROV is a 62 year old female who returns for follow up appointment for chronic pain and medication refill. She states her pain is located in her neck radiating into her left arm with tingling also radiating into her left thumb and index finger, l also reports lower back pain radiating into her buttocks and left lower extremity. She rates her pain 8. Her current exercise regime is walking.   [name] seen Dr. Vertell Limber on 07/ 03/2018, he recommended left lumbar Transforaminal injection, will discuss with Dr. Naaman Plummer, she verbalizes understanding.   [name] Morphine Equivalent is 90.00 MME. She is also prescribed clonazepam  by [name]. We have discussed the black box warning of using opioids and benzodiazepines. I highlighted the dangers of using these drugs together and discussed the adverse events including respiratory suppression, overdose, cognitive impairment and importance of compliance with current regimen. We will continue to monitor and adjust as indicated.   She is being closely monitored and under the care of her psychiatrist Dr. Ebony Hail at [facility].   Oral Swab ordered today.   Pain Inventory Average Pain 7 Pain Right Now 8 My pain is constant, sharp, burning, dull, stabbing, tingling and aching  In the last 24 hours, has pain interfered with the following? General activity 9 Relation with others 9 Enjoyment of life 9 What TIME of day is your pain at its worst? all Sleep (in general) Poor  Pain is worse with: walking, bending, sitting and standing Pain improves with: medication Relief from Meds: 5  Mobility walk with assistance use a cane ability to climb steps?  yes do you drive?  yes transfers alone  Function disabled: date disabled . I need assistance with the following:  shopping  Neuro/Psych bladder control  problems weakness numbness tingling spasms depression anxiety  Prior Studies Any changes since last visit?  no  Physicians involved in your care Any changes since last visit?  no   Family History  Problem Relation Age of Onset  . COPD Mother   . Cancer Mother   . Dementia Mother   . Cancer Father   . CAD Father 51  . Cancer Brother   . Cirrhosis Brother    Social History   Socioeconomic History  . Marital status: Married    Spouse name: Not on file  . Number of children: 2  . Years of education: 44  . Highest education level: High school graduate  Occupational History  . Occupation: DISABLED    Employer: UNEMPLOYED  Social Needs  . Financial resource strain: Not on file  . Food insecurity:    Worry: Not on file    Inability: Not on file  . Transportation needs:    Medical: Not on file    Non-medical: Not on file  Tobacco Use  . Smoking status: Former Smoker    Packs/day: 1.00    Years: 35.00    Pack years: 35.00    Types: Cigarettes    Last attempt to quit: 08/31/2017    Years since quitting: 0.7  . Smokeless tobacco: Never Used  . Tobacco comment: trying to cut back  Substance and Sexual Activity  . Alcohol use: No  . Drug use: No  . Sexual activity: Not on file  Lifestyle  . Physical activity:    Days per week: Not on file  Minutes per session: Not on file  . Stress: Not on file  Relationships  . Social connections:    Talks on phone: Not on file    Gets together: Not on file    Attends religious service: Not on file    Active member of club or organization: Not on file    Attends meetings of clubs or organizations: Not on file    Relationship status: Not on file  Other Topics Concern  . Not on file  Social History Narrative   Lives at home with husband.  They have 2 children.  She is currently disabled because of history of ischemic colitis and COPD.  She does band stretching exercises, but no real active walking type exercise.      She  quit smoking on October 30 after getting home from her COPD exacerbation.   Past Surgical History:  Procedure Laterality Date  . CERVICAL SPINE SURGERY     x 2  . FOOT SURGERY  2009   Past Medical History:  Diagnosis Date  . Arthritis   . COPD (chronic obstructive pulmonary disease) ([place])   . Dysthymia   . GERD (gastroesophageal reflux disease)   . History of ischemic colitis   . History of pulmonary embolism 2015   1 year of Xarelto  . IBS (irritable bowel syndrome)   . Osteoporosis   . Other chronic pain   . Smoker    BP (!) 90/59 (BP Location: Right Arm, Patient Position: Sitting, Cuff Size: Normal)   Pulse 76   Resp 16   Ht 5\' 5"  (1.651 m)   Wt 145 lb (65.8 kg)   SpO2 96%   BMI 24.13 kg/m   Opioid Risk Score:   Fall Risk Score:  `1  Depression screen PHQ 2/9  Depression screen [facility] 2/9 05/04/2018 12/14/2017 11/16/2017 09/21/2017 08/24/2017 02/08/2017 07/21/2016  Decreased Interest 1 3 1 1 1 3  -  Down, Depressed, Hopeless 1 3 1 1 1 3  -  PHQ - 2 Score 2 6 2 2 2 6  -  Altered sleeping - - - - - - 3  Tired, decreased energy - - - - - - 3  Change in appetite - - - - - - -  Feeling bad or failure about yourself  - - - - - - -  Trouble concentrating - - - - - - -  Moving slowly or fidgety/restless - - - - - - -  Suicidal thoughts - - - - - - -  PHQ-9 Score - - - - - - -  Difficult doing work/chores - - - - - - -    Review of Systems  Constitutional: Positive for unexpected weight change.  HENT: Negative.   Eyes: Negative.   Respiratory: Negative.   Cardiovascular: Negative.   Gastrointestinal: Positive for diarrhea, nausea and vomiting.  Endocrine: Negative.   Genitourinary: Negative.   Musculoskeletal: Positive for arthralgias, back pain, gait problem and neck pain.       Spasms   Skin: Negative.   Allergic/Immunologic: Negative.   Neurological: Positive for weakness and numbness.       Tingling   Hematological: Bruises/bleeds easily.  Psychiatric/Behavioral:  Positive for dysphoric mood. The patient is nervous/anxious.        Objective:   Physical Exam  Constitutional: She is oriented to person, place, and time. She appears well-developed and well-nourished.  HENT:  Head: Normocephalic and atraumatic.  Neck: Normal range of motion. Neck supple.  Cervical  Paraspinal Tenderrness: C-5-C-6  Cardiovascular: Normal rate and regular rhythm.  Pulmonary/Chest: Effort normal and breath sounds normal.  Musculoskeletal:  Normal Muscle Bulk and Muscle Testing Reveals: Upper Extremities: Decreased ROM 90 Degrees and Muscle Strength 4/5 Bilateral AC Joint Tenderness: L>R Thoracic Paraspinal Tenderness: T-1-T-3 Lumbar Hypersensitivity Lower Extremities: Right: Full ROM and Muscle Strength 5/5 Left: Decreased ROM and Muscle Strength 4/5 Left Lower Extremity Flexion Produces Pain into Left Hip, Lower back and Left Lower Extremity Arises from table slowly uses cane for support Antalgic gait  Neurological: She is alert and oriented to person, place, and time.  Skin: Skin is warm and dry.  Psychiatric: She has a normal mood and affect. Her behavior is normal.  Nursing note and vitals reviewed.         Assessment & Plan:  1. 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 Patient s/p cervical spine surgery per Dr Patrice Paradise on Mar 09, 2012.We will continue current medication regime withZonegran.06/03/2018 2. Lumbar degenerative disc disease as well as facet arthropathy and chronic low back pain. ContinueCurrent treatment modality withHEP.06/03/2018 3. Lumbar Radiculopathy/ Neuropathic left upper extremity and left lower extremity pain: Continue Zonegran. Seen  Dr. Vertell Limber he  Recommended Transforaminal ESI, will discuss with Dr. Naaman Plummer.  06/03/2018 4. Left Greater trochantericbursitis: Continuewith current treatment modality withIce  Therapy and HEP.06/03/2018 5. Chronic pain syndrome: Continue with current medication and Treatment regimen.Refilled oxycodone 15mg  one tablet QID as needed #120.  We will continue the opioid monitoring program, this consists of regular clinic visits, examinations, urine drug screen, pill counts as well as use of [place] Controlled Substance Reporting System.  6. Depression/anxiety-: Psychiatry Following: Continue Klonopin.06/03/2018 7. Muscle Spasm: Continuecurrent medication Regime with Tizanidine. 06/03/2018 8. 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 [employer] scientist. 06/03/2018 9. Frequent Falls: No Falls. She has a scheduled appointment with Neurologist.   20 minutes of face to face patient care time was spent during this visit. All questions were encouraged and answered.   F/U in 1 month

## 2018-06-07 ENCOUNTER — Telehealth: Payer: Self-pay | Admitting: *Deleted

## 2018-06-07 LAB — DRUG TOX MONITOR 1 W/CONF, ORAL FLD
Amphetamines: NEGATIVE ng/mL (ref <10)
Barbiturates: NEGATIVE ng/mL (ref <10)
Benzodiazepines: NEGATIVE ng/mL (ref <0.50)
Buprenorphine: NEGATIVE ng/mL (ref <0.10)
COTININE: 24.3 ng/mL — AB (ref <5.0)
Cocaine: NEGATIVE ng/mL (ref <5.0)
Codeine: NEGATIVE ng/mL (ref <2.5)
Dihydrocodeine: NEGATIVE ng/mL (ref <2.5)
FENTANYL: 0.75 ng/mL — AB (ref <0.10)
FENTANYL: POSITIVE ng/mL — AB (ref <0.10)
HEROIN METABOLITE: 20.6 ng/mL — AB (ref <1.0)
HYDROCODONE: NEGATIVE ng/mL (ref <2.5)
Heroin Metabolite: POSITIVE ng/mL — AB (ref <1.0)
Hydromorphone: NEGATIVE ng/mL (ref <2.5)
MARIJUANA: NEGATIVE ng/mL (ref <2.5)
MDMA: NEGATIVE ng/mL (ref <10)
MEPROBAMATE: NEGATIVE ng/mL (ref <2.5)
Methadone: NEGATIVE ng/mL (ref <5.0)
Morphine: 73.2 ng/mL — ABNORMAL HIGH (ref <2.5)
NORHYDROCODONE: NEGATIVE ng/mL (ref <2.5)
NOROXYCODONE: 7.9 ng/mL — AB (ref <2.5)
Nicotine Metabolite: POSITIVE ng/mL — AB (ref <5.0)
OPIATES: POSITIVE ng/mL — AB (ref <2.5)
OXYMORPHONE: NEGATIVE ng/mL (ref <2.5)
Oxycodone: 108.7 ng/mL — ABNORMAL HIGH (ref <2.5)
PHENCYCLIDINE: NEGATIVE ng/mL (ref <10)
TAPENTADOL: NEGATIVE ng/mL (ref <5.0)
Tramadol: NEGATIVE ng/mL (ref <5.0)
ZOLPIDEM: NEGATIVE ng/mL (ref <5.0)

## 2018-06-07 LAB — DRUG TOX ALC METAB W/CON, ORAL FLD: Alcohol Metabolite: NEGATIVE ng/mL (ref <25)

## 2018-06-07 NOTE — Telephone Encounter (Signed)
Allison Lee called about getting a letter for exemption from jury duty. I called her back to discuss her oral swab drug screen. It is positive for fentanyl, its metabolites, heroin and its metabolite and morphine which heroin breaks down into morphine, as well as her oxycodone and its metabolites. She denies using anything except what she has been prescribed. She said her pills have been a different color from what they had been. I told her that that would not explain heroin and fentanyl. I explained to her that I would need to talk with Dr Naaman Plummer tomorrow to determine if there would be any further rx to taper (doubtful since she just filled her new rx today), and where we should recommend to her to get help.We will not be writing letter to excuse from jury duty.

## 2018-06-07 NOTE — Telephone Encounter (Signed)
Oral swab for this encounter was positive for Fentanyl and its metabolites, Heroin and its metabolite and morphine which it metabolizes into, as well as her oxycodone.This will result in discharge from our clinic. I have discussed with Hermione in a separate phone message.

## 2018-06-08 NOTE — Telephone Encounter (Deleted)
Dr Naaman Plummer, please advise on where I should recommend she seek treatment (though she denies using) and she just filled her Rx 06/07/18. If you want to taper need directions.  She has been on medication for probably 20 yrs (since before being Dr Richardine Service pt)

## 2018-06-10 NOTE — Telephone Encounter (Signed)
Oxycodone 15mg   1 TID for 2 weeks 1 BID for 10 days 1/2 BID for 10 days 1/2 QD for 10 days (or for ever how many she has left)  then off   [facility] or [facility]

## 2018-06-10 NOTE — Telephone Encounter (Signed)
I sent the directions through myChart and called [facility].  I have mailed letter as well as sent through [place]. Included in mail is a pamphlet for [place] and surrounding pain clinics. I encouraged her to get with her PCP and discuss.

## 2018-07-18 ENCOUNTER — Ambulatory Visit: Payer: Medicare Other | Admitting: Registered Nurse

## 2018-08-19 ENCOUNTER — Other Ambulatory Visit: Payer: Self-pay | Admitting: Neurological Surgery

## 2018-08-19 DIAGNOSIS — M5416 Radiculopathy, lumbar region: Secondary | ICD-10-CM

## 2018-08-19 DIAGNOSIS — R29898 Other symptoms and signs involving the musculoskeletal system: Secondary | ICD-10-CM

## 2018-08-19 DIAGNOSIS — R200 Anesthesia of skin: Secondary | ICD-10-CM

## 2018-08-28 ENCOUNTER — Ambulatory Visit
Admission: RE | Admit: 2018-08-28 | Discharge: 2018-08-28 | Disposition: A | Discharge status: Home / Self Care | Payer: Medicare Other | Source: Ambulatory Visit | Attending: Neurological Surgery | Admitting: Neurological Surgery

## 2018-08-28 DIAGNOSIS — R200 Anesthesia of skin: Secondary | ICD-10-CM

## 2018-08-28 DIAGNOSIS — R29898 Other symptoms and signs involving the musculoskeletal system: Secondary | ICD-10-CM

## 2018-08-28 DIAGNOSIS — M5416 Radiculopathy, lumbar region: Secondary | ICD-10-CM

## 2018-08-28 MED ORDER — GADOBENATE DIMEGLUMINE 529 MG/ML IV SOLN
13.0000 mL | Freq: Once | INTRAVENOUS | Status: AC | PRN
  Administered 2018-08-28: 13 mL via INTRAVENOUS

## 2018-09-13 IMAGING — US US THYROID
1 series · 13 of 25 positions shown · non-contrast
Comparison: 07/11/2012

CLINICAL DATA: Thyroid nodules, previous biopsies 07/19/2012

EXAM:
THYROID ULTRASOUND
TECHNIQUE: Ultrasound examination of the thyroid gland and adjacent soft
tissues was performed.

[Series 1: us thyroid · 0.06mm/px · 13 of 53 slices shown]
[im 1/53]
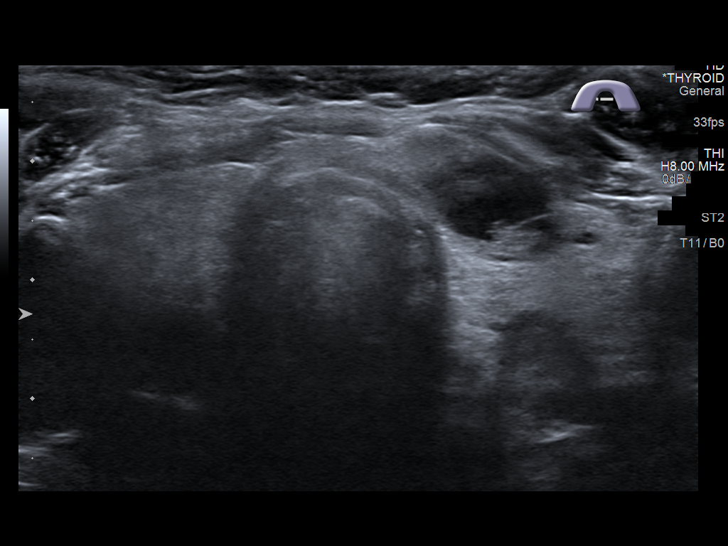
[im 5/53]
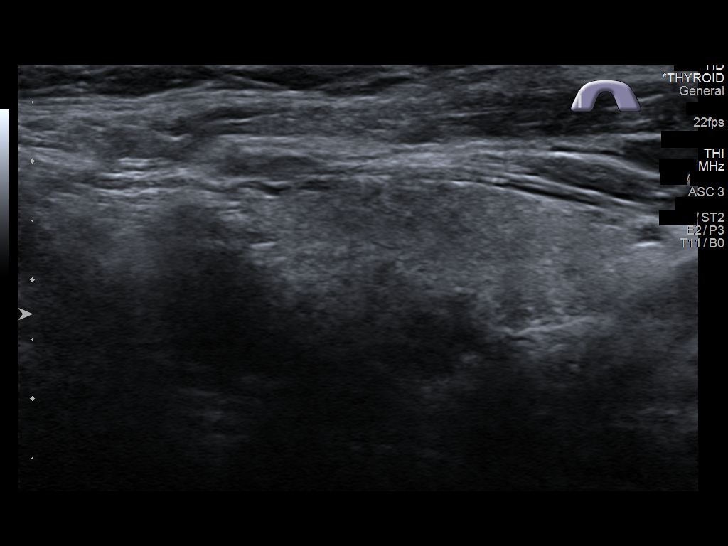
[im 9/53]
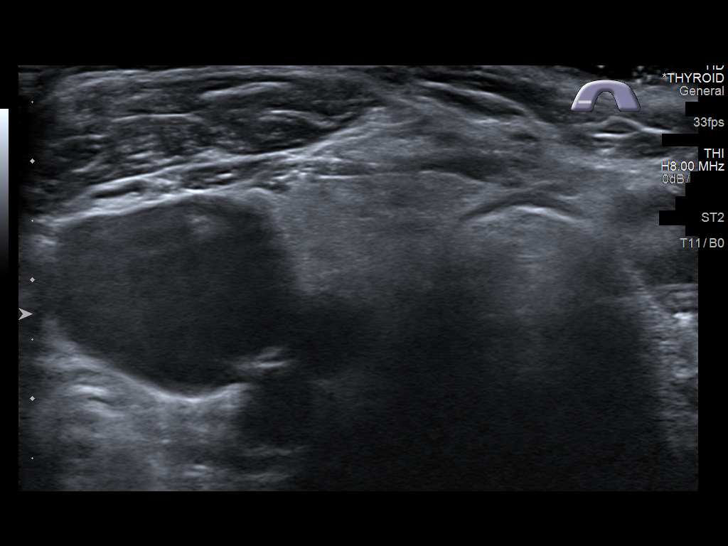
[im 14/53]
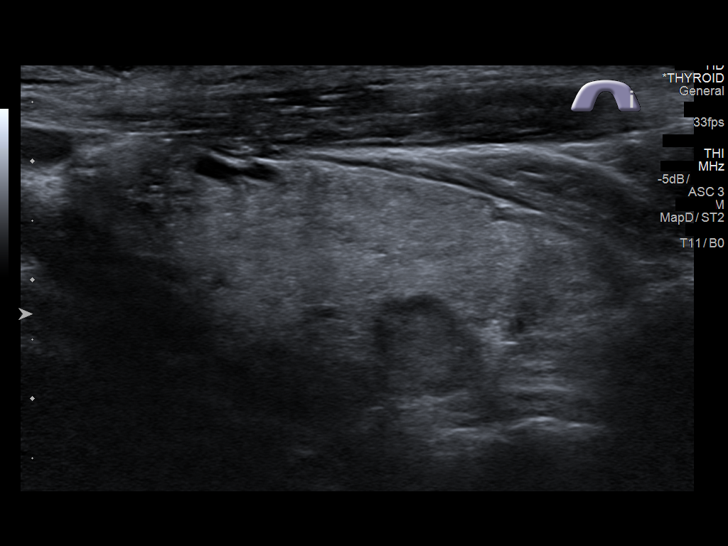
[im 18/53]
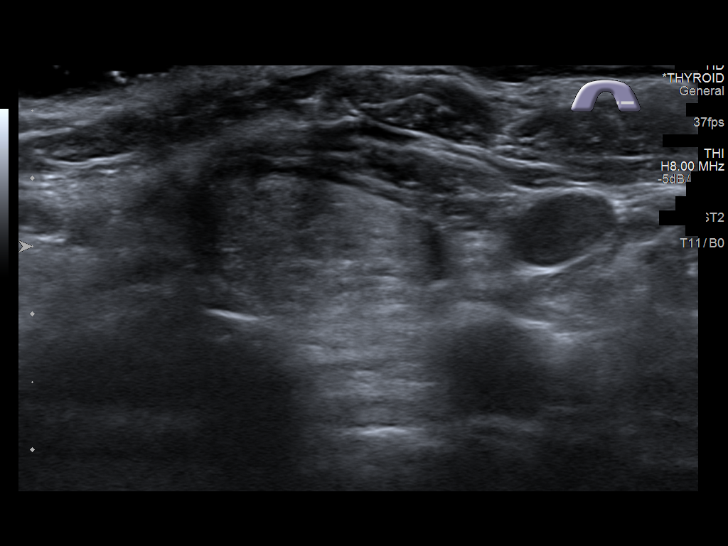
[im 22/53]
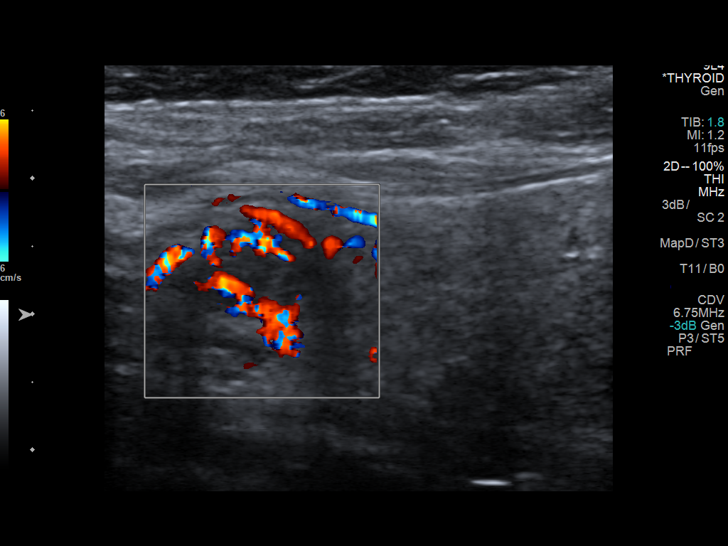
[im 27/53]
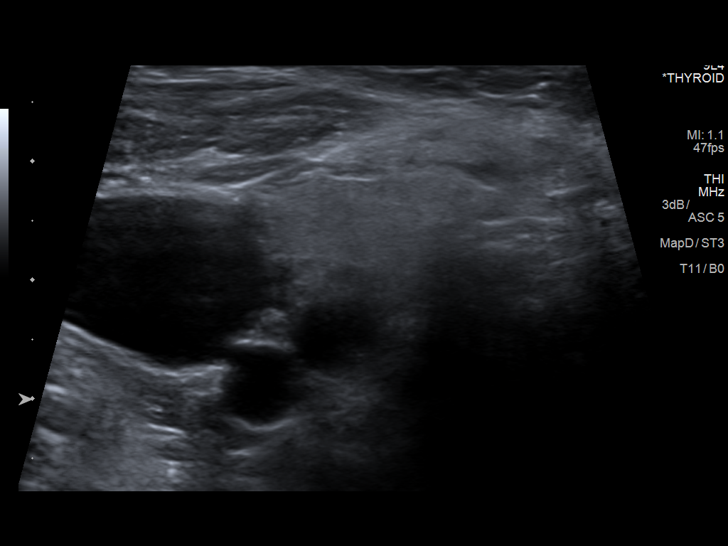
[im 31/53]
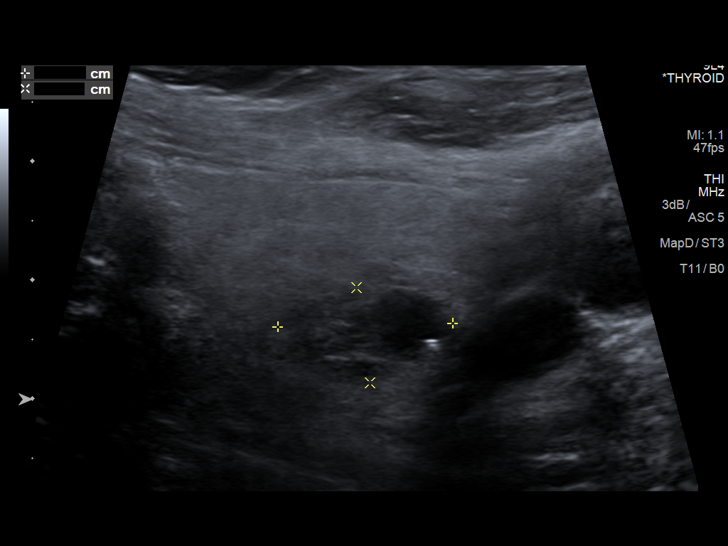
[im 35/53]
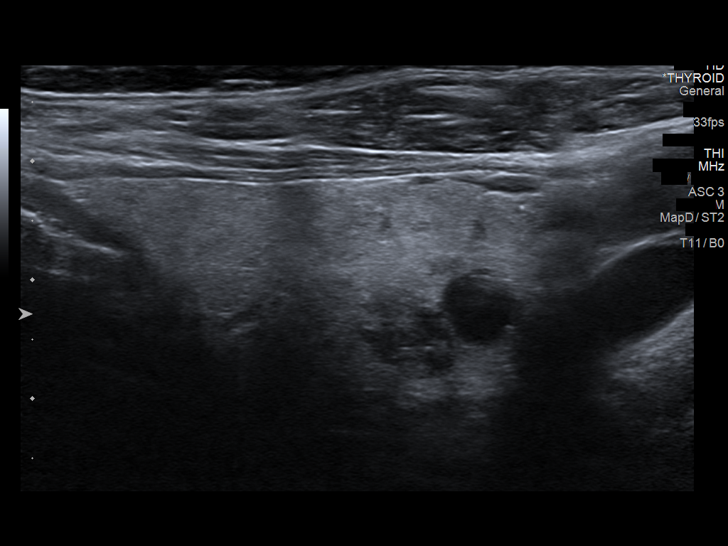
[im 40/53]
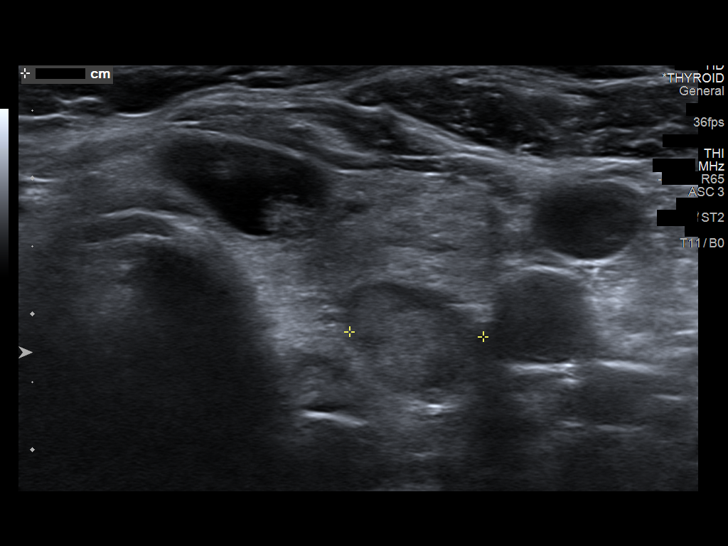
[im 44/53]
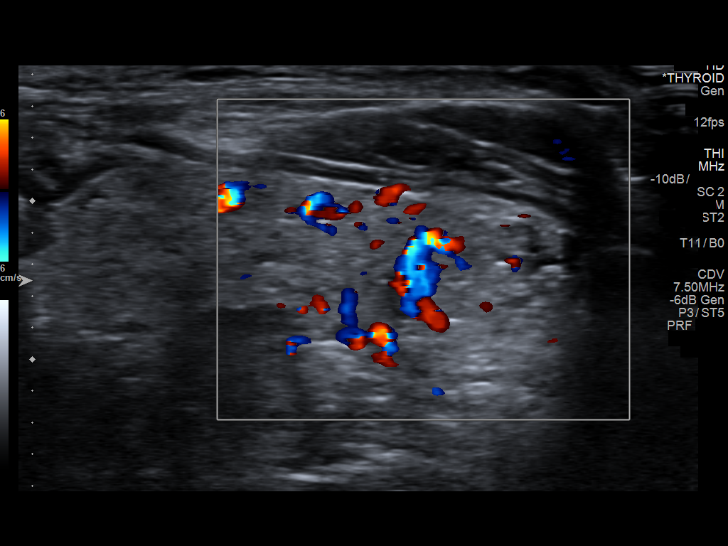
[im 48/53]
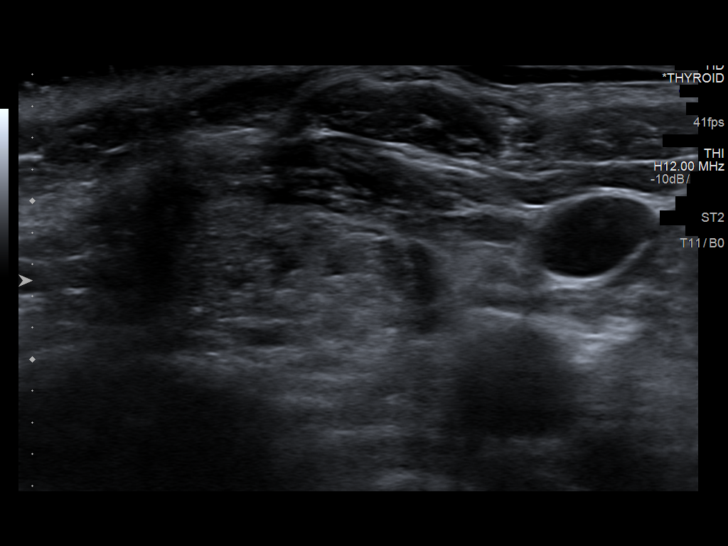
[im 53/53]
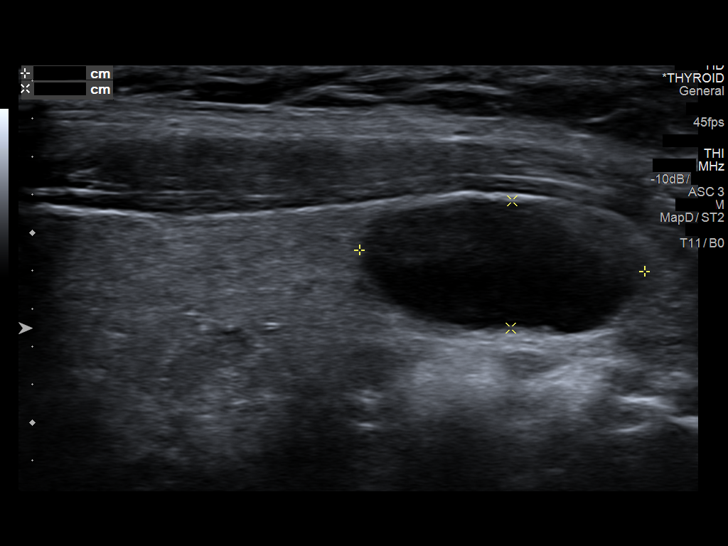

[13 of 25 positions shown; findings below may reference images not displayed]

FINDINGS: Parenchymal Echotexture: Moderately heterogeneous

Isthmus: 2 mm

Right lobe: 5.6 x 2.1 x 1.8 cm, previously 6.8 x 1.5 x

Left lobe: 5.7 x 2.2 x 2.0 cm, previously 5.8 x 1.6 x 2.3 cm

_________________________________________________________

Estimated total number of nodules >/= 1 cm: 4

Number of spongiform nodules >/=  2 cm not described below (TR1): 0

Number of mixed cystic and solid nodules >/= 1.5 cm not described
below (TR2): 2

_________________________________________________________

Nodule # 1:

Location: Right; Superior

Maximum size: 0.9 cm; Other 2 dimensions: 0.8 x 0.8, unchanged cm

Composition: solid/almost completely solid (2)

Echogenicity: hypoechoic (2)

Shape: not taller-than-wide (0)

Margins: smooth (0)

Echogenic foci: none (0)

ACR TI-RADS total points: 4.

ACR TI-RADS risk category: TR4 (4-6 points).

ACR TI-RADS recommendations:

Given size (<0.9 cm) and appearance, this nodule does NOT meet
TI-RADS criteria for biopsy or dedicated follow-up.

_________________________________________________________

Nodule # 2 correlates with nodule 3 on the worksheet

Location: Left; Mid

Maximum size: 1.0, previously 0.9 cm; Other 2 dimensions: 0.9 x 0.7,
unchanged cm

Composition: solid/almost completely solid (2)

Echogenicity: isoechoic (1)

Shape: not taller-than-wide (0)

Margins: smooth (0)

Echogenic foci: none (0)

ACR TI-RADS total points: 3.

ACR TI-RADS risk category: TR3 (3 points).

ACR TI-RADS recommendations:

Given size (<1.4 cm) and appearance, this nodule does NOT meet
TI-RADS criteria for biopsy or dedicated follow-up.

_________________________________________________________

Nodule # 3 correlates with nodule 5 point worksheet:

Location: Left; Inferior

Maximum size: 1.3, previously 1.0 cm; Other 2 dimensions: 1.2 x 1.1,
previously 1.1 x 0.9 cm

Composition: solid/almost completely solid (2)

Echogenicity: isoechoic (1)

Shape: not taller-than-wide (0)

Margins: ill-defined (0)

Echogenic foci: large comet-tail artifacts (0)

ACR TI-RADS total points: 3.

ACR TI-RADS risk category: TR3 (3 points).

ACR TI-RADS recommendations:

Given size (<1.4 cm) and appearance, this nodule does NOT meet
TI-RADS criteria for biopsy or dedicated follow-up.

_________________________________________________________
IMPRESSION: TR 3 and TR 4 nodules as above, these nodules do not meet criteria
for biopsy or follow-up. No significant interval change compared
07/19/2012.

The above is in keeping with the ACR TI-RADS recommendations - [HOSPITAL] 7303;[DATE].

## 2019-07-12 ENCOUNTER — Other Ambulatory Visit: Payer: Self-pay | Admitting: Family Medicine

## 2019-07-12 DIAGNOSIS — Z1231 Encounter for screening mammogram for malignant neoplasm of breast: Secondary | ICD-10-CM

## 2019-07-21 ENCOUNTER — Ambulatory Visit: Payer: Medicare Other

## 2019-09-14 ENCOUNTER — Other Ambulatory Visit: Payer: Self-pay

## 2019-09-14 ENCOUNTER — Ambulatory Visit
Admission: RE | Admit: 2019-09-14 | Discharge: 2019-09-14 | Disposition: A | Discharge status: Home / Self Care | Payer: Medicare Other | Source: Ambulatory Visit | Attending: Family Medicine | Admitting: Family Medicine

## 2019-09-14 DIAGNOSIS — Z1231 Encounter for screening mammogram for malignant neoplasm of breast: Secondary | ICD-10-CM

## 2019-09-18 ENCOUNTER — Other Ambulatory Visit: Payer: Self-pay | Admitting: Family Medicine

## 2019-09-18 DIAGNOSIS — R928 Other abnormal and inconclusive findings on diagnostic imaging of breast: Secondary | ICD-10-CM

## 2019-09-22 ENCOUNTER — Other Ambulatory Visit: Payer: Self-pay

## 2019-09-22 ENCOUNTER — Ambulatory Visit: Admission: RE | Admit: 2019-09-22 | Payer: Medicare Other | Source: Ambulatory Visit

## 2019-09-22 ENCOUNTER — Ambulatory Visit
Admission: RE | Admit: 2019-09-22 | Discharge: 2019-09-22 | Disposition: A | Discharge status: Home / Self Care | Payer: Medicare Other | Source: Ambulatory Visit | Attending: Family Medicine | Admitting: Family Medicine

## 2019-09-22 DIAGNOSIS — R928 Other abnormal and inconclusive findings on diagnostic imaging of breast: Secondary | ICD-10-CM

## 2019-09-26 ENCOUNTER — Other Ambulatory Visit: Payer: Medicare Other

## 2020-10-24 ENCOUNTER — Other Ambulatory Visit: Payer: Self-pay | Admitting: Family Medicine

## 2020-10-24 DIAGNOSIS — Z1231 Encounter for screening mammogram for malignant neoplasm of breast: Secondary | ICD-10-CM

## 2020-12-03 ENCOUNTER — Other Ambulatory Visit: Payer: Self-pay

## 2020-12-03 ENCOUNTER — Ambulatory Visit
Admission: RE | Admit: 2020-12-03 | Discharge: 2020-12-03 | Disposition: A | Discharge status: Home / Self Care | Payer: Medicare Other | Source: Ambulatory Visit | Attending: Family Medicine | Admitting: Family Medicine

## 2020-12-03 DIAGNOSIS — Z1231 Encounter for screening mammogram for malignant neoplasm of breast: Secondary | ICD-10-CM | POA: Diagnosis not present

## 2020-12-11 ENCOUNTER — Ambulatory Visit: Payer: Medicare Other | Admitting: Dermatology

## 2020-12-11 ENCOUNTER — Encounter: Payer: Self-pay | Admitting: Dermatology

## 2020-12-11 ENCOUNTER — Other Ambulatory Visit: Payer: Self-pay

## 2020-12-11 DIAGNOSIS — C44712 Basal cell carcinoma of skin of right lower limb, including hip: Secondary | ICD-10-CM

## 2020-12-11 DIAGNOSIS — Z1283 Encounter for screening for malignant neoplasm of skin: Secondary | ICD-10-CM | POA: Diagnosis not present

## 2020-12-11 DIAGNOSIS — L821 Other seborrheic keratosis: Secondary | ICD-10-CM

## 2020-12-11 DIAGNOSIS — Q828 Other specified congenital malformations of skin: Secondary | ICD-10-CM | POA: Diagnosis not present

## 2020-12-11 DIAGNOSIS — D485 Neoplasm of uncertain behavior of skin: Secondary | ICD-10-CM

## 2020-12-11 NOTE — Patient Instructions (Signed)

## 2020-12-18 ENCOUNTER — Telehealth: Payer: Self-pay

## 2020-12-18 NOTE — Telephone Encounter (Signed)
Left message to call patient needs mohs 

## 2020-12-18 NOTE — Telephone Encounter (Signed)
-----   Message from Lavonna Monarch, MD sent at 12/18/2020  6:43 AM EST ----- Schedule Mohs

## 2020-12-18 NOTE — Telephone Encounter (Signed)
Release:  [id number] PATIENT AWARE OF MOHS REFERRAL.

## 2020-12-20 ENCOUNTER — Encounter: Payer: Self-pay | Admitting: Dermatology

## 2020-12-20 NOTE — Progress Notes (Signed)
   New Patient   Subjective  Allison Lee is a 65 y.o. female who presents for the following: Skin Problem (Here to have spots looked at on legs, right breast & right temple) and Annual Exam (Full body skin check).  General skin check, multiple spots on legs Location:  Duration:  Quality:  Associated Signs/Symptoms: Modifying Factors:  Severity:  Timing: Context:    The following portions of the chart were reviewed this encounter and updated as appropriate:  Tobacco  Allergies  Meds  Problems  Med Hx  Surg Hx  Fam Hx      Objective  Well appearing patient in no apparent distress; mood and affect are within normal limits. Objective  Right Inguinal Area: Full body skin examination-no atypical moles, melanoma.  Possible nonmelanoma skin cancers on legs  Objective  Left Breast: 1 cm moderately thick flattopped textured brown papule.  Dermoscopy confirms.  Objective  Left Lower Leg - Anterior: Waxy pink 1.2 cm crust, rule out carcinoma in situ     Objective  Right Lower Leg - Anterior: Centrally ulcerated waxy 1.3 cm plaque, rule out BCC      A full examination was performed including scalp, head, eyes, ears, nose, lips, neck, chest, axillae, abdomen, back, buttocks, bilateral upper extremities, bilateral lower extremities, hands, feet, fingers, toes, fingernails, and toenails. All findings within normal limits unless otherwise noted below.   Assessment & Plan  Encounter for screening for malignant neoplasm of skin Right Inguinal Area  Yearly skin check, encouraged to self examine her skin twice annually.  Continue ultraviolet protection.  Seborrheic keratosis Left Breast  Okay to leave if stable  Neoplasm of uncertain behavior of skin (2) Left Lower Leg - Anterior  Skin / nail biopsy Type of biopsy: tangential   Informed consent: discussed and consent obtained   Timeout: patient name, date of birth, surgical site, and procedure verified    Procedure prep:  Patient was prepped and draped in usual sterile fashion (Non sterile) Prep type:  Chlorhexidine Anesthesia: the lesion was anesthetized in a standard fashion   Anesthetic:  1% lidocaine w/ epinephrine 1-100,000 local infiltration Instrument used: flexible razor blade   Outcome: patient tolerated procedure well   Post-procedure details: wound care instructions given    Specimen 1 - Surgical pathology Differential Diagnosis: bcc vs scc  Check Margins: No  Right Lower Leg - Anterior  Skin / nail biopsy Type of biopsy: tangential   Informed consent: discussed and consent obtained   Timeout: patient name, date of birth, surgical site, and procedure verified   Procedure prep:  Patient was prepped and draped in usual sterile fashion (Non sterile) Prep type:  Chlorhexidine Anesthesia: the lesion was anesthetized in a standard fashion   Anesthetic:  1% lidocaine w/ epinephrine 1-100,000 local infiltration Instrument used: flexible razor blade   Outcome: patient tolerated procedure well   Post-procedure details: wound care instructions given    Specimen 2 - Surgical pathology Differential Diagnosis: bcc vs scc  Check Margins: No

## 2021-01-15 ENCOUNTER — Telehealth: Payer: Self-pay | Admitting: Dermatology

## 2021-01-15 NOTE — Telephone Encounter (Signed)
Message on machine; said someone left her a message a few days ago but she doesn't know what it's about

## 2021-01-15 NOTE — Telephone Encounter (Signed)
Called patient back- it was a old message from where we were trying to give pathology results; patient already sent to skin surgery center for treatment.

## 2021-02-13 DIAGNOSIS — C44712 Basal cell carcinoma of skin of right lower limb, including hip: Secondary | ICD-10-CM | POA: Diagnosis not present

## 2021-02-13 DIAGNOSIS — R600 Localized edema: Secondary | ICD-10-CM | POA: Diagnosis not present

## 2021-02-13 DIAGNOSIS — S81801A Unspecified open wound, right lower leg, initial encounter: Secondary | ICD-10-CM | POA: Diagnosis not present

## 2021-02-20 DIAGNOSIS — S81801D Unspecified open wound, right lower leg, subsequent encounter: Secondary | ICD-10-CM | POA: Diagnosis not present

## 2021-03-24 DIAGNOSIS — S81801A Unspecified open wound, right lower leg, initial encounter: Secondary | ICD-10-CM | POA: Diagnosis not present

## 2021-04-11 DIAGNOSIS — Z1159 Encounter for screening for other viral diseases: Secondary | ICD-10-CM | POA: Diagnosis not present

## 2021-04-11 DIAGNOSIS — H02403 Unspecified ptosis of bilateral eyelids: Secondary | ICD-10-CM | POA: Diagnosis not present

## 2021-04-11 DIAGNOSIS — Z0000 Encounter for general adult medical examination without abnormal findings: Secondary | ICD-10-CM | POA: Diagnosis not present

## 2021-04-11 DIAGNOSIS — M8588 Other specified disorders of bone density and structure, other site: Secondary | ICD-10-CM | POA: Diagnosis not present

## 2021-04-11 DIAGNOSIS — J449 Chronic obstructive pulmonary disease, unspecified: Secondary | ICD-10-CM | POA: Diagnosis not present

## 2021-04-11 DIAGNOSIS — G8929 Other chronic pain: Secondary | ICD-10-CM | POA: Diagnosis not present

## 2021-04-11 DIAGNOSIS — C4490 Unspecified malignant neoplasm of skin, unspecified: Secondary | ICD-10-CM | POA: Diagnosis not present

## 2021-04-11 DIAGNOSIS — R609 Edema, unspecified: Secondary | ICD-10-CM | POA: Diagnosis not present

## 2021-04-11 DIAGNOSIS — K219 Gastro-esophageal reflux disease without esophagitis: Secondary | ICD-10-CM | POA: Diagnosis not present

## 2021-04-11 DIAGNOSIS — E876 Hypokalemia: Secondary | ICD-10-CM | POA: Diagnosis not present

## 2021-04-11 DIAGNOSIS — Z8379 Family history of other diseases of the digestive system: Secondary | ICD-10-CM | POA: Diagnosis not present

## 2021-04-17 ENCOUNTER — Other Ambulatory Visit: Payer: Self-pay | Admitting: Family Medicine

## 2021-04-17 DIAGNOSIS — M8580 Other specified disorders of bone density and structure, unspecified site: Secondary | ICD-10-CM

## 2021-04-24 DIAGNOSIS — S81801A Unspecified open wound, right lower leg, initial encounter: Secondary | ICD-10-CM | POA: Diagnosis not present

## 2021-05-21 DIAGNOSIS — R519 Headache, unspecified: Secondary | ICD-10-CM | POA: Diagnosis not present

## 2021-05-21 DIAGNOSIS — Z20822 Contact with and (suspected) exposure to covid-19: Secondary | ICD-10-CM | POA: Diagnosis not present

## 2021-05-21 DIAGNOSIS — R079 Chest pain, unspecified: Secondary | ICD-10-CM | POA: Diagnosis not present

## 2021-05-21 DIAGNOSIS — R5383 Other fatigue: Secondary | ICD-10-CM | POA: Diagnosis not present

## 2021-06-18 ENCOUNTER — Encounter: Payer: Self-pay | Admitting: Plastic Surgery

## 2021-06-18 ENCOUNTER — Other Ambulatory Visit: Payer: Self-pay

## 2021-06-18 ENCOUNTER — Ambulatory Visit (INDEPENDENT_AMBULATORY_CARE_PROVIDER_SITE_OTHER): Payer: Medicare Other | Admitting: Plastic Surgery

## 2021-06-18 VITALS — BP 116/77 | HR 80 | Ht 63.0 in | Wt 130.4 lb

## 2021-06-18 DIAGNOSIS — H02834 Dermatochalasis of left upper eyelid: Secondary | ICD-10-CM

## 2021-06-18 DIAGNOSIS — H02831 Dermatochalasis of right upper eyelid: Secondary | ICD-10-CM | POA: Diagnosis not present

## 2021-06-18 NOTE — Progress Notes (Signed)
Referring Provider Kelton Pillar, MD [place] [address] [employer] [address] [place],  [place] [address]   CC:  Chief Complaint  Patient presents with   Advice Only      Allison Lee is an 65 y.o. female.  HPI: Patient presents to discuss trouble with her superior visual field.  She is bothered by the excess skin on her upper eyelids.  She feels like particularly later on in the day she has trouble keeping her eyes open and notices relief when she lifts her eyelids herself with her fingers.  She has no previous eye injuries or procedures.  She is interested in surgical treatment of this problem.  Allergies  Allergen Reactions   Methadone Other (See Comments)    Fluid retention   Hydrocodone Nausea Only   Topamax Other (See Comments)    osteoporosis   Hctz [Hydrochlorothiazide] Rash   Neurontin [Gabapentin] Swelling   Pregabalin Other (See Comments)    Dizziness    Outpatient Encounter Medications as of 06/18/2021  Medication Sig Note   albuterol (VENTOLIN HFA) 108 (90 Base) MCG/ACT inhaler Ventolin HFA 90 mcg/actuation aerosol inhaler    buprenorphine-naloxone (SUBOXONE) 8-2 mg SUBL SL tablet Place 1 tablet under the tongue 2 (two) times daily.    Calcium-Vitamin D 500-100 MG-UNIT WAFR Take 1 each by mouth 2 (two) times daily.     clonazePAM (KLONOPIN) 1 MG tablet Take 1 mg by mouth 2 (two) times daily.  06/03/2018: LD; 06/03/2018  AM   DULoxetine (CYMBALTA) 30 MG capsule Take 30 mg by mouth 2 (two) times daily.     furosemide (LASIX) 20 MG tablet Take 20 mg by mouth daily.    hydrocortisone 2.5 % lotion Apply topically 2 (two) times daily.    omeprazole (PRILOSEC) 40 MG capsule Take 40 mg by mouth daily.     potassium chloride (K-DUR) 10 MEQ tablet Take 10 mEq by mouth daily.    potassium chloride SA (K-DUR,KLOR-CON) 20 MEQ tablet     tiZANidine (ZANAFLEX) 4 MG tablet TAKE ONE-HALF TO ONE TABLET (2-'4MG'$ ) BY MOUTH EVERY 8 HOURS AS NEEDED FOR MUSCLESPASMS    zonisamide (ZONEGRAN) 100 MG  capsule Take 1 capsule (100 mg total) by mouth 3 (three) times daily. 1 capsule in morning and 2 capsules at bedtime    [DISCONTINUED] oxyCODONE (ROXICODONE) 15 MG immediate release tablet Take 1 tablet (15 mg total) by mouth 4 (four) times daily as needed.    No facility-administered encounter medications on file as of 06/18/2021.     Past Medical History:  Diagnosis Date   Arthritis    COPD (chronic obstructive pulmonary disease) ([facility])    Dysthymia    GERD (gastroesophageal reflux disease)    History of ischemic colitis    History of pulmonary embolism 2015   1 year of Xarelto   IBS (irritable bowel syndrome)    Osteoporosis    Other chronic pain    Smoker     Past Surgical History:  Procedure Laterality Date   CERVICAL SPINE SURGERY     x 2   FOOT SURGERY  2009    Family History  Problem Relation Age of Onset   COPD Mother    Cancer Mother    Dementia Mother    Cancer Father    CAD Father 78   Cancer Brother    Cirrhosis Brother     Social History   Social History Narrative   Lives at home with husband.  They have 2  children.  She is currently disabled because of history of ischemic colitis and COPD.  She does band stretching exercises, but no real active walking type exercise.      She quit smoking on October 30 after getting home from her COPD exacerbation.     Review of Systems General: Denies fevers, chills, weight loss CV: Denies chest pain, shortness of breath, palpitations  Physical Exam Vitals with BMI 06/18/2021 06/03/2018 05/04/2018  Height '5\' 3"'$  '5\' 5"'$  '5\' 5"'$   Weight 130 lbs 6 oz 145 lbs 144 lbs  BMI 23.11 [id number]  Systolic [id number]  Diastolic 77 59 81  Pulse 80 76 83    General:  No acute distress,  Alert and oriented, Non-Toxic, Normal speech and affect On exam she has bilateral upper eyelid dermatochalasis that is significant.  Excess skin is resting on her eyelashes.  Her eyebrows look to be in good position and symmetric.  Her eyelid  margins look to be in good position and symmetric in her MRD 1 is around 3 mm.  No obvious scars.  She does not have a lot of fullness medially.  The sclera otherwise are unremarkable.  She looks to have normal levator function.  Assessment/Plan Patient is a good candidate for bilateral upper lid blepharoplasty.  We discussed the details of that procedure today along with the location and orientation of the scars.  We discussed the risk that include bleeding, infection, damage to surrounding structures and need for additional procedures.  We will plan to obtain a visual field study and submit to insurance for approval.  All her questions were answered.  Cindra Presume 06/18/2021, 2:20 PM

## 2021-08-11 ENCOUNTER — Ambulatory Visit: Payer: Medicare Other | Admitting: Dermatology

## 2021-08-11 ENCOUNTER — Other Ambulatory Visit: Payer: Self-pay

## 2021-08-11 DIAGNOSIS — Z85828 Personal history of other malignant neoplasm of skin: Secondary | ICD-10-CM

## 2021-08-11 DIAGNOSIS — D0472 Carcinoma in situ of skin of left lower limb, including hip: Secondary | ICD-10-CM

## 2021-08-11 DIAGNOSIS — D099 Carcinoma in situ, unspecified: Secondary | ICD-10-CM

## 2021-08-11 MED ORDER — IMIQUIMOD 5 % EX CREA: TOPICAL_CREAM | CUTANEOUS | 0 refills | Status: DC

## 2021-08-12 ENCOUNTER — Telehealth: Payer: Self-pay | Admitting: Dermatology

## 2021-08-12 NOTE — Telephone Encounter (Signed)
Patient left message on office voice mail that she was calling about how to use the Imiquimod.

## 2021-08-13 NOTE — Telephone Encounter (Signed)
Allison Monarch, MD  You; [facility] Yesterday (11:33 AM)   Apply to spots on legs Monday and Wednesday and Friday sometime between dinner and an hour before going to sleep, goal should be a total of roughly 20 applications (roughly 6 to 7 weeks) or until she sees brisk inflammation on the treatment site.   My chart message sent

## 2021-08-16 DIAGNOSIS — Z23 Encounter for immunization: Secondary | ICD-10-CM | POA: Diagnosis not present

## 2021-08-18 DIAGNOSIS — J449 Chronic obstructive pulmonary disease, unspecified: Secondary | ICD-10-CM | POA: Diagnosis not present

## 2021-08-18 DIAGNOSIS — C44712 Basal cell carcinoma of skin of right lower limb, including hip: Secondary | ICD-10-CM | POA: Diagnosis not present

## 2021-08-18 DIAGNOSIS — G8929 Other chronic pain: Secondary | ICD-10-CM | POA: Diagnosis not present

## 2021-08-18 DIAGNOSIS — D099 Carcinoma in situ, unspecified: Secondary | ICD-10-CM | POA: Diagnosis not present

## 2021-08-18 DIAGNOSIS — Z008 Encounter for other general examination: Secondary | ICD-10-CM | POA: Diagnosis not present

## 2021-08-18 DIAGNOSIS — M5136 Other intervertebral disc degeneration, lumbar region: Secondary | ICD-10-CM | POA: Diagnosis not present

## 2021-08-18 DIAGNOSIS — Z0000 Encounter for general adult medical examination without abnormal findings: Secondary | ICD-10-CM | POA: Diagnosis not present

## 2021-08-18 DIAGNOSIS — F17200 Nicotine dependence, unspecified, uncomplicated: Secondary | ICD-10-CM | POA: Diagnosis not present

## 2021-08-18 DIAGNOSIS — G4700 Insomnia, unspecified: Secondary | ICD-10-CM | POA: Diagnosis not present

## 2021-08-18 DIAGNOSIS — Z79899 Other long term (current) drug therapy: Secondary | ICD-10-CM | POA: Diagnosis not present

## 2021-08-25 ENCOUNTER — Encounter: Payer: Self-pay | Admitting: Dermatology

## 2021-08-25 NOTE — Progress Notes (Signed)
   Follow-Up Visit   Subjective  Allison Lee is a 65 y.o. female who presents for the following: Skin Problem (Here for 2 lesions on the left leg. History of non mole skin cancers. ).  Nonhealing crust left leg Location:  Duration:  Quality:  Associated Signs/Symptoms: Modifying Factors:  Severity:  Timing: Context:   Objective  Well appearing patient in no apparent distress; mood and affect are within normal limits. Left Lower Leg - Anterior History of biopsies 8 months ago on lower legs showing BCC on right side porokeratosis on the left.  Clinically these waxy 1+ medial inflamed crust benefit carcinoma in situ than porokeratosis.  Photo taken       A focused examination was performed including arms and legs. Relevant physical exam findings are noted in the Assessment and Plan.   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  We will try to obtain clearing with topical Aldara, goal of 73-22 applications either 3 or 5 times weekly.  If too inflamed she will hold on therapy and contact me.      I, Lavonna Monarch, MD, have reviewed all documentation for this visit.  The documentation on 08/25/21 for the exam, diagnosis, procedures, and orders are all accurate and complete.

## 2021-08-26 ENCOUNTER — Ambulatory Visit: Payer: Self-pay | Admitting: Nurse Practitioner

## 2021-09-29 ENCOUNTER — Encounter: Payer: Self-pay | Admitting: Internal Medicine

## 2021-10-02 ENCOUNTER — Encounter: Payer: Self-pay | Admitting: Internal Medicine

## 2021-10-14 ENCOUNTER — Ambulatory Visit
Admission: RE | Admit: 2021-10-14 | Discharge: 2021-10-14 | Disposition: A | Discharge status: Home / Self Care | Payer: Medicare Other | Source: Ambulatory Visit | Attending: Family Medicine | Admitting: Family Medicine

## 2021-10-14 DIAGNOSIS — Z780 Asymptomatic menopausal state: Secondary | ICD-10-CM | POA: Diagnosis not present

## 2021-10-14 DIAGNOSIS — M8580 Other specified disorders of bone density and structure, unspecified site: Secondary | ICD-10-CM

## 2021-10-14 DIAGNOSIS — M8588 Other specified disorders of bone density and structure, other site: Secondary | ICD-10-CM | POA: Diagnosis not present

## 2021-10-14 DIAGNOSIS — M810 Age-related osteoporosis without current pathological fracture: Secondary | ICD-10-CM | POA: Diagnosis not present

## 2021-11-04 ENCOUNTER — Ambulatory Visit: Payer: Medicare Other | Admitting: Internal Medicine

## 2021-11-12 ENCOUNTER — Ambulatory Visit: Payer: Medicare Other | Admitting: Dermatology

## 2021-11-14 ENCOUNTER — Other Ambulatory Visit: Payer: Self-pay | Admitting: Obstetrics and Gynecology

## 2021-11-14 DIAGNOSIS — Z1231 Encounter for screening mammogram for malignant neoplasm of breast: Secondary | ICD-10-CM

## 2021-12-01 DIAGNOSIS — H47323 Drusen of optic disc, bilateral: Secondary | ICD-10-CM | POA: Diagnosis not present

## 2021-12-01 DIAGNOSIS — H5203 Hypermetropia, bilateral: Secondary | ICD-10-CM | POA: Diagnosis not present

## 2021-12-01 DIAGNOSIS — H02831 Dermatochalasis of right upper eyelid: Secondary | ICD-10-CM | POA: Diagnosis not present

## 2021-12-01 DIAGNOSIS — H02834 Dermatochalasis of left upper eyelid: Secondary | ICD-10-CM | POA: Diagnosis not present

## 2021-12-04 ENCOUNTER — Other Ambulatory Visit: Payer: Self-pay

## 2021-12-04 ENCOUNTER — Ambulatory Visit
Admission: RE | Admit: 2021-12-04 | Discharge: 2021-12-04 | Disposition: A | Discharge status: Home / Self Care | Payer: Medicare Other | Source: Ambulatory Visit | Attending: Obstetrics and Gynecology | Admitting: Obstetrics and Gynecology

## 2021-12-04 DIAGNOSIS — Z1231 Encounter for screening mammogram for malignant neoplasm of breast: Secondary | ICD-10-CM

## 2022-01-06 ENCOUNTER — Other Ambulatory Visit: Payer: Self-pay

## 2022-01-06 ENCOUNTER — Ambulatory Visit: Payer: Medicare Other | Admitting: Dermatology

## 2022-01-06 DIAGNOSIS — D099 Carcinoma in situ, unspecified: Secondary | ICD-10-CM

## 2022-01-06 DIAGNOSIS — L409 Psoriasis, unspecified: Secondary | ICD-10-CM

## 2022-01-06 MED ORDER — IMIQUIMOD 5 % EX CREA: TOPICAL_CREAM | CUTANEOUS | 1 refills | Status: AC

## 2022-01-06 NOTE — Patient Instructions (Signed)
[email or web address] ? ?MEDICINE (PILL) - SORIATANE (Acitretin) ?

## 2022-01-17 ENCOUNTER — Encounter: Payer: Self-pay | Admitting: Dermatology

## 2022-01-17 NOTE — Progress Notes (Signed)
? ?  Follow-Up Visit ?  ?Subjective  ?Allison Lee is a 66 y.o. female who presents for the following: Follow-up (Pt here to f/u on L lower leg. Mild improvement, still itchy and fairly bothersome./Pt has been under stress due to husbands recent diagnosis of liver cancer ). ? ?Itchy rash on legs ?Location:  ?Duration:  ?Quality:  ?Associated Signs/Symptoms: ?Modifying Factors:  ?Severity:  ?Timing: ?Context:  ? ?Objective  ?Well appearing patient in no apparent distress; mood and affect are within normal limits. ?Left Leg, Right Leg ?1 to 2 cm psoriasiform patches.  As she is clobetasol with minimal improvement.  Some spots hypertrophic.  I discussed in some detail essentially all treatment options for this condition.  There is a possibility that the stress related to her husband's recent diagnosis of cancer could be an important exacerbating factor.  We also discussed scratching essentially make psoriasis worse. ? ? ? ?A focused examination was performed including arms, legs, back of scalp, nails. Relevant physical exam findings are noted in the Assessment and Plan. ? ? ?Assessment & Plan  ? ? ?Psoriasis ?Left Leg; Right Leg ? ?She will do some reading [email or web address] on the medicine acetretin as well as alternatives.  She may try the clobetasol covered with a moist wrap for 20 minutes once or twice daily.  Follow-up by MyChart or telephone in 3 to 4 weeks. ? ?Squamous cell carcinoma in situ ? ?Related Medications ?imiquimod (ALDARA) 5 % cream ?Apply topically 3 (three) times a week. ? ? ? ? ? ?I, Lavonna Monarch, MD, have reviewed all documentation for this visit.  The documentation on 01/17/22 for the exam, diagnosis, procedures, and orders are all accurate and complete. ?

## 2022-04-01 ENCOUNTER — Ambulatory Visit: Payer: Medicare Other | Admitting: Dermatology

## 2022-06-29 ENCOUNTER — Ambulatory Visit: Payer: Medicare Other | Admitting: Dermatology

## 2022-06-30 DIAGNOSIS — D485 Neoplasm of uncertain behavior of skin: Secondary | ICD-10-CM | POA: Diagnosis not present

## 2022-06-30 DIAGNOSIS — Z08 Encounter for follow-up examination after completed treatment for malignant neoplasm: Secondary | ICD-10-CM | POA: Diagnosis not present

## 2022-06-30 DIAGNOSIS — C44719 Basal cell carcinoma of skin of left lower limb, including hip: Secondary | ICD-10-CM | POA: Diagnosis not present

## 2022-06-30 DIAGNOSIS — Z85828 Personal history of other malignant neoplasm of skin: Secondary | ICD-10-CM | POA: Diagnosis not present

## 2022-06-30 DIAGNOSIS — D1801 Hemangioma of skin and subcutaneous tissue: Secondary | ICD-10-CM | POA: Diagnosis not present

## 2022-06-30 DIAGNOSIS — L814 Other melanin hyperpigmentation: Secondary | ICD-10-CM | POA: Diagnosis not present

## 2022-06-30 DIAGNOSIS — L821 Other seborrheic keratosis: Secondary | ICD-10-CM | POA: Diagnosis not present

## 2022-07-15 DIAGNOSIS — C44719 Basal cell carcinoma of skin of left lower limb, including hip: Secondary | ICD-10-CM | POA: Diagnosis not present

## 2022-08-01 DIAGNOSIS — Z23 Encounter for immunization: Secondary | ICD-10-CM | POA: Diagnosis not present

## 2022-10-13 DIAGNOSIS — G8929 Other chronic pain: Secondary | ICD-10-CM | POA: Diagnosis not present

## 2022-10-13 DIAGNOSIS — R634 Abnormal weight loss: Secondary | ICD-10-CM | POA: Diagnosis not present

## 2022-10-13 DIAGNOSIS — Z23 Encounter for immunization: Secondary | ICD-10-CM | POA: Diagnosis not present

## 2022-10-13 DIAGNOSIS — M810 Age-related osteoporosis without current pathological fracture: Secondary | ICD-10-CM | POA: Diagnosis not present

## 2022-10-13 DIAGNOSIS — M5442 Lumbago with sciatica, left side: Secondary | ICD-10-CM | POA: Diagnosis not present

## 2022-10-13 DIAGNOSIS — J449 Chronic obstructive pulmonary disease, unspecified: Secondary | ICD-10-CM | POA: Diagnosis not present

## 2022-12-31 DIAGNOSIS — C44329 Squamous cell carcinoma of skin of other parts of face: Secondary | ICD-10-CM | POA: Diagnosis not present

## 2022-12-31 DIAGNOSIS — L814 Other melanin hyperpigmentation: Secondary | ICD-10-CM | POA: Diagnosis not present

## 2022-12-31 DIAGNOSIS — Z85828 Personal history of other malignant neoplasm of skin: Secondary | ICD-10-CM | POA: Diagnosis not present

## 2022-12-31 DIAGNOSIS — L821 Other seborrheic keratosis: Secondary | ICD-10-CM | POA: Diagnosis not present

## 2022-12-31 DIAGNOSIS — D485 Neoplasm of uncertain behavior of skin: Secondary | ICD-10-CM | POA: Diagnosis not present

## 2022-12-31 DIAGNOSIS — Z08 Encounter for follow-up examination after completed treatment for malignant neoplasm: Secondary | ICD-10-CM | POA: Diagnosis not present

## 2022-12-31 DIAGNOSIS — D1801 Hemangioma of skin and subcutaneous tissue: Secondary | ICD-10-CM | POA: Diagnosis not present

## 2023-01-06 ENCOUNTER — Other Ambulatory Visit: Payer: Self-pay | Admitting: Internal Medicine

## 2023-01-06 DIAGNOSIS — Z1231 Encounter for screening mammogram for malignant neoplasm of breast: Secondary | ICD-10-CM

## 2023-01-07 ENCOUNTER — Ambulatory Visit
Admission: RE | Admit: 2023-01-07 | Discharge: 2023-01-07 | Disposition: A | Discharge status: Home / Self Care | Payer: Medicare Other | Source: Ambulatory Visit | Attending: Internal Medicine | Admitting: Internal Medicine

## 2023-01-07 DIAGNOSIS — Z1231 Encounter for screening mammogram for malignant neoplasm of breast: Secondary | ICD-10-CM

## 2023-01-15 DIAGNOSIS — D0439 Carcinoma in situ of skin of other parts of face: Secondary | ICD-10-CM | POA: Diagnosis not present

## 2023-06-29 DIAGNOSIS — K219 Gastro-esophageal reflux disease without esophagitis: Secondary | ICD-10-CM | POA: Diagnosis not present

## 2023-06-29 DIAGNOSIS — M810 Age-related osteoporosis without current pathological fracture: Secondary | ICD-10-CM | POA: Diagnosis not present

## 2023-06-29 DIAGNOSIS — G8929 Other chronic pain: Secondary | ICD-10-CM | POA: Diagnosis not present

## 2023-06-29 DIAGNOSIS — C4490 Unspecified malignant neoplasm of skin, unspecified: Secondary | ICD-10-CM | POA: Diagnosis not present

## 2023-06-29 DIAGNOSIS — Z136 Encounter for screening for cardiovascular disorders: Secondary | ICD-10-CM | POA: Diagnosis not present

## 2023-06-29 DIAGNOSIS — R609 Edema, unspecified: Secondary | ICD-10-CM | POA: Diagnosis not present

## 2023-06-29 DIAGNOSIS — R634 Abnormal weight loss: Secondary | ICD-10-CM | POA: Diagnosis not present

## 2023-06-29 DIAGNOSIS — J449 Chronic obstructive pulmonary disease, unspecified: Secondary | ICD-10-CM | POA: Diagnosis not present

## 2023-06-29 DIAGNOSIS — Z0000 Encounter for general adult medical examination without abnormal findings: Secondary | ICD-10-CM | POA: Diagnosis not present

## 2023-06-29 DIAGNOSIS — M5442 Lumbago with sciatica, left side: Secondary | ICD-10-CM | POA: Diagnosis not present

## 2023-06-29 DIAGNOSIS — Z13220 Encounter for screening for lipoid disorders: Secondary | ICD-10-CM | POA: Diagnosis not present

## 2023-07-17 DIAGNOSIS — Z23 Encounter for immunization: Secondary | ICD-10-CM | POA: Diagnosis not present

## 2023-08-23 DIAGNOSIS — Z860101 Personal history of adenomatous and serrated colon polyps: Secondary | ICD-10-CM | POA: Diagnosis not present

## 2023-09-07 DIAGNOSIS — M5442 Lumbago with sciatica, left side: Secondary | ICD-10-CM | POA: Diagnosis not present

## 2023-09-07 DIAGNOSIS — G8929 Other chronic pain: Secondary | ICD-10-CM | POA: Diagnosis not present

## 2023-09-07 DIAGNOSIS — E46 Unspecified protein-calorie malnutrition: Secondary | ICD-10-CM | POA: Diagnosis not present

## 2023-09-07 DIAGNOSIS — M810 Age-related osteoporosis without current pathological fracture: Secondary | ICD-10-CM | POA: Diagnosis not present

## 2023-09-07 DIAGNOSIS — Z7289 Other problems related to lifestyle: Secondary | ICD-10-CM | POA: Diagnosis not present

## 2023-09-07 DIAGNOSIS — J449 Chronic obstructive pulmonary disease, unspecified: Secondary | ICD-10-CM | POA: Diagnosis not present

## 2023-09-23 DIAGNOSIS — L821 Other seborrheic keratosis: Secondary | ICD-10-CM | POA: Diagnosis not present

## 2023-09-23 DIAGNOSIS — L814 Other melanin hyperpigmentation: Secondary | ICD-10-CM | POA: Diagnosis not present

## 2023-09-23 DIAGNOSIS — L570 Actinic keratosis: Secondary | ICD-10-CM | POA: Diagnosis not present

## 2023-09-23 DIAGNOSIS — D485 Neoplasm of uncertain behavior of skin: Secondary | ICD-10-CM | POA: Diagnosis not present

## 2023-09-23 DIAGNOSIS — Z08 Encounter for follow-up examination after completed treatment for malignant neoplasm: Secondary | ICD-10-CM | POA: Diagnosis not present

## 2023-09-23 DIAGNOSIS — D225 Melanocytic nevi of trunk: Secondary | ICD-10-CM | POA: Diagnosis not present

## 2023-09-23 DIAGNOSIS — Z85828 Personal history of other malignant neoplasm of skin: Secondary | ICD-10-CM | POA: Diagnosis not present

## 2024-03-22 DIAGNOSIS — Z85828 Personal history of other malignant neoplasm of skin: Secondary | ICD-10-CM | POA: Diagnosis not present

## 2024-03-22 DIAGNOSIS — Z08 Encounter for follow-up examination after completed treatment for malignant neoplasm: Secondary | ICD-10-CM | POA: Diagnosis not present

## 2024-03-22 DIAGNOSIS — L821 Other seborrheic keratosis: Secondary | ICD-10-CM | POA: Diagnosis not present

## 2024-03-22 DIAGNOSIS — L565 Disseminated superficial actinic porokeratosis (DSAP): Secondary | ICD-10-CM | POA: Diagnosis not present

## 2024-03-22 DIAGNOSIS — L814 Other melanin hyperpigmentation: Secondary | ICD-10-CM | POA: Diagnosis not present

## 2024-03-22 DIAGNOSIS — D1801 Hemangioma of skin and subcutaneous tissue: Secondary | ICD-10-CM | POA: Diagnosis not present

## 2024-06-27 DIAGNOSIS — Z7289 Other problems related to lifestyle: Secondary | ICD-10-CM | POA: Diagnosis not present

## 2024-06-27 DIAGNOSIS — Z0000 Encounter for general adult medical examination without abnormal findings: Secondary | ICD-10-CM | POA: Diagnosis not present

## 2024-06-27 DIAGNOSIS — M5442 Lumbago with sciatica, left side: Secondary | ICD-10-CM | POA: Diagnosis not present

## 2024-06-27 DIAGNOSIS — M810 Age-related osteoporosis without current pathological fracture: Secondary | ICD-10-CM | POA: Diagnosis not present

## 2024-06-27 DIAGNOSIS — J449 Chronic obstructive pulmonary disease, unspecified: Secondary | ICD-10-CM | POA: Diagnosis not present

## 2024-06-27 DIAGNOSIS — H02403 Unspecified ptosis of bilateral eyelids: Secondary | ICD-10-CM | POA: Diagnosis not present

## 2024-06-27 DIAGNOSIS — Z136 Encounter for screening for cardiovascular disorders: Secondary | ICD-10-CM | POA: Diagnosis not present

## 2024-06-27 DIAGNOSIS — Z23 Encounter for immunization: Secondary | ICD-10-CM | POA: Diagnosis not present

## 2024-06-27 DIAGNOSIS — Z681 Body mass index (BMI) 19 or less, adult: Secondary | ICD-10-CM | POA: Diagnosis not present

## 2024-06-27 DIAGNOSIS — E46 Unspecified protein-calorie malnutrition: Secondary | ICD-10-CM | POA: Diagnosis not present

## 2024-06-27 DIAGNOSIS — C4490 Unspecified malignant neoplasm of skin, unspecified: Secondary | ICD-10-CM | POA: Diagnosis not present

## 2024-06-27 DIAGNOSIS — G8929 Other chronic pain: Secondary | ICD-10-CM | POA: Diagnosis not present

## 2024-07-12 ENCOUNTER — Encounter: Payer: Self-pay | Admitting: *Deleted

## 2024-07-20 ENCOUNTER — Other Ambulatory Visit: Payer: Self-pay | Admitting: Internal Medicine

## 2024-07-20 DIAGNOSIS — Z1231 Encounter for screening mammogram for malignant neoplasm of breast: Secondary | ICD-10-CM

## 2024-08-21 ENCOUNTER — Telehealth: Payer: Self-pay

## 2024-08-21 DIAGNOSIS — Z122 Encounter for screening for malignant neoplasm of respiratory organs: Secondary | ICD-10-CM

## 2024-08-21 DIAGNOSIS — Z87891 Personal history of nicotine dependence: Secondary | ICD-10-CM

## 2024-08-21 NOTE — Telephone Encounter (Signed)
 Lung Cancer Screening Narrative/Criteria Questionnaire (Cigarette Smokers Only- No Cigars/Pipes/vapes)  Allison Lee   SDMV:08/28/2024 at 11:00 am Allison Lee        07/29/1956               LDCT: 08/29/2024 at 11:40 am at [facility]    68 y.o.   Phone: [phone]  Lung Screening Narrative (confirm age 60-77 yrs Medicare / 50-80 yrs Private pay insurance)   Insurance information: [employer] Medicare   Referring Provider: Vernon, MD   This screening involves an initial phone call with a team member from our program. It is called a shared decision making visit. The initial meeting is required by  insurance and Medicare to make sure you understand the program. This appointment takes about 15-20 minutes to complete. You will complete the screening scan at your scheduled date/time.  This scan takes about 5-10 minutes to complete. You can eat and drink normally before and after the scan.  Criteria questions for Lung Cancer Screening:   Are you a current or former smoker? Former Age began smoking: 20   If you are a former smoker, what year did you quit smoking? Quit 08/2017 (within 15 yrs)   To calculate your smoking history, I need an accurate estimate of how many packs of cigarettes you smoked per day and for how many years. (Not just the number of PPD you are now smoking)   Years smoking 40 x Packs per day 1 = Pack years 40   (at least 20 pack yrs)   (Make sure they understand that we need to know how much they have smoked in the past, not just the number of PPD they are smoking now)  Do you have a personal history of cancer?  Yes - (type and when diagnosed - 5 yrs cancer free) Skin Cancer    Do you have a family history of cancer? Yes  (cancer type and and relative) Brother small cell carcinoma. Father had liver cancer. Additional Brother had liver cancer.  Are you coughing up blood?  No  Have you had unexplained weight loss of 15 lbs or more in the last 6 months? No  It looks like you meet all  criteria.  When would be a good time for us  to schedule you for this screening?   Additional information: N/A

## 2024-08-28 ENCOUNTER — Encounter: Payer: Self-pay | Admitting: *Deleted

## 2024-08-28 ENCOUNTER — Ambulatory Visit: Admitting: *Deleted

## 2024-08-28 DIAGNOSIS — Z87891 Personal history of nicotine dependence: Secondary | ICD-10-CM | POA: Diagnosis not present

## 2024-08-28 NOTE — Patient Instructions (Signed)

## 2024-08-28 NOTE — Progress Notes (Signed)
 Virtual Visit via Telephone Note  I connected with Allison Lee on 08/28/24 at 11:00 AM EDT by telephone and verified that I am speaking with the correct person using two identifiers.  Location: Patient: at home Provider: [address] [place], [place], [address]    I discussed the limitations, risks, security and privacy concerns of performing an evaluation and management service by telephone and the availability of in person appointments. I also discussed with the patient that there may be a patient responsible charge related to this service. The patient expressed understanding and agreed to proceed.   Shared Decision Making Visit Lung Cancer Screening Program [phone])   Eligibility: Age 68 y.o. Pack Years Smoking History Calculation 40 (# packs/per year x # years smoked) Recent History of coughing up blood  no Unexplained weight loss? no ( >Than 15 pounds within the last 6 months ) Prior History Lung / other cancer no (Diagnosis within the last 5 years already requiring surveillance chest CT Scans). Smoking Status Former Smoker Former Smokers: Years since quit: 7 years  Quit Date: 08/31/2017  Visit Components: Discussion included one or more decision making aids. yes Discussion included risk/benefits of screening. yes Discussion included potential follow up diagnostic testing for abnormal scans. yes Discussion included meaning and risk of over diagnosis. yes Discussion included meaning and risk of False Positives. yes Discussion included meaning of total radiation exposure. yes  Counseling Included: Importance of adherence to annual lung cancer LDCT screening. yes Impact of comorbidities on ability to participate in the program. yes Ability and willingness to under diagnostic treatment. yes  Smoking Cessation Counseling: Current Smokers:  Discussed importance of smoking cessation. yes Information about tobacco cessation classes and interventions provided to  patient. yes Patient provided with ticket for LDCT Scan. no Symptomatic Patient. no   Diagnosis Code: Tobacco Use Z72.0 Asymptomatic Patient yes  Counseled patient 4 minutes regarding tobacco use.   Former Smokers:  Discussed the importance of maintaining cigarette abstinence. yes Diagnosis Code: Personal History of Nicotine  Dependence. S12.108 Information about tobacco cessation classes and interventions provided to patient. Yes Patient provided with ticket for LDCT Scan. no Written Order for Lung Cancer Screening with LDCT placed in Epic. Yes (CT Chest Lung Cancer Screening Low Dose W/O CM) [id number] Z12.2-Screening of respiratory organs Z87.891-Personal history of nicotine  dependence   Laneta Speaks, RN

## 2024-08-29 ENCOUNTER — Inpatient Hospital Stay: Admission: RE | Admit: 2024-08-29 | Source: Ambulatory Visit

## 2024-09-04 ENCOUNTER — Telehealth: Payer: Self-pay

## 2024-09-04 NOTE — Telephone Encounter (Signed)
 LVM to reschedule missed CT form last week.

## 2024-09-18 ENCOUNTER — Encounter: Payer: Self-pay | Admitting: Emergency Medicine

## 2024-09-18 NOTE — Telephone Encounter (Signed)
 Letter printed and mailed to patient. Previous Mychart message has not been read yet by patient.

## 2024-10-05 ENCOUNTER — Ambulatory Visit
Admission: RE | Admit: 2024-10-05 | Discharge: 2024-10-05 | Disposition: A | Source: Ambulatory Visit | Attending: Internal Medicine

## 2024-10-05 DIAGNOSIS — Z1231 Encounter for screening mammogram for malignant neoplasm of breast: Secondary | ICD-10-CM

## 2024-10-09 ENCOUNTER — Inpatient Hospital Stay
Admission: RE | Admit: 2024-10-09 | Discharge: 2024-10-09 | Disposition: A | Source: Ambulatory Visit | Attending: Acute Care | Admitting: Acute Care

## 2024-10-09 DIAGNOSIS — Z122 Encounter for screening for malignant neoplasm of respiratory organs: Secondary | ICD-10-CM

## 2024-10-09 DIAGNOSIS — Z87891 Personal history of nicotine dependence: Secondary | ICD-10-CM

## 2024-10-12 ENCOUNTER — Other Ambulatory Visit: Payer: Self-pay

## 2024-10-12 DIAGNOSIS — Z87891 Personal history of nicotine dependence: Secondary | ICD-10-CM

## 2024-10-12 DIAGNOSIS — Z122 Encounter for screening for malignant neoplasm of respiratory organs: Secondary | ICD-10-CM

## 2025-01-08 ENCOUNTER — Ambulatory Visit
